# Patient Record
Sex: Female | Born: 1960 | Race: White | Hispanic: No | Marital: Married | Smoking: Former smoker
Health system: Southern US, Community
[De-identification: ages and names within clinical notes are randomized; demographics above are authoritative.]

## PROBLEM LIST (undated history)

## (undated) DIAGNOSIS — M549 Dorsalgia, unspecified: Secondary | ICD-10-CM

## (undated) DIAGNOSIS — J45909 Unspecified asthma, uncomplicated: Secondary | ICD-10-CM

## (undated) DIAGNOSIS — J449 Chronic obstructive pulmonary disease, unspecified: Secondary | ICD-10-CM

## (undated) DIAGNOSIS — I6529 Occlusion and stenosis of unspecified carotid artery: Secondary | ICD-10-CM

## (undated) DIAGNOSIS — E039 Hypothyroidism, unspecified: Secondary | ICD-10-CM

## (undated) DIAGNOSIS — I10 Essential (primary) hypertension: Secondary | ICD-10-CM

## (undated) DIAGNOSIS — E785 Hyperlipidemia, unspecified: Secondary | ICD-10-CM

## (undated) DIAGNOSIS — I639 Cerebral infarction, unspecified: Secondary | ICD-10-CM

## (undated) HISTORY — PX: CYST EXCISION: SHX5701

## (undated) HISTORY — PX: CARPAL TUNNEL RELEASE: SHX101

## (undated) HISTORY — PX: CARDIAC CATHETERIZATION: SHX172

## (undated) HISTORY — DX: Occlusion and stenosis of unspecified carotid artery: I65.29

## (undated) HISTORY — PX: CAROTID ENDARTERECTOMY: SUR193

---

## 2009-11-19 ENCOUNTER — Ambulatory Visit (HOSPITAL_COMMUNITY): Admission: RE | Admit: 2009-11-19 | Discharge: 2009-11-19 | Payer: Self-pay | Admitting: Orthopedic Surgery

## 2011-03-17 LAB — COMPREHENSIVE METABOLIC PANEL
Albumin: 3.9 g/dL (ref 3.5–5.2)
Alkaline Phosphatase: 76 U/L (ref 39–117)
BUN: 7 mg/dL (ref 6–23)
CO2: 24 mEq/L (ref 19–32)
Chloride: 103 mEq/L (ref 96–112)
Creatinine, Ser: 0.76 mg/dL (ref 0.4–1.2)
GFR calc non Af Amer: >60 mL/min (ref >60)
Glucose, Bld: 88 mg/dL (ref 70–99)
Total Bilirubin: 0.9 mg/dL (ref 0.3–1.2)

## 2011-03-17 LAB — URINALYSIS, ROUTINE W REFLEX MICROSCOPIC
Bilirubin Urine: NEGATIVE
Hgb urine dipstick: NEGATIVE
Ketones, ur: NEGATIVE mg/dL
Nitrite: NEGATIVE
Urobilinogen, UA: 0.2 mg/dL (ref 0.0–1.0)

## 2011-03-17 LAB — URINE CULTURE

## 2011-03-17 LAB — DIFFERENTIAL
Basophils Absolute: 0.1 10*3/uL (ref 0.0–0.1)
Basophils Relative: 1 % (ref 0–1)
Eosinophils Absolute: 0.2 10*3/uL (ref 0.0–0.7)
Neutro Abs: 5.4 10*3/uL (ref 1.7–7.7)
Neutrophils Relative %: 67 % (ref 43–77)

## 2011-03-17 LAB — URINE MICROSCOPIC-ADD ON

## 2011-03-17 LAB — PROTIME-INR: INR: 0.94 (ref 0.00–1.49)

## 2011-03-17 LAB — CBC
HCT: 42.4 % (ref 36.0–46.0)
Hemoglobin: 14.4 g/dL (ref 12.0–15.0)
RBC: 4.61 MIL/uL (ref 3.87–5.11)
RDW: 15.6 % — ABNORMAL HIGH (ref 11.5–15.5)

## 2011-03-17 LAB — APTT: aPTT: 36 seconds (ref 24–37)

## 2016-02-03 DIAGNOSIS — J441 Chronic obstructive pulmonary disease with (acute) exacerbation: Secondary | ICD-10-CM | POA: Insufficient documentation

## 2016-02-03 DIAGNOSIS — E890 Postprocedural hypothyroidism: Secondary | ICD-10-CM | POA: Insufficient documentation

## 2016-02-03 DIAGNOSIS — E039 Hypothyroidism, unspecified: Secondary | ICD-10-CM | POA: Diagnosis present

## 2016-02-03 HISTORY — DX: Postprocedural hypothyroidism: E89.0

## 2016-02-03 HISTORY — DX: Chronic obstructive pulmonary disease with (acute) exacerbation: J44.1

## 2016-02-03 HISTORY — DX: Hypothyroidism, unspecified: E03.9

## 2016-07-09 DIAGNOSIS — G4730 Sleep apnea, unspecified: Secondary | ICD-10-CM

## 2016-07-09 DIAGNOSIS — M1990 Unspecified osteoarthritis, unspecified site: Secondary | ICD-10-CM | POA: Insufficient documentation

## 2016-07-09 DIAGNOSIS — R251 Tremor, unspecified: Secondary | ICD-10-CM | POA: Insufficient documentation

## 2016-07-09 DIAGNOSIS — R7309 Other abnormal glucose: Secondary | ICD-10-CM | POA: Insufficient documentation

## 2016-07-09 DIAGNOSIS — Z720 Tobacco use: Secondary | ICD-10-CM | POA: Diagnosis present

## 2016-07-09 DIAGNOSIS — E7849 Other hyperlipidemia: Secondary | ICD-10-CM | POA: Diagnosis present

## 2016-07-09 DIAGNOSIS — G4700 Insomnia, unspecified: Secondary | ICD-10-CM

## 2016-07-09 HISTORY — DX: Tremor, unspecified: R25.1

## 2016-07-09 HISTORY — DX: Unspecified osteoarthritis, unspecified site: M19.90

## 2016-07-09 HISTORY — DX: Other abnormal glucose: R73.09

## 2016-07-09 HISTORY — DX: Insomnia, unspecified: G47.00

## 2016-07-09 HISTORY — DX: Tobacco use: Z72.0

## 2016-07-09 HISTORY — DX: Other hyperlipidemia: E78.49

## 2016-07-09 HISTORY — DX: Sleep apnea, unspecified: G47.30

## 2016-10-12 DIAGNOSIS — F419 Anxiety disorder, unspecified: Secondary | ICD-10-CM

## 2016-10-12 HISTORY — DX: Anxiety disorder, unspecified: F41.9

## 2016-10-23 DIAGNOSIS — I10 Essential (primary) hypertension: Secondary | ICD-10-CM

## 2016-10-23 HISTORY — DX: Essential (primary) hypertension: I10

## 2016-10-29 DIAGNOSIS — B370 Candidal stomatitis: Secondary | ICD-10-CM | POA: Insufficient documentation

## 2016-10-29 DIAGNOSIS — J029 Acute pharyngitis, unspecified: Secondary | ICD-10-CM | POA: Insufficient documentation

## 2016-10-29 HISTORY — DX: Acute pharyngitis, unspecified: J02.9

## 2016-10-29 HISTORY — DX: Candidal stomatitis: B37.0

## 2018-01-19 DIAGNOSIS — R002 Palpitations: Secondary | ICD-10-CM

## 2018-12-14 HISTORY — PX: BREAST BIOPSY: SHX20

## 2018-12-15 ENCOUNTER — Other Ambulatory Visit: Payer: Self-pay | Admitting: Gerontology

## 2018-12-15 DIAGNOSIS — N6310 Unspecified lump in the right breast, unspecified quadrant: Secondary | ICD-10-CM

## 2018-12-15 DIAGNOSIS — N6489 Other specified disorders of breast: Secondary | ICD-10-CM

## 2018-12-15 DIAGNOSIS — N6320 Unspecified lump in the left breast, unspecified quadrant: Secondary | ICD-10-CM

## 2018-12-28 ENCOUNTER — Ambulatory Visit
Admission: RE | Admit: 2018-12-28 | Discharge: 2018-12-28 | Disposition: A | Discharge status: Home / Self Care | Payer: Medicare Other | Source: Ambulatory Visit | Attending: Gerontology | Admitting: Gerontology

## 2018-12-28 ENCOUNTER — Other Ambulatory Visit: Payer: Self-pay | Admitting: Gerontology

## 2018-12-28 DIAGNOSIS — N6320 Unspecified lump in the left breast, unspecified quadrant: Secondary | ICD-10-CM

## 2018-12-28 DIAGNOSIS — N6489 Other specified disorders of breast: Secondary | ICD-10-CM

## 2018-12-28 DIAGNOSIS — C50412 Malignant neoplasm of upper-outer quadrant of left female breast: Secondary | ICD-10-CM | POA: Insufficient documentation

## 2018-12-28 DIAGNOSIS — N6310 Unspecified lump in the right breast, unspecified quadrant: Secondary | ICD-10-CM

## 2018-12-28 DIAGNOSIS — C50919 Malignant neoplasm of unspecified site of unspecified female breast: Secondary | ICD-10-CM

## 2018-12-28 HISTORY — DX: Malignant neoplasm of upper-outer quadrant of left female breast: C50.412

## 2018-12-28 HISTORY — DX: Malignant neoplasm of unspecified site of unspecified female breast: C50.919

## 2018-12-29 DIAGNOSIS — F17209 Nicotine dependence, unspecified, with unspecified nicotine-induced disorders: Secondary | ICD-10-CM

## 2018-12-29 DIAGNOSIS — J449 Chronic obstructive pulmonary disease, unspecified: Secondary | ICD-10-CM

## 2018-12-29 DIAGNOSIS — I6389 Other cerebral infarction: Secondary | ICD-10-CM

## 2018-12-29 DIAGNOSIS — I10 Essential (primary) hypertension: Secondary | ICD-10-CM

## 2018-12-29 DIAGNOSIS — E039 Hypothyroidism, unspecified: Secondary | ICD-10-CM

## 2018-12-29 DIAGNOSIS — E785 Hyperlipidemia, unspecified: Secondary | ICD-10-CM

## 2018-12-29 DIAGNOSIS — I639 Cerebral infarction, unspecified: Secondary | ICD-10-CM

## 2018-12-29 DIAGNOSIS — F329 Major depressive disorder, single episode, unspecified: Secondary | ICD-10-CM

## 2018-12-30 ENCOUNTER — Inpatient Hospital Stay (HOSPITAL_COMMUNITY)
Admission: AD | Admit: 2018-12-30 | Discharge: 2019-01-05 | DRG: 038 | Disposition: A | Discharge status: Home / Self Care | Payer: Medicare Other | Source: Other Acute Inpatient Hospital | Attending: Internal Medicine | Admitting: Internal Medicine

## 2018-12-30 ENCOUNTER — Encounter (HOSPITAL_COMMUNITY): Payer: Self-pay | Admitting: General Practice

## 2018-12-30 ENCOUNTER — Other Ambulatory Visit: Payer: Self-pay

## 2018-12-30 DIAGNOSIS — R7303 Prediabetes: Secondary | ICD-10-CM | POA: Diagnosis present

## 2018-12-30 DIAGNOSIS — G8191 Hemiplegia, unspecified affecting right dominant side: Secondary | ICD-10-CM | POA: Diagnosis present

## 2018-12-30 DIAGNOSIS — F17210 Nicotine dependence, cigarettes, uncomplicated: Secondary | ICD-10-CM | POA: Diagnosis present

## 2018-12-30 DIAGNOSIS — E890 Postprocedural hypothyroidism: Secondary | ICD-10-CM | POA: Diagnosis present

## 2018-12-30 DIAGNOSIS — Z823 Family history of stroke: Secondary | ICD-10-CM

## 2018-12-30 DIAGNOSIS — D649 Anemia, unspecified: Secondary | ICD-10-CM | POA: Diagnosis present

## 2018-12-30 DIAGNOSIS — I25119 Atherosclerotic heart disease of native coronary artery with unspecified angina pectoris: Secondary | ICD-10-CM | POA: Diagnosis not present

## 2018-12-30 DIAGNOSIS — R4701 Aphasia: Secondary | ICD-10-CM | POA: Diagnosis present

## 2018-12-30 DIAGNOSIS — R001 Bradycardia, unspecified: Secondary | ICD-10-CM | POA: Diagnosis present

## 2018-12-30 DIAGNOSIS — I639 Cerebral infarction, unspecified: Secondary | ICD-10-CM | POA: Diagnosis present

## 2018-12-30 DIAGNOSIS — I252 Old myocardial infarction: Secondary | ICD-10-CM

## 2018-12-30 DIAGNOSIS — E039 Hypothyroidism, unspecified: Secondary | ICD-10-CM | POA: Diagnosis not present

## 2018-12-30 DIAGNOSIS — Z9114 Patient's other noncompliance with medication regimen: Secondary | ICD-10-CM | POA: Diagnosis not present

## 2018-12-30 DIAGNOSIS — I2510 Atherosclerotic heart disease of native coronary artery without angina pectoris: Secondary | ICD-10-CM | POA: Diagnosis present

## 2018-12-30 DIAGNOSIS — I63132 Cerebral infarction due to embolism of left carotid artery: Secondary | ICD-10-CM | POA: Diagnosis present

## 2018-12-30 DIAGNOSIS — Z6841 Body Mass Index (BMI) 40.0 and over, adult: Secondary | ICD-10-CM

## 2018-12-30 DIAGNOSIS — Z79899 Other long term (current) drug therapy: Secondary | ICD-10-CM

## 2018-12-30 DIAGNOSIS — E871 Hypo-osmolality and hyponatremia: Secondary | ICD-10-CM

## 2018-12-30 DIAGNOSIS — F329 Major depressive disorder, single episode, unspecified: Secondary | ICD-10-CM | POA: Diagnosis present

## 2018-12-30 DIAGNOSIS — F419 Anxiety disorder, unspecified: Secondary | ICD-10-CM | POA: Diagnosis present

## 2018-12-30 DIAGNOSIS — I6522 Occlusion and stenosis of left carotid artery: Secondary | ICD-10-CM | POA: Diagnosis not present

## 2018-12-30 DIAGNOSIS — R471 Dysarthria and anarthria: Secondary | ICD-10-CM | POA: Diagnosis present

## 2018-12-30 DIAGNOSIS — N179 Acute kidney failure, unspecified: Secondary | ICD-10-CM

## 2018-12-30 DIAGNOSIS — I6523 Occlusion and stenosis of bilateral carotid arteries: Secondary | ICD-10-CM | POA: Diagnosis not present

## 2018-12-30 DIAGNOSIS — E7439 Other disorders of intestinal carbohydrate absorption: Secondary | ICD-10-CM | POA: Diagnosis present

## 2018-12-30 DIAGNOSIS — R29705 NIHSS score 5: Secondary | ICD-10-CM | POA: Diagnosis present

## 2018-12-30 DIAGNOSIS — I739 Peripheral vascular disease, unspecified: Secondary | ICD-10-CM | POA: Diagnosis present

## 2018-12-30 DIAGNOSIS — I10 Essential (primary) hypertension: Secondary | ICD-10-CM | POA: Diagnosis present

## 2018-12-30 DIAGNOSIS — Z8673 Personal history of transient ischemic attack (TIA), and cerebral infarction without residual deficits: Secondary | ICD-10-CM

## 2018-12-30 DIAGNOSIS — Z01818 Encounter for other preprocedural examination: Secondary | ICD-10-CM

## 2018-12-30 DIAGNOSIS — Z720 Tobacco use: Secondary | ICD-10-CM | POA: Diagnosis present

## 2018-12-30 DIAGNOSIS — I6389 Other cerebral infarction: Secondary | ICD-10-CM | POA: Diagnosis not present

## 2018-12-30 DIAGNOSIS — K219 Gastro-esophageal reflux disease without esophagitis: Secondary | ICD-10-CM | POA: Diagnosis present

## 2018-12-30 DIAGNOSIS — Z833 Family history of diabetes mellitus: Secondary | ICD-10-CM

## 2018-12-30 DIAGNOSIS — J449 Chronic obstructive pulmonary disease, unspecified: Secondary | ICD-10-CM | POA: Diagnosis present

## 2018-12-30 DIAGNOSIS — Z79890 Hormone replacement therapy (postmenopausal): Secondary | ICD-10-CM

## 2018-12-30 DIAGNOSIS — F17209 Nicotine dependence, unspecified, with unspecified nicotine-induced disorders: Secondary | ICD-10-CM | POA: Diagnosis not present

## 2018-12-30 DIAGNOSIS — C50919 Malignant neoplasm of unspecified site of unspecified female breast: Secondary | ICD-10-CM | POA: Diagnosis present

## 2018-12-30 DIAGNOSIS — E7849 Other hyperlipidemia: Secondary | ICD-10-CM | POA: Diagnosis present

## 2018-12-30 DIAGNOSIS — I63232 Cerebral infarction due to unspecified occlusion or stenosis of left carotid arteries: Secondary | ICD-10-CM | POA: Diagnosis not present

## 2018-12-30 HISTORY — DX: Hyperlipidemia, unspecified: E78.5

## 2018-12-30 HISTORY — DX: Chronic obstructive pulmonary disease, unspecified: J44.9

## 2018-12-30 HISTORY — DX: Essential (primary) hypertension: I10

## 2018-12-30 HISTORY — DX: Acute kidney failure, unspecified: N17.9

## 2018-12-30 HISTORY — DX: Cerebral infarction, unspecified: I63.9

## 2018-12-30 HISTORY — DX: Hypothyroidism, unspecified: E03.9

## 2018-12-30 HISTORY — DX: Unspecified asthma, uncomplicated: J45.909

## 2018-12-30 HISTORY — DX: Hypo-osmolality and hyponatremia: E87.1

## 2018-12-30 HISTORY — DX: Dorsalgia, unspecified: M54.9

## 2018-12-30 LAB — CBC WITH DIFFERENTIAL/PLATELET
Abs Immature Granulocytes: 0.09 10*3/uL — ABNORMAL HIGH (ref 0.00–0.07)
Basophils Absolute: 0.1 10*3/uL (ref 0.0–0.1)
Basophils Relative: 1 %
Eosinophils Absolute: 0.1 10*3/uL (ref 0.0–0.5)
Eosinophils Relative: 1 %
HCT: 41 % (ref 36.0–46.0)
Hemoglobin: 13.2 g/dL (ref 12.0–15.0)
Immature Granulocytes: 1 %
Lymphocytes Relative: 19 %
Lymphs Abs: 2 10*3/uL (ref 0.7–4.0)
MCH: 29.7 pg (ref 26.0–34.0)
MCHC: 32.2 g/dL (ref 30.0–36.0)
MCV: 92.3 fL (ref 80.0–100.0)
Monocytes Absolute: 0.6 10*3/uL (ref 0.1–1.0)
Monocytes Relative: 5 %
Neutro Abs: 7.5 10*3/uL (ref 1.7–7.7)
Neutrophils Relative %: 73 %
Platelets: 249 10*3/uL (ref 150–400)
RBC: 4.44 MIL/uL (ref 3.87–5.11)
RDW: 14.6 % (ref 11.5–15.5)
WBC: 10.2 10*3/uL (ref 4.0–10.5)
nRBC: 0 % (ref 0.0–0.2)

## 2018-12-30 LAB — COMPREHENSIVE METABOLIC PANEL
ALT: 21 U/L (ref 0–44)
AST: 24 U/L (ref 15–41)
Albumin: 3.4 g/dL — ABNORMAL LOW (ref 3.5–5.0)
Alkaline Phosphatase: 54 U/L (ref 38–126)
Anion gap: 10 (ref 5–15)
BUN: 19 mg/dL (ref 6–20)
CO2: 24 mmol/L (ref 22–32)
Calcium: 8.7 mg/dL — ABNORMAL LOW (ref 8.9–10.3)
Chloride: 98 mmol/L (ref 98–111)
Creatinine, Ser: 1.08 mg/dL — ABNORMAL HIGH (ref 0.44–1.00)
GFR calc Af Amer: >60 mL/min (ref >60)
GFR calc non Af Amer: 57 mL/min — ABNORMAL LOW (ref >60)
Glucose, Bld: 99 mg/dL (ref 70–99)
Potassium: 4.3 mmol/L (ref 3.5–5.1)
Sodium: 132 mmol/L — ABNORMAL LOW (ref 135–145)
Total Bilirubin: 0.6 mg/dL (ref 0.3–1.2)
Total Protein: 7.1 g/dL (ref 6.5–8.1)

## 2018-12-30 LAB — PROTIME-INR
INR: 1.02
Prothrombin Time: 13.3 seconds (ref 11.4–15.2)

## 2018-12-30 LAB — GLUCOSE, CAPILLARY: Glucose-Capillary: 107 mg/dL — ABNORMAL HIGH (ref 70–99)

## 2018-12-30 LAB — APTT: aPTT: 32 seconds (ref 24–36)

## 2018-12-30 MED ORDER — ACETAMINOPHEN 325 MG PO TABS: 650.0000 mg | ORAL_TABLET | ORAL | Status: DC | PRN

## 2018-12-30 MED ORDER — PANTOPRAZOLE SODIUM 40 MG PO TBEC
40.0000 mg | DELAYED_RELEASE_TABLET | Freq: Every day | ORAL | Status: DC
  Administered 2018-12-30 – 2019-01-03 (×5): 40 mg via ORAL
  Filled 2018-12-30 (×5): qty 1

## 2018-12-30 MED ORDER — STROKE: EARLY STAGES OF RECOVERY BOOK
Freq: Once | Status: AC
  Administered 2018-12-31: 08:00:00
  Filled 2018-12-30: qty 1

## 2018-12-30 MED ORDER — SENNOSIDES-DOCUSATE SODIUM 8.6-50 MG PO TABS
1.0000 | ORAL_TABLET | Freq: Every evening | ORAL | Status: DC | PRN
  Administered 2019-01-01 – 2019-01-05 (×2): 1 via ORAL
  Filled 2018-12-30 (×2): qty 1

## 2018-12-30 MED ORDER — ASPIRIN EC 325 MG PO TBEC
325.0000 mg | DELAYED_RELEASE_TABLET | Freq: Every day | ORAL | Status: DC
  Administered 2018-12-30 – 2019-01-03 (×5): 325 mg via ORAL
  Filled 2018-12-30 (×5): qty 1

## 2018-12-30 MED ORDER — SODIUM CHLORIDE 0.9 % IV SOLN
INTRAVENOUS | Status: DC
  Administered 2018-12-31 – 2019-01-03 (×5): via INTRAVENOUS

## 2018-12-30 MED ORDER — FAMOTIDINE 20 MG PO TABS
40.0000 mg | ORAL_TABLET | Freq: Every day | ORAL | Status: DC
  Administered 2018-12-30 – 2019-01-04 (×6): 40 mg via ORAL
  Filled 2018-12-30 (×6): qty 2

## 2018-12-30 MED ORDER — OXYCODONE-ACETAMINOPHEN 7.5-325 MG PO TABS
1.0000 | ORAL_TABLET | Freq: Four times a day (QID) | ORAL | Status: DC | PRN
  Administered 2019-01-04 – 2019-01-05 (×3): 1 via ORAL
  Filled 2018-12-30 (×3): qty 1

## 2018-12-30 MED ORDER — ATORVASTATIN CALCIUM 80 MG PO TABS
80.0000 mg | ORAL_TABLET | Freq: Every day | ORAL | Status: DC
  Administered 2018-12-30 – 2019-01-05 (×6): 80 mg via ORAL
  Filled 2018-12-30 (×6): qty 1

## 2018-12-30 MED ORDER — SODIUM CHLORIDE 0.9 % IV SOLN
INTRAVENOUS | Status: AC
  Administered 2018-12-30: 21:00:00 via INTRAVENOUS

## 2018-12-30 MED ORDER — ACETAMINOPHEN 650 MG RE SUPP: 650.0000 mg | RECTAL | Status: DC | PRN

## 2018-12-30 MED ORDER — CITALOPRAM HYDROBROMIDE 20 MG PO TABS
20.0000 mg | ORAL_TABLET | Freq: Every day | ORAL | Status: DC
  Administered 2018-12-31 – 2019-01-05 (×5): 20 mg via ORAL
  Filled 2018-12-30 (×2): qty 2
  Filled 2018-12-30: qty 1
  Filled 2018-12-30 (×2): qty 2

## 2018-12-30 MED ORDER — ENOXAPARIN SODIUM 40 MG/0.4ML ~~LOC~~ SOLN
40.0000 mg | SUBCUTANEOUS | Status: DC
  Administered 2018-12-30 – 2019-01-02 (×4): 40 mg via SUBCUTANEOUS
  Filled 2018-12-30 (×4): qty 0.4

## 2018-12-30 MED ORDER — CELECOXIB 200 MG PO CAPS
200.0000 mg | ORAL_CAPSULE | Freq: Two times a day (BID) | ORAL | Status: DC
  Administered 2018-12-30 – 2019-01-03 (×8): 200 mg via ORAL
  Filled 2018-12-30 (×8): qty 1

## 2018-12-30 MED ORDER — MONTELUKAST SODIUM 10 MG PO TABS
10.0000 mg | ORAL_TABLET | Freq: Every day | ORAL | Status: DC
  Administered 2018-12-30 – 2019-01-05 (×6): 10 mg via ORAL
  Filled 2018-12-30 (×6): qty 1

## 2018-12-30 MED ORDER — LEVOTHYROXINE SODIUM 25 MCG PO TABS
25.0000 ug | ORAL_TABLET | Freq: Every day | ORAL | Status: DC
  Administered 2018-12-30 – 2019-01-05 (×7): 25 ug via ORAL
  Filled 2018-12-30 (×7): qty 1

## 2018-12-30 MED ORDER — ACETAMINOPHEN 160 MG/5ML PO SOLN: 650.0000 mg | ORAL | Status: DC | PRN

## 2018-12-30 NOTE — Progress Notes (Signed)
Patient arrived via care link. In bed with side rails up, alarm on, and family in the room. Oriented her to the room and unit.

## 2018-12-30 NOTE — Consult Note (Addendum)
NEURO HOSPITALIST  CONSULT   Requesting Physician: Dr. Steffanie Dunn    Chief Complaint: Stroke  History obtained from:  Family   HPI:                                                                                                                                         Madison Medina is an 58 y.o. female  With PMH HTN, MI, HLD, COPD, asthma, hyperthyroidism (had radiation now hypo) presented to [facility] ED for right arm numbness and weakness. Patient transferred to [facility] for stroke on MRI.  Per family, the patient had a breast biopsy Wednesday and has not been acting right since then. The patient presented to [place] ED for right arm numbness and weakness that started 12/28/2018 about 7:30 or 8 am. Around 9:30 am on 12/29/2018 she developed slurred speech which resolved, but numbness and weakness persisted. Denies CP, SOB, vision changes. Endorses smoking.  Patient is on no anticoagulation and family states she was not taking ASA prior to hospitalization.   Hospital course (from OSH chart):  CTH: no hemorrhage; mild chronic cerebral white matter changes Chest x-ray: unremarkeable MRI: Patchy acute infarcts left cerebral hemisphere with involvement of superficial and deep watershed zones. New abnormal appearance of the left ICA. 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 Right proximal ICA severe 90% stenosis with mixed plaque. Anterior left vertebral artery origin moderate 50% stenosis.  Patent diminutive right vertebral artery.  Diminutive left ICA downstream to bifurcation, stenosis likely due to chronic slow flow. Patent anterior and posterior intracranial circulation.  No large vessel occlusion, aneurysm or significant stenosis is identified.   Date last known well: 12/28/2018 Time last known well:0800 tPA Given: No: outside of window  Modified Rankin: Rankin Score=0 NIHSS:5    Past  Medical History:  Diagnosis Date  . Asthma   . Back pain   . COPD (chronic obstructive pulmonary disease) ([place])   . Hyperlipidemia   . Hypertension   . Hypothyroidism   . Stroke [facility])     Past Surgical History:  Procedure Laterality Date  . BREAST BIOPSY  12/2018   at breast center in [place]   . CARPAL TUNNEL RELEASE    . CYST EXCISION      History reviewed. No pertinent family history.       Social History:  reports that she has been smoking cigarettes. She has a 80.00 pack-year smoking history. She has never used smokeless tobacco. She reports previous alcohol use. She reports that she does not use drugs.  Allergies: Not on File  Medications:  Scheduled: .  stroke: mapping our early stages of recovery book   Does not apply Once  . atorvastatin  80 mg Oral Daily  . celecoxib  200 mg Oral BID  . citalopram  20 mg Oral Daily  . enoxaparin (LOVENOX) injection  40 mg Subcutaneous Q24H  . famotidine  40 mg Oral QHS  . [START ON 12/31/2018] levothyroxine  25 mcg Oral Q0600  . montelukast  10 mg Oral Daily  . pantoprazole  40 mg Oral Daily   Continuous: . sodium chloride     NTI:[id number] **OR** acetaminophen (TYLENOL) oral liquid 160 mg/5 mL **OR** acetaminophen, oxyCODONE-acetaminophen, senna-docusate   ROS:                                                                                                                                        unobtainable from patient due to mental status   General Examination:                                                                                                      Blood pressure (!) 119/56, pulse (!) 55, temperature 97.6 F (36.4 C), temperature source Oral, resp. rate 17, SpO2 95 %.  HEENT-  Normocephalic, no lesions, without obvious abnormality.  Normal external eye and  conjunctiva. Cardiovascular- S1-S2 audible, pulses palpable throughout  Lungs-no rhonchi or wheezing noted, no excessive working breathing.  Saturations within normal limits on RA Abdomen- All 4 quadrants palpated and non tender Extremities- Warm, dry and intact Musculoskeletal-no joint tenderness, deformity or swelling Skin-warm and dry, intact. Bruise on right arm  Neurological Examination Mental Status: Alert, patient answers anything asked with "yes" or "no".   Able to follow some simple commands. Cranial Nerves: II: Blinks to threat bilaterally. PERRL III,IV, VI: Ptosis not present, EOMI  V,VII: Smile symmetric, facial light touch sensation normal bilaterally VIII: hearing intact to voice XI: Head midline XII: midline tongue extension Motor: Right : Upper extremity   4/5  Left:     Upper extremity   5/5  Lower extremity   5/5   Lower extremity   5/5 Tone and bulk:normal tone throughout; no atrophy noted Sensory:light touch intact throughout, bilaterally Deep Tendon Reflexes: 2+ and symmetric biceps and patellae Plantars: Right: downgoing   Left: downgoing Cerebellar: Unable to follow directions to do FNF or HTS Gait: Deferred   Lab Results: Basic Metabolic Panel: No results for input(s): NA, K, CL, CO2, GLUCOSE, BUN, CREATININE, CALCIUM, MG, PHOS in the  last 168 hours.  CBC: No results for input(s): WBC, NEUTROABS, HGB, HCT, MCV, PLT in the last 168 hours.  Lipid Panel: No results for input(s): CHOL, TRIG, HDL, CHOLHDL, VLDL, LDLCALC in the last 168 hours.  CBG: Recent Labs  Lab 12/30/18 1155  GLUCAP 107*    Imaging: No results found.     Laurey Morale, MSN, NP-C [facility] [phone] 12/30/2018, 3:25 PM    Assessment: 58 y.o. female with PMHx HTN, MI, HLD, COPD, asthma and hyperthyroidism (had radiation now hypo) who presented to [facility] ED for right arm numbness and weakness.  1. CTH: negative for hemorrhage.  2. CTA head and neck: no  LVO. Multifocal stenoses noted 3. MRI: Acute infarcts left cerebral hemisphere, with involvement of superficial and deep watershed zones.  4. TPA not given d/t patient presenting outside of TPA window. Not an endovascular candidate due to no LVO.  5. Stroke Risk Factors - hyperlipidemia, hypertension and smoking    Recommendations: -- BP goal : Permissive HTN upto 220/110 mmHg x 24 hours then decrease by 15% per day to SBP goal of 120-140 -- MRI Brain was completed at OSH -- CTA head and neck was completed at OSH -- TTE -- ASA 325 mg po qd, first dose now -- High intensity statin -- HgbA1c, fasting lipid panel -- PT consult, OT consult, Speech consult -- Telemetry monitoring -- Frequent neuro checks -- Stroke swallow screen  -- Smoking cessation  -- Please page stroke NP  Or  PA  Or MD from 8am -4 pm  as this patient from this time will be  followed by the stroke.   You can look them up on [email or web address]  Password [email or web address]  I have seen and examined the patient. I have formulated the assessment and plan. Electronically signed: Dr. Kerney Elbe

## 2018-12-30 NOTE — H&P (Signed)
History and Physical    Madison Medina SHF:[id number] DOB: 1961/03/14 DOA: 12/30/2018  PCP: Welford Roche, NP Consultants:  Neurology Patient coming from: home- lives with husband  Chief Complaint: Stroke  HPI: Madison Medina is a 58 y.o. female with medical history significant for HTN, obesity, HLD, hypothyroidism, COPD and 87 pack-year smoking hx (current smoker) who was transferred to [facility] today from [place] for acute CVA. The morning of Jan 15 she had a breast biopsy at [facility] and around that time (~0800) she had the acute onset of RUE tingling and numbness and shortly thereafter developed RLE weakness. The next morning around 0900 family noticed that pt was mumbling and had garbled speech. Patient herself was unaware she was speaking differently than usual and she was able to understand what people were saying to her. However later on in the day she became confused and was aware of it and felt she needed to go to the ED.   ED Course:  At [facility], she was afebrile, HR 68, RR 18, 97% on RA. She was hypertensive at 189/88. Dysarthria and confusion had resolved but R arm weakness remained. Pt was able to answer "yes" or "no" to questions but otherwise unable to speak. Labs were remarkabel for hyponatremia at 132, slight increase in baseline creatinine at 1.1 (baseline appears to be ~0.7 - 0.8). Labs o/w were unremarkable. Head CT was negative but brain MRI revealed patchy acute infarcts in the L cerebral hemisphere with involvement of the superficial and deep watershed zones, new abnormal appearance of the L ICA. CTA head/neck showed near occlusion of L proximal ICA with severe thread-like stenosis with mixed plaque. R prox ICA had severe 90% stenosis with mixed plaque. tPA was not given due to pt being out of the window. She was given ASA 325 mg and teleneurology consult was obtained. Decision was made to transfer [facility] to [facility].  Review of Systems: As per HPI; otherwise review of systems reviewed and  negative.   Ambulatory Status:  Ambulates without assistance  Past Medical History:  Diagnosis Date  . Asthma   . Back pain   . COPD (chronic obstructive pulmonary disease) ([place])   . Hyperlipidemia   . Hypertension   . Hypothyroidism   . Stroke [facility])     Past Surgical History:  Procedure Laterality Date  . BREAST BIOPSY  12/2018   at breast center in [place]  . CARPAL TUNNEL RELEASE    . CYST EXCISION      Social History   Socioeconomic History  . Marital status: Married    Spouse name: Not on file  . Number of children: Not on file  . Years of education: Not on file  . Highest education level: Not on file  Occupational History  . Not on file  Social Needs  . Financial resource strain: Not on file  . Food insecurity:    Worry: Not on file    Inability: Not on file  . Transportation needs:    Medical: Not on file    Non-medical: Not on file  Tobacco Use  . Smoking status: Current Every Day Smoker    Packs/day: 2.00    Years: 40.00    Pack years: 80.00    Types: Cigarettes  . Smokeless tobacco: Never Used  Substance and Sexual Activity  . Alcohol use: Not Currently    Frequency: Never  . Drug use: Never  . Sexual activity: Not on file  Lifestyle  . Physical activity:  Days per week: Not on file    Minutes per session: Not on file  . Stress: Not on file  Relationships  . Social connections:    Talks on phone: Not on file    Gets together: Not on file    Attends religious service: Not on file    Active member of club or organization: Not on file    Attends meetings of clubs or organizations: Not on file    Relationship status: Not on file  . Intimate partner violence:    Fear of current or ex partner: Not on file    Emotionally abused: Not on file    Physically abused: Not on file    Forced sexual activity: Not on file  Other Topics Concern  . Not on file  Social History Narrative  . Not on file    Not on File  Family History    Problem Relation Age of Onset  . High blood pressure Other   . Diabetes Other   . Cancer Other   . Stroke Other   . Heart disease Other     Prior to Admission medications   Medication Sig Start Date End Date Taking? Authorizing Provider  levothyroxine (SYNTHROID, LEVOTHROID) 200 MCG tablet Take 200 mcg by mouth daily before breakfast. (in addition to the 42mcg tablet)   Yes [provider]  amLODipine (NORVASC) 5 MG tablet Take 5 mg by mouth daily.  09/30/17   [provider]  atorvastatin (LIPITOR) 80 MG tablet Take 80 mg by mouth daily at 6 PM.  05/20/17   [provider]  celecoxib (CELEBREX) 200 MG capsule Take 200 mg by mouth 2 (two) times daily.  01/27/17   [provider]  citalopram (CELEXA) 20 MG tablet Take 20 mg by mouth daily. 12/12/18   [provider]  famotidine (PEPCID) 40 MG tablet Take 40 mg by mouth at bedtime. 12/12/18   [provider]  hydrochlorothiazide (HYDRODIURIL) 25 MG tablet Take 25 mg by mouth daily.  08/31/17   [provider]  levothyroxine (SYNTHROID, LEVOTHROID) 25 MCG tablet Take 25 mcg by mouth daily before breakfast. (in addition to the 277mcg tablet) 12/19/18   [provider]  lisinopril (PRINIVIL,ZESTRIL) 20 MG tablet Take 20 mg by mouth 2 (two) times daily.     [provider]  montelukast (SINGULAIR) 10 MG tablet Take 10 mg by mouth at bedtime.  02/05/17   [provider]  omeprazole (PRILOSEC) 40 MG capsule Take 40 mg by mouth daily. 12/12/18   [provider]  oxyCODONE-acetaminophen (PERCOCET) 7.5-325 MG tablet Take 1 tablet by mouth 3 (three) times daily.  07/09/16   [provider]    Physical Exam: Vitals:   12/30/18 1157 12/30/18 1643 12/30/18 2030 12/30/18 2048  BP: (!) 119/56 (!) 120/56  130/63  Pulse: (!) 55 61 (!) 58 (!) 59  Resp: 17  18 20   Temp: 97.6 F (36.4 C) 97.8 F (36.6 C)  98.1 F (36.7 C)  TempSrc: Oral Oral  Oral   SpO2: 95% 94% 95% 98%     . General: Obese female, appears older than stated age, appears mildly anxious . Eyes:  PERRL, EOMI, normal lids, iris . ENT:  grossly normal hearing, lips & tongue, mmm . Neck:  supple, no lymphadenopathy . Cardiovascular:  nL S1, S2, normal rate, reg rhythm, no murmur. Marland Kitchen Respiratory:   CTA bilaterally (although diminished throughout) with no wheezes/rales/rhonchi.  Normal respiratory effort. . Abdomen:  soft, NT, ND, NABS . Back:   grossly normal alignment . Skin:  no rash or lesions seen on limited exam . Musculoskeletal:  grossly normal tone BUE/BLE, good ROM, no bony abnormality or obvious joint deformity . Lower extremities:  Trace BLE edema.  Limited foot exam with no ulcerations.  2+ distal pulses. Marland Kitchen Psychiatric:  grossly normal mood and affect, speech fluent and appropriate, AOx3 . Neurologic:  Answers all questions with "no" including "Is your name Alyx Mcguirk?" She is alert. She is able to follow most commands. Smile mildly asymmetric. Strength testing WNL except for 4/5 in RUE and grip. CN2-12 otherwise grossly intact. DTRs 2+ symmetric. Plantar reflexes downgoing bilaterally. Unable to test FNF/HTS due to unable to follow directions. Gait not assessed.    Radiological Exams on Admission: See HPI   EKG: N/A   Labs on Admission: I have personally reviewed the available labs and imaging studies at the time of the admission.  Pertinent labs:  Na 132 K 4.4 Cl 96 CO2 28 BUN 12 Creat 1.1 Gluc 101  WBC 9.5 Hgb 14.4 Plt 287 INR 1.0 PTT 29.6  TnI <0.01 Pro-BNP 63 (nL)  U/A unremarkable   Assessment/Plan Principal Problem:   CVA (cerebral vascular accident) ([place]) Active Problems:   HTN (hypertension)   Anxiety   Hypothyroid   Familial combined hyperlipidemia   Tobacco use   COPD (chronic obstructive pulmonary disease) ([facility])   AKI (acute kidney injury) ([place])   Hyponatremia   Acute ischemic CVA: RFs - HTN, HLD, active  smoker -admit to telemetry -appreciate neurology assistance -MRI, head/neck CTA done as well as carotid dopplers at OSH -TTE done at [facility], results have not been reported -ASA 325 mg daily -Cont lipitor 80 mg daily -HbA1C, lipid panel ordered -permissive HTN -PT/OT/ST -frequent neuro checks -stroke swallow screen -smoking cessation counseling  AKI: likely prerenal. Creatinine is 1.1, most recent baseline ~0.75 -normal saline at 125 cc/h -BMP in a.m. -avoid nephrotoxins -dose all meds for GFR  Hyponatremia: likely hypovolemic -IVF as above -monitor Na  HTN -permissive HTN, hold home BP meds for now  HLD -cont atorvastatin 80 mg  Hypothyroidism -cont home levothyroxine  Anxety -cont home citalopram  COPD -cont singulair, nebs prn    DVT prophylaxis: lovenox Code Status:  Full - confirmed with patient/family Family Communication: several family members in room including husband, children  Disposition Plan: TBD once clinically improved Consults called: neuro  Admission status: Admit - It is my clinical opinion that admission to INPATIENT is reasonable and necessary because of the expectation that this patient will require hospital care that crosses at least 2 midnights to treat this condition based on the medical complexity of the problems presented.  Given the aforementioned information, the predictability of an adverse outcome is felt to be significant.     Janora Norlander MD [facility]  If note is complete, please contact covering daytime or nighttime physician. [email or web address] Password [email or web address]  12/30/2018, 10:02 PM

## 2018-12-31 ENCOUNTER — Inpatient Hospital Stay (HOSPITAL_COMMUNITY): Payer: Medicare Other

## 2018-12-31 ENCOUNTER — Other Ambulatory Visit (HOSPITAL_COMMUNITY): Payer: Medicare Other

## 2018-12-31 DIAGNOSIS — I6523 Occlusion and stenosis of bilateral carotid arteries: Secondary | ICD-10-CM

## 2018-12-31 DIAGNOSIS — E7849 Other hyperlipidemia: Secondary | ICD-10-CM

## 2018-12-31 LAB — LIPID PANEL
Cholesterol: 245 mg/dL — ABNORMAL HIGH (ref 0–200)
HDL: 39 mg/dL — ABNORMAL LOW (ref >40)
LDL Cholesterol: 183 mg/dL — ABNORMAL HIGH (ref 0–99)
Total CHOL/HDL Ratio: 6.3 RATIO
Triglycerides: 117 mg/dL (ref <150)
VLDL: 23 mg/dL (ref 0–40)

## 2018-12-31 LAB — BASIC METABOLIC PANEL
Anion gap: 11 (ref 5–15)
BUN: 21 mg/dL — ABNORMAL HIGH (ref 6–20)
CO2: 24 mmol/L (ref 22–32)
Calcium: 8.6 mg/dL — ABNORMAL LOW (ref 8.9–10.3)
Chloride: 98 mmol/L (ref 98–111)
Creatinine, Ser: 0.99 mg/dL (ref 0.44–1.00)
GFR calc Af Amer: >60 mL/min (ref >60)
GFR calc non Af Amer: >60 mL/min (ref >60)
Glucose, Bld: 90 mg/dL (ref 70–99)
Potassium: 4.1 mmol/L (ref 3.5–5.1)
Sodium: 133 mmol/L — ABNORMAL LOW (ref 135–145)

## 2018-12-31 LAB — CBC
HCT: 39.5 % (ref 36.0–46.0)
Hemoglobin: 13.2 g/dL (ref 12.0–15.0)
MCH: 31.2 pg (ref 26.0–34.0)
MCHC: 33.4 g/dL (ref 30.0–36.0)
MCV: 93.4 fL (ref 80.0–100.0)
Platelets: 286 10*3/uL (ref 150–400)
RBC: 4.23 MIL/uL (ref 3.87–5.11)
RDW: 14.6 % (ref 11.5–15.5)
WBC: 10.9 10*3/uL — ABNORMAL HIGH (ref 4.0–10.5)
nRBC: 0 % (ref 0.0–0.2)

## 2018-12-31 LAB — HIV ANTIBODY (ROUTINE TESTING W REFLEX): HIV Screen 4th Generation wRfx: NONREACTIVE

## 2018-12-31 LAB — HEMOGLOBIN A1C
Hgb A1c MFr Bld: 5.7 % — ABNORMAL HIGH (ref 4.8–5.6)
Mean Plasma Glucose: 116.89 mg/dL

## 2018-12-31 MED ORDER — IOPAMIDOL (ISOVUE-370) INJECTION 76%
INTRAVENOUS | Status: AC
  Administered 2018-12-31: 75 mL
  Filled 2018-12-31: qty 100

## 2018-12-31 NOTE — Progress Notes (Deleted)
PT Cancellation Note  Patient Details Name: Madison Medina MRN: [id number] DOB: 02-06-1961   Cancelled Treatment:    Reason Eval/Treat Not Completed: Patient at procedure or test/unavailable. Pt off the floor for MRI. PT will continue to follow acutely as available.    [place] 12/31/2018, 11:57 AM

## 2018-12-31 NOTE — Progress Notes (Signed)
Rehab Admissions Coordinator Note:  Per OT and SLP recommendation, this patient was screened by Jhonnie Garner for appropriateness for an Inpatient Acute Rehab Consult.  At this time, we are recommending an Inpatient Rehab consult. AC will contact MD regarding request for consult.   Jhonnie Garner 12/31/2018, 2:26 PM  I can be reached at [phone].

## 2018-12-31 NOTE — Evaluation (Signed)
Physical Therapy Evaluation Patient Details Name: Madison Medina MRN: [id number] DOB: 1961-11-11 Today's Date: 12/31/2018   History of Present Illness  Madison Medina is an 58 y.o. female  With PMH HTN, MI, HLD, COPD, asthma, hyperthyroidism (had radiation now hypo) presented to [facility] ED for right arm numbness and weakness. 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 New abnormal appearance of the left ICA. Breast biopsy 12/28/2018 with family reporting positive for cancer.    Clinical Impression  Pt presented supine in bed with HOB elevated, awake and willing to participate in therapy session. Prior to admission, pt was independent with all functional mobility and ADLs. Pt lives with her spouse in a single level home with a level entry. She has family support 24/7 if needed. Pt currently at min guard level overall for transfers and short distance ambulation. Pt would benefit from further intensive therapy services at CIR to maximize her functional mobility prior to returning home with family support.     Follow Up Recommendations CIR    Equipment Recommendations  None recommended by PT    Recommendations for Other Services       Precautions / Restrictions Precautions Precautions: Fall Precaution Comments: place gait belt above breasts (Bil biopsies 12/28/2018 at under side and lower part of breasts) Restrictions Weight Bearing Restrictions: No      Mobility  Bed Mobility Overal bed mobility: Needs Assistance Bed Mobility: Supine to Sit;Sit to Supine     Supine to sit: Supervision;HOB elevated Sit to supine: Supervision   General bed mobility comments: supervision for safety  Transfers Overall transfer level: Needs assistance Equipment used: None Transfers: Sit to/from Stand Sit to Stand: Min guard         General transfer comment: min guard for safety  Ambulation/Gait Ambulation/Gait assistance: Min  guard Gait Distance (Feet): 75 Feet Assistive device: None Gait Pattern/deviations: Step-through pattern;Decreased step length - right;Decreased step length - left;Decreased stride length;Drifts right/left Gait velocity: decreased   General Gait Details: pt with mild instability but no overt LOB or need for physical assistance; pt bumping into objects occasionally on L side  Stairs            Wheelchair Mobility    Modified Rankin (Stroke Patients Only) Modified Rankin (Stroke Patients Only) Pre-Morbid Rankin Score: Slight disability Modified Rankin: Moderate disability     Balance Overall balance assessment: Needs assistance Sitting-balance support: No upper extremity supported;Feet supported Sitting balance-Leahy Scale: Good     Standing balance support: No upper extremity supported Standing balance-Leahy Scale: Fair                               Pertinent Vitals/Pain Pain Assessment: No/denies pain Faces Pain Scale: No hurt    Home Living Family/patient expects to be discharged to:: Private residence Living Arrangements: Spouse/significant other Available Help at Discharge: Family;Available 24 hours/day Type of Home: House Home Access: Level entry     Home Layout: One level Home Equipment: None      Prior Function Level of Independence: Independent               Hand Dominance   Dominant Hand: Right    Extremity/Trunk Assessment   Upper Extremity Assessment Upper Extremity Assessment: Defer to OT evaluation    Lower Extremity Assessment Lower Extremity Assessment: Generalized weakness       Communication   Communication: Expressive difficulties  Cognition Arousal/Alertness: Awake/alert Behavior During Therapy: WFL for tasks assessed/performed Overall Cognitive Status: Impaired/Different from baseline Area of Impairment: Memory;Following commands;Safety/judgement;Problem solving                     Memory:  Decreased short-term memory;Decreased recall of precautions Following Commands: Follows one step commands consistently Safety/Judgement: Decreased awareness of safety;Decreased awareness of deficits   Problem Solving: Difficulty sequencing;Requires verbal cues        General Comments      Exercises     Assessment/Plan    PT Assessment Patient needs continued PT services  PT Problem List Decreased strength;Decreased activity tolerance;Decreased balance;Decreased mobility;Decreased coordination;Decreased cognition;Decreased knowledge of use of DME;Decreased safety awareness;Decreased knowledge of precautions       PT Treatment Interventions DME instruction;Gait training;Stair training;Therapeutic exercise;Functional mobility training;Therapeutic activities;Balance training;Neuromuscular re-education;Cognitive remediation;Patient/family education    PT Goals (Current goals can be found in the Care Plan section)  Acute Rehab PT Goals Patient Stated Goal: to get better PT Goal Formulation: With patient/family Time For Goal Achievement: 01/14/19 Potential to Achieve Goals: Good    Frequency Min 4X/week   Barriers to discharge        Co-evaluation               AM-PAC PT "6 Clicks" Mobility  Outcome Measure Help needed turning from your back to your side while in a flat bed without using bedrails?: None Help needed moving from lying on your back to sitting on the side of a flat bed without using bedrails?: None Help needed moving to and from a bed to a chair (including a wheelchair)?: A Little Help needed standing up from a chair using your arms (e.g., wheelchair or bedside chair)?: A Little Help needed to walk in hospital room?: A Little Help needed climbing 3-5 steps with a railing? : A Lot 6 Click Score: 19    End of Session Equipment Utilized During Treatment: Gait belt(positioned high on chest to avoid breast area) Activity Tolerance: Patient tolerated treatment  well Patient left: in bed;with call bell/phone within reach;with bed alarm set;with family/visitor present Nurse Communication: Mobility status PT Visit Diagnosis: Other abnormalities of gait and mobility (R26.89);Other symptoms and signs involving the nervous system (R29.898)    Time: 1250-1308 PT Time Calculation (min) (ACUTE ONLY): 18 min   Charges:   PT Evaluation $PT Eval Moderate Complexity: 1 Mod          Sherie Don, PT, DPT  Acute Rehabilitation Services Pager [phone] Office [place] 12/31/2018, 3:12 PM

## 2018-12-31 NOTE — Evaluation (Addendum)
Occupational Therapy Evaluation Patient Details Name: Madison Medina MRN: [id number] DOB: [birth year] Today's Date: 12/31/2018    History of Present Illness Madison Medina is an 58 y.o. female  With PMH HTN, MI, HLD, COPD, asthma, hyperthyroidism (had radiation now hypo) presented to [facility] ED for right arm numbness and weakness. 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 New abnormal appearance of the left ICA. Breast biopsy 12/28/2018 with family reporting positive for cancer.   Clinical Impression   This 58 yo female admitted with above presents to acute OT with decreased movement of RUE/RLE, dizziness with visual tracking, decreased communication (expressive difficulties), decreased safety awareness, decreased attention to right UE all affecting her PLOF of being independent with basic ADLs. She will benefit from acute OT with follow up OT on CIR to work back towards PLOF and return home with husband and other family.  137/69 (83)  HR 62  (pt c/o dizziness when asked while seated on toilet) 141/63 (84)  HR 63 (pt c/o dizziness when asked while ambulating back to bed from bathroom)     Follow Up Recommendations  CIR;Supervision/Assistance - 24 hour    Equipment Recommendations  Other (comment)(TBD next venue)       Precautions / Restrictions Precautions Precautions: Fall Precaution Comments: place gait belt above breasts (Bil biopsies 12/28/2018 at under side and lower part of breasts) Restrictions Weight Bearing Restrictions: No      Mobility Bed Mobility Overal bed mobility: Needs Assistance Bed Mobility: Supine to Sit;Sit to Supine     Supine to sit: Supervision;HOB elevated Sit to supine: Supervision      Transfers Overall transfer level: Needs assistance Equipment used: 1 person hand held assist Transfers: Sit to/from Stand Sit to Stand: Min assist              Balance Overall balance  assessment: Needs assistance Sitting-balance support: No upper extremity supported;Feet supported Sitting balance-Leahy Scale: Fair     Standing balance support: Single extremity supported Standing balance-Leahy Scale: Poor Standing balance comment: She felt the need to "furntiture walk" to and from bathroom                           ADL either performed or assessed with clinical judgement   ADL Overall ADL's : Needs assistance/impaired Eating/Feeding: Moderate assistance;Sitting Eating/Feeding Details (indicate cue type and reason): with use of dominant RUE Grooming: Maximal assistance;Sitting Grooming Details (indicate cue type and reason): with use of dominant RUE Upper Body Bathing: Moderate assistance;Sitting   Lower Body Bathing: Moderate assistance Lower Body Bathing Details (indicate cue type and reason): min  A sit<>stand Upper Body Dressing : Maximal assistance;Sitting       Toilet Transfer: Maximal assistance Toilet Transfer Details (indicate cue type and reason): min A sit<>stand Toileting- Clothing Manipulation and Hygiene: Moderate assistance Toileting - Clothing Manipulation Details (indicate cue type and reason): min A sit<>stand             Vision Baseline Vision/History: No visual deficits Patient Visual Report: No change from baseline Vision Assessment?: Yes Eye Alignment: Within Functional Limits Ocular Range of Motion: Within Functional Limits Alignment/Gaze Preference: Within Defined Limits Tracking/Visual Pursuits: Able to track stimulus in all quads without difficulty Visual Fields: No apparent deficits Additional Comments: Pt did report feeling dizzy with tracking            Pertinent Vitals/Pain Pain Assessment: No/denies pain  Hand Dominance Right   Extremity/Trunk Assessment Upper Extremity Assessment Upper Extremity Assessment: RUE deficits/detail RUE Deficits / Details: Has movement in all joints but cannot  coordinate it and with decreased strength. Can with increased effort get hand 1/3 way up to chin while seated EOB, can hold cup in hand but not maintain hold consistently, can squeeze my hand RUE Sensation: decreased light touch(pt consistently reports arm feels numb when asked) RUE Coordination: decreased fine motor;decreased gross motor   Lower Extremity Assessment Lower Extremity Assessment: Defer to PT evaluation       Communication Communication Communication: No difficulties   Cognition Arousal/Alertness: Awake/alert Behavior During Therapy: Flat affect(occassional laughing when she couldn't get words out) Overall Cognitive Status: Impaired/Different from baseline Area of Impairment: Safety/judgement;Problem solving                         Safety/Judgement: Decreased awareness of safety;Decreased awareness of deficits   Problem Solving: Requires verbal cues;Requires tactile cues;Difficulty sequencing General Comments: 75% correct with yes/no's with home environment (with family correcting when she was wrong). We spoke about trying to use RUE more with A prn and pt verbally agreed, but the automatically tries to use her LUE when objects are presented (depsite that pt is right handed)              Home Living Family/patient expects to be discharged to:: Private residence Living Arrangements: Spouse/significant other Available Help at Discharge: Family;Available 24 hours/day Type of Home: House       Home Layout: One level     Bathroom Shower/Tub: Tub/shower unit;Curtain   Bathroom Toilet: Handicapped height     Home Equipment: None          Prior Functioning/Environment Level of Independence: Independent        Comments: does not work, drives very little        OT Problem List: Decreased strength;Decreased range of motion;Impaired balance (sitting and/or standing);Decreased safety awareness;Impaired sensation;Impaired UE functional use;Decreased  cognition;Decreased coordination;Obesity;Decreased knowledge of use of DME or AE;Impaired tone      OT Treatment/Interventions: Self-care/ADL training;Balance training;DME and/or AE instruction;Patient/family education;Therapeutic exercise    OT Goals(Current goals can be found in the care plan section) Acute Rehab OT Goals OT Goal Formulation: With patient/family Time For Goal Achievement: 01/14/19 Potential to Achieve Goals: Good  OT Frequency: Min 3X/week              AM-PAC OT "6 Clicks" Daily Activity     Outcome Measure Help from another person eating meals?: A Little Help from another person taking care of personal grooming?: A Lot Help from another person toileting, which includes using toliet, bedpan, or urinal?: A Lot Help from another person bathing (including washing, rinsing, drying)?: A Lot Help from another person to put on and taking off regular upper body clothing?: A Lot Help from another person to put on and taking off regular lower body clothing?: A Lot 6 Click Score: 13   End of Session Equipment Utilized During Treatment: Gait belt Nurse Communication: (RN to change out IV bag, BP good despite pt reports dizziness at times)  Activity Tolerance: Patient tolerated treatment well Patient left: in bed;with call bell/phone within reach;with bed alarm set;with family/visitor present  OT Visit Diagnosis: Unsteadiness on feet (R26.81);Other abnormalities of gait and mobility (R26.89);Muscle weakness (generalized) (M62.81);Other symptoms and signs involving cognitive function;Cognitive communication deficit (R41.841);Dizziness and giddiness (R42) Symptoms and signs involving cognitive functions: Cerebral infarction  Time: 8638-1771 OT Time Calculation (min): 46 min Charges:  OT General Charges $OT Visit: 1 Visit OT Evaluation $OT Eval Moderate Complexity: 1 Mod OT Treatments $Self Care/Home Management : 23-37 mins  [address]en Circle, OTR/L Acute  [employer] Pager [phone] Office [phone]     Almon Register 12/31/2018, 11:14 AM

## 2018-12-31 NOTE — Consult Note (Signed)
[facility] [place]  Patient name: Madison Medina MRN: [id number] DOB: Oct 13, 1961 Sex: female   REQUESTING PROVIDER:    Hospital service   REASON FOR CONSULT:    Symptomatic carotid stenosis  HISTORY OF PRESENT ILLNESS:   Madison Medina is a 58 y.o. female, who presented to [place] and hypertensive with dysarthria and right arm weakness.  She was found to have a left MCA acute infarct and high grade bilateral carotid stenosis on MRI and CTA.  A carotid duplex was concerning for left ICA occlusion.  SHe was given 325 ASA and transferred to [facility].  She did not receive TPA.  Her slurred speech resolved but right sided weakness persisted  The patient is medically managed for hypertension.  SHe has CAD and a history of MI.  SHe is a long term smoker and has COPD.  She is now taking a statin.  PAST MEDICAL HISTORY    Past Medical History:  Diagnosis Date  . Asthma   . Back pain   . COPD (chronic obstructive pulmonary disease) ([place])   . Hyperlipidemia   . Hypertension   . Hypothyroidism   . Stroke [facility])      FAMILY HISTORY   Family History  Problem Relation Age of Onset  . High blood pressure Other   . Diabetes Other   . Cancer Other   . Stroke Other   . Heart disease Other     SOCIAL HISTORY:   Social History   Socioeconomic History  . Marital status: Married    Spouse name: Not on file  . Number of children: Not on file  . Years of education: Not on file  . Highest education level: Not on file  Occupational History  . Not on file  Social Needs  . Financial resource strain: Not on file  . Food insecurity:    Worry: Not on file    Inability: Not on file  . Transportation needs:    Medical: Not on file    Non-medical: Not on file  Tobacco Use  . Smoking status: Current Every Day Smoker    Packs/day: 2.00    Years: 40.00    Pack years: 80.00    Types: Cigarettes  . Smokeless tobacco:  Never Used  Substance and Sexual Activity  . Alcohol use: Not Currently    Frequency: Never  . Drug use: Never  . Sexual activity: Not on file  Lifestyle  . Physical activity:    Days per week: Not on file    Minutes per session: Not on file  . Stress: Not on file  Relationships  . Social connections:    Talks on phone: Not on file    Gets together: Not on file    Attends religious service: Not on file    Active member of club or organization: Not on file    Attends meetings of clubs or organizations: Not on file    Relationship status: Not on file  . Intimate partner violence:    Fear of current or ex partner: Not on file    Emotionally abused: Not on file    Physically abused: Not on file    Forced sexual activity: Not on file  Other Topics Concern  . Not on file  Social History Narrative  . Not on file    ALLERGIES:    Not on File  CURRENT MEDICATIONS:    Current Facility-Administered Medications  Medication Dose  Route Frequency Provider Last Rate Last Dose  . 0.9 %  sodium chloride infusion   Intravenous Continuous Janora Norlander, MD      . 0.9 %  sodium chloride infusion   Intravenous Continuous Kerney Elbe, MD 75 mL/hr at 12/30/18 2058    . acetaminophen (TYLENOL) tablet 650 mg  650 mg Oral Q4H PRN Janora Norlander, MD       Or  . acetaminophen (TYLENOL) solution 650 mg  650 mg Per Tube Q4H PRN Janora Norlander, MD       Or  . acetaminophen (TYLENOL) suppository 650 mg  650 mg Rectal Q4H PRN Janora Norlander, MD      . aspirin EC tablet 325 mg  325 mg Oral Daily Kerney Elbe, MD   325 mg at 12/31/18 0804  . atorvastatin (LIPITOR) tablet 80 mg  80 mg Oral Daily Janora Norlander, MD   80 mg at 12/31/18 0805  . celecoxib (CELEBREX) capsule 200 mg  200 mg Oral BID Janora Norlander, MD   200 mg at 12/31/18 4818  . citalopram (CELEXA) tablet 20 mg  20 mg Oral Daily Janora Norlander, MD   20 mg at 12/31/18 5631  . enoxaparin (LOVENOX) injection 40 mg  40 mg  Subcutaneous Q24H Janora Norlander, MD   40 mg at 12/30/18 1910  . famotidine (PEPCID) tablet 40 mg  40 mg Oral QHS Janora Norlander, MD   40 mg at 12/30/18 2148  . levothyroxine (SYNTHROID, LEVOTHROID) tablet 25 mcg  25 mcg Oral Q0600 Janora Norlander, MD   25 mcg at 12/31/18 4970  . montelukast (SINGULAIR) tablet 10 mg  10 mg Oral Daily Janora Norlander, MD   10 mg at 12/31/18 0805  . oxyCODONE-acetaminophen (PERCOCET) 7.5-325 MG per tablet 1 tablet  1 tablet Oral Q6H PRN Janora Norlander, MD      . pantoprazole (PROTONIX) EC tablet 40 mg  40 mg Oral Daily Janora Norlander, MD   40 mg at 12/31/18 0804  . senna-docusate (Senokot-S) tablet 1 tablet  1 tablet Oral QHS PRN Janora Norlander, MD        REVIEW OF SYSTEMS:   [X]  denotes positive finding, [ ]  denotes negative finding Cardiac  Comments:  Chest pain or chest pressure:    Shortness of breath upon exertion:    Short of breath when lying flat:    Irregular heart rhythm:        Vascular    Pain in calf, thigh, or hip brought on by ambulation:    Pain in feet at night that wakes you up from your sleep:     Blood clot in your veins:    Leg swelling:         Pulmonary    Oxygen at home:    Productive cough:     Wheezing:         Neurologic    Sudden weakness in arms or legs:  x   Sudden numbness in arms or legs:     Sudden onset of difficulty speaking or slurred speech: x   Temporary loss of vision in one eye:     Problems with dizziness:         Gastrointestinal    Blood in stool:      Vomited blood:         Genitourinary    Burning when urinating:     Blood in urine:  Psychiatric    Major depression:         Hematologic    Bleeding problems:    Problems with blood clotting too easily:        Skin    Rashes or ulcers:        Constitutional    Fever or chills:     PHYSICAL EXAM:   Vitals:   12/30/18 2323 12/31/18 0419 12/31/18 0730 12/31/18 1130  BP: 126/78 123/61 138/60 (!) 143/70  Pulse: (!) 59  60 (!) 59 (!) 59  Resp: 20 18 18 18   Temp: 98 F (36.7 C) 98 F (36.7 C) 97.8 F (36.6 C) 97.8 F (36.6 C)  TempSrc: Oral Oral Oral Oral  SpO2: 100% 98% 99% 96%  Weight:    121.7 kg  Height:    5\' 4"  (1.626 m)    GENERAL: The patient is a well-nourished female, in no acute distress. The vital signs are documented above. CARDIAC: There is a regular rate and rhythm.  PULMONARY: Nonlabored respirations ABDOMEN: Soft and non-tender with normal pitched bowel sounds.  MUSCULOSKELETAL: There are no major deformities or cyanosis. NEUROLOGIC: Right arm weakness SKIN: There are no ulcers or rashes noted. PSYCHIATRIC: The patient has a normal affect.  STUDIES:   I have reviewed her imaging studies as follows:  CTA Head and Neck: 1. The Left ICA is patent despite tandem RADIOGRAPHIC STRING SIGN stenoses and near occlusion at its origin. Superimposed moderate Left ICA siphon stenosis due to bulky calcified plaque. No Left MCA branch occlusion identified. 2. Severely age advanced atherosclerosis in the head and neck. Other hemodynamically significant stenoses: - Right ICA origin severe (70%). - dominant Left Vertebral Artery origin (moderate) and V4 segment (moderate to severe). Note that this is the Basilar Artery supply. - Left ACA origin (moderate to severe). - non dominant Right Vertebral Artery origin (severe, near occlusion). 3. Expected CT appearance of multifocal left hemisphere infarcts. No hemorrhage or mass effect.  MRI Brain:  Patchy acute infarcts throughout the left cerebral hemisphere   ASSESSMENT and PLAN   Symptomatic left carotid stenosis: The patient CT scan reveals that her left carotid artery remains patent but highly stenotic.  This is the most likely reason for her stroke.  I have recommended proceeding with left carotid endarterectomy for stroke prevention.  Her family was at bedside today.  I discussed the risks and benefits of surgery including the risk of  stroke, nerve injury, and cardiopulmonary complications.  All of her questions were answered and she wishes to proceed.  I am planning on taking her to the operating room on Wednesday.  I would like for her to have a cardiology evaluation prior to her surgery.   Annamarie Major, MD [facility] [phone] Pager [phone]

## 2018-12-31 NOTE — Progress Notes (Signed)
PROGRESS NOTE  Madison Medina:[id number] DOB: [birth year] DOA: 12/30/2018 PCP: Welford Roche, NP  HPI/Recap of past 24 hours: Madison Medina is a 58 y.o. female with medical history significant for HTN, obesity, HLD, hypothyroidism, COPD and 39 pack-year smoking hx (current smoker) who was transferred to [facility] today from [place] for acute CVA. The morning of Jan 15 she had a breast biopsy at [facility] and around that time (~0800) she had the acute onset of RUE tingling and numbness and shortly thereafter developed RLE weakness. The next morning around 0900 family noticed that pt was mumbling and had garbled speech. Patient herself was unaware she was speaking differently than usual and she was able to understand what people were saying to her. However later on in the day she became confused and was aware of it and felt she needed to go to the ED.  At [facility], she was afebrile, HR 68, RR 18, 97% on RA. She was hypertensive at 189/88. Dysarthria and confusion had resolved but R arm weakness remained. Pt was able to answer "yes" or "no" to questions but otherwise unable to speak. Labs were remarkabel for hyponatremia at 132, slight increase in baseline creatinine at 1.1 (baseline appears to be ~0.7 - 0.8). Labs o/w were unremarkable. Head CT was negative but brain MRI revealed patchy acute infarcts in the L cerebral hemisphere with involvement of the superficial and deep watershed zones, new abnormal appearance of the L ICA. CTA head/neck showed near occlusion of L proximal ICA with severe thread-like stenosis with mixed plaque. R prox ICA had severe 90% stenosis with mixed plaque. tPA was not given due to pt being out of the window. She was given ASA 325 mg and teleneurology consult was obtained. Decision was made to transfer pt to [facility].  12/31/18: Seen and examined with her husband and other family members at her bedside.  She appears to have both expressive and receptive aphasia.  Right upper extremity weakness  persists with 3 out of 5 strength on exam.  PT OT assessed and recommended CIR.  CIR consulted for inpatient rehab admission eval.   Assessment/Plan: Principal Problem:   CVA (cerebral vascular accident) ([place]) Active Problems:   HTN (hypertension)   Anxiety   Hypothyroid   Familial combined hyperlipidemia   Tobacco use   COPD (chronic obstructive pulmonary disease) ([place])   AKI (acute kidney injury) ([place])   Hyponatremia  Acute ischemic left cerebral CVA MRI done at [facility] revealed: Patchy acute infarcts left cerebral hemisphere with involvement of superficial and deep watershed zones. New abnormal appearance of the left ICA. 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 Right proximal ICA severe 90% stenosis with mixed plaque. Anterior left vertebral artery origin moderate 50% stenosis. Patent diminutive right vertebral artery.  Diminutive left ICA downstream to bifurcation, stenosis likely due to chronic slow flow. Patent anterior and posterior intracranial circulation.  No large vessel occlusion, aneurysm or significant stenosis is identified. Neurology following Vascular surgery has been consulted; recommended repeating CTA neck  CTA head and neck with and without contrast pending PT/OT/speech have assessed and recommended CIR CIR has been consulted Continue physical therapy Fall precautions On full dose aspirin and high-dose Lipitor, continue LDL 183 with a goal LDL of less than 70 A1c 5.7 with goal A1c less than 7.0 Continue to monitor vital signs on telemetry  Severe bilateral carotid artery stenosis Management as stated above Vascular surgery has been consulted, Dr  Brabham  Uncontrolled hyperlipidemia LDL 183 with a goal LDL of less than 70 On Lipitor 80 mg daily  Current tobacco user Tobacco cessation counseling at bedside  Morbid obesity BMI 46 Repeat will decide patient Might require short-term  rehab prior to discharge home  Chronic anxiety/depression Continue Celexa  Hypothyroidism Continue levothyroxine  Hypertension Hold off antihypertensive medication for permissive hypertension  GERD Continue omeprazole  DVT prophylaxis: sq lovenox daily Code Status:  Full  Family Communication: several family members in room including husband, children  Disposition Plan: Possibly dc to CIR if insurance approves or qualifies Consults called: neurology     Objective: Vitals:   12/30/18 2323 12/31/18 0419 12/31/18 0730 12/31/18 1130  BP: 126/78 123/61 138/60 (!) 143/70  Pulse: (!) 59 60 (!) 59 (!) 59  Resp: 20 18 18 18   Temp: 98 F (36.7 C) 98 F (36.7 C) 97.8 F (36.6 C) 97.8 F (36.6 C)  TempSrc: Oral Oral Oral Oral  SpO2: 100% 98% 99% 96%  Weight:    121.7 kg  Height:    5\' 4"  (1.626 m)    Intake/Output Summary (Last 24 hours) at 12/31/2018 1603 Last data filed at 12/31/2018 1300 Gross per 24 hour  Intake 800 ml  Output 600 ml  Net 200 ml   Filed Weights   12/31/18 1130  Weight: 121.7 kg    Exam:  . General: 57 y.o. year-old female well developed well nourished in no acute distress.  Alert and aphasic receptive and expressive. . Cardiovascular: Regular rate and rhythm with no rubs or gallops.  No thyromegaly or JVD noted.   Marland Kitchen Respiratory: Clear to auscultation with no wheezes or rales. Good inspiratory effort. . Abdomen: Soft nontender nondistended with normal bowel sounds x4 quadrants. . Musculoskeletal: No lower extremity edema. 2/4 pulses in all 4 extremities. RUE 3/5 strength. . Skin: No ulcerative lesions noted or rashes, . Psychiatry: Mood is appropriate for condition and setting   Data Reviewed: CBC: Recent Labs  Lab 12/30/18 1539 12/31/18 0529  WBC 10.2 10.9*  NEUTROABS 7.5  --   HGB 13.2 13.2  HCT 41.0 39.5  MCV 92.3 93.4  PLT 249 124   Basic Metabolic Panel: Recent Labs  Lab 12/30/18 1539 12/31/18 0529  NA 132* 133*  K 4.3 4.1    CL 98 98  CO2 24 24  GLUCOSE 99 90  BUN 19 21*  CREATININE 1.08* 0.99  CALCIUM 8.7* 8.6*   GFR: Estimated Creatinine Clearance: 79.7 mL/min (by C-G formula based on SCr of 0.99 mg/dL). Liver Function Tests: Recent Labs  Lab 12/30/18 1539  AST 24  ALT 21  ALKPHOS 54  BILITOT 0.6  PROT 7.1  ALBUMIN 3.4*   No results for input(s): LIPASE, AMYLASE in the last 168 hours. No results for input(s): AMMONIA in the last 168 hours. Coagulation Profile: Recent Labs  Lab 12/30/18 1539  INR 1.02   Cardiac Enzymes: No results for input(s): CKTOTAL, CKMB, CKMBINDEX, TROPONINI in the last 168 hours. BNP (last 3 results) No results for input(s): PROBNP in the last 8760 hours. HbA1C: Recent Labs    12/31/18 0529  HGBA1C 5.7*   CBG: Recent Labs  Lab 12/30/18 1155  GLUCAP 107*   Lipid Profile: Recent Labs    12/31/18 0529  CHOL 245*  HDL 39*  LDLCALC 183*  TRIG 117  CHOLHDL 6.3   Thyroid Function Tests: No results for input(s): TSH, T4TOTAL, FREET4, T3FREE, THYROIDAB in the last 72 hours. Anemia Panel: No  results for input(s): VITAMINB12, FOLATE, FERRITIN, TIBC, IRON, RETICCTPCT in the last 72 hours. Urine analysis:    Component Value Date/Time   COLORURINE YELLOW 11/14/2009 [place] 11/14/2009 0955   LABSPEC 1.006 11/14/2009 0955   PHURINE 6.0 11/14/2009 [place] 11/14/2009 0955   HGBUR NEGATIVE 11/14/2009 0955   BILIRUBINUR NEGATIVE 11/14/2009 0955   KETONESUR NEGATIVE 11/14/2009 0955   PROTEINUR NEGATIVE 11/14/2009 0955   UROBILINOGEN 0.2 11/14/2009 0955   NITRITE NEGATIVE 11/14/2009 0955   LEUKOCYTESUR SMALL (A) 11/14/2009 0955   Sepsis Labs: @LABRCNTIP (procalcitonin:4,lacticidven:4)  )No results found for this or any previous visit (from the past 240 hour(s)).    Studies: Ct Angio Head W Or Wo Contrast  Result Date: 12/31/2018 CLINICAL DATA:  58 year old female with scattered acute infarcts in the left hemisphere on  brain MRI 2 days ago and evidence of occlusion or poor flow in the left ICA siphon on that exam. Left ICA occlusion suspected on Doppler ultrasound that day. EXAM: CT ANGIOGRAPHY HEAD AND NECK TECHNIQUE: Multidetector CT imaging of the head and neck was performed using the standard protocol during bolus administration of intravenous contrast. Multiplanar CT image reconstructions and MIPs were obtained to evaluate the vascular anatomy. Carotid stenosis measurements (when applicable) are obtained utilizing NASCET criteria, using the distal internal carotid diameter as the denominator. CONTRAST:  59mL ISOVUE-370 IOPAMIDOL (ISOVUE-370) INJECTION 76% COMPARISON:  Doppler ultrasound 05-Jan-2019. Brain MRI 2019/01/05. head CT 2019-01-05. FINDINGS: CT HEAD Brain: No midline shift, mass effect, or evidence of intracranial mass lesion. No acute intracranial hemorrhage identified. Scattered cortical and white matter hypodensity in the left hemisphere which is more pronounced from the January 05, 2019 CT and corresponds to areas of restricted diffusion by MRI. Stable gray-white matter differentiation elsewhere. Calvarium and skull base: No acute osseous abnormality identified. Paranasal sinuses: Visualized paranasal sinuses and mastoids are stable and well pneumatized. Orbits: Negative orbits and scalp soft tissues. CTA NECK Skeleton: Absent dentition. No acute osseous abnormality identified. Upper chest: Large body habitus. Negative lung apices and visible superior mediastinum. Other neck: Partially retropharyngeal course of the left carotid, normal variant. Otherwise negative; no neck mass or lymphadenopathy. Aortic arch: 4 vessel arch configuration, the left vertebral artery arises directly from the arch. Soft and calcified arch and proximal great vessel atherosclerosis. Right carotid system: No brachiocephalic artery stenosis despite plaque. Soft plaque in the proximal right CCA without stenosis. At the right carotid bifurcation  there is complex soft and calcified plaque resulting in a short segment high-grade stenosis on series 9, image 151 and series 12, image 134. Numerically this is estimated at 70%. Plaque continues to the distal right ICA bulb. The vessel remains patent with no additional stenosis below the skull base. Left carotid system: Soft and calcified plaque at the left CCA origin resulting in less than 50% stenosis with respect to the distal vessel. Widespread low-density plaque in the medial left CCA in the neck. Diffuse vessel irregularity to the bifurcation. At the left ICA origin and bulb there are tandem radiographic string sign stenoses (series 12, images 170 and 172) and near occlusion of the vessel. However, it does remain patent with preserved enhancement to the skull base. There is calcified plaque below the skull base which is not hemodynamically significant. Vertebral arteries: No proximal right subclavian artery origin despite atherosclerosis. Soft and calcified plaque at the left vertebral artery origin results in high-grade stenosis, near occlusion. The right vertebral is non dominant and diminutive but remains patent to  the skull base. The left vertebral arises directly from the arch with soft and calcified plaque at its origin resulting in moderate stenosis. The left vertebral is dominant and has a late entry into the cervical transverse foramen. It is patent to the skull base without additional stenosis. CTA HEAD Posterior circulation: Diminutive right vertebral artery functionally terminates in PICA. The left vertebral supplies the basilar. There is left V4 calcified plaque resulting in moderate to severea stenosis (series 9, images 202 and 205. The basilar is patent but irregular with faint calcified plaque. SCA and left PCA origins are patent and within normal limits. Fetal type right PCA. Left posterior communicating artery diminutive or absent. Left PCA branches are mildly irregular. Right PCA branches  are mildly to moderately irregular. Anterior circulation: The left ICA siphon is patent but with perhaps mildly asymmetric decreased enhancement compared to the right. Left siphon calcified plaque is moderate. And there is moderate stenosis in the cavernous segment. Normal left posterior communicating and ophthalmic artery origins. The supraclinoid left ICA and left ICA terminus are diminutive but patent. Contralateral right ICA siphon is patent with moderate plaque but only mild stenosis. Patent right ICA terminus. Normal MCA origins. Normal right ACA origin. The left A1 appears non dominant but there is moderate to severe stenosis at its origin. Anterior communicating artery is present. Bilateral ACA branches are patent with mild irregularity. The left A2 is dominant. Left MCA M1 segment and bifurcation are patent with mild irregularity. No left MCA branch occlusion is identified. Right MCA M1 segment and bifurcation are patent. No right MCA branch occlusion identified. Venous sinuses: Patent on the delayed images. Anatomic variants: Dominant left vertebral artery which arises directly from the arch and supplies the basilar. Fetal type right PCA origin. Delayed phase: No abnormal enhancement identified. Review of the MIP images confirms the above findings IMPRESSION: 1. The Left ICA is patent despite tandem RADIOGRAPHIC STRING SIGN stenoses and near occlusion at its origin. Superimposed moderate Left ICA siphon stenosis due to bulky calcified plaque. No Left MCA branch occlusion identified. 2. Severely age advanced atherosclerosis in the head and neck. Other hemodynamically significant stenoses: - Right ICA origin severe (70%). - dominant Left Vertebral Artery origin (moderate) and V4 segment (moderate to severe). Note that this is the Basilar Artery supply. - Left ACA origin (moderate to severe). - non dominant Right Vertebral Artery origin (severe, near occlusion). 3. Expected CT appearance of multifocal left  hemisphere infarcts. No hemorrhage or mass effect. Electronically Signed   By: Genevie Ann M.D.   On: 12/31/2018 15:05   Ct Angio Neck W Or Wo Contrast  Result Date: 12/31/2018 CLINICAL DATA:  58 year old female with scattered acute infarcts in the left hemisphere on brain MRI 2 days ago and evidence of occlusion or poor flow in the left ICA siphon on that exam. Left ICA occlusion suspected on Doppler ultrasound that day. EXAM: CT ANGIOGRAPHY HEAD AND NECK TECHNIQUE: Multidetector CT imaging of the head and neck was performed using the standard protocol during bolus administration of intravenous contrast. Multiplanar CT image reconstructions and MIPs were obtained to evaluate the vascular anatomy. Carotid stenosis measurements (when applicable) are obtained utilizing NASCET criteria, using the distal internal carotid diameter as the denominator. CONTRAST:  16mL ISOVUE-370 IOPAMIDOL (ISOVUE-370) INJECTION 76% COMPARISON:  Doppler ultrasound 12/29/2018. Brain MRI 12/29/2018. head CT 12/29/2018. FINDINGS: CT HEAD Brain: No midline shift, mass effect, or evidence of intracranial mass lesion. No acute intracranial hemorrhage identified. Scattered cortical and white matter  hypodensity in the left hemisphere which is more pronounced from the 2019/01/21 CT and corresponds to areas of restricted diffusion by MRI. Stable gray-white matter differentiation elsewhere. Calvarium and skull base: No acute osseous abnormality identified. Paranasal sinuses: Visualized paranasal sinuses and mastoids are stable and well pneumatized. Orbits: Negative orbits and scalp soft tissues. CTA NECK Skeleton: Absent dentition. No acute osseous abnormality identified. Upper chest: Large body habitus. Negative lung apices and visible superior mediastinum. Other neck: Partially retropharyngeal course of the left carotid, normal variant. Otherwise negative; no neck mass or lymphadenopathy. Aortic arch: 4 vessel arch configuration, the left vertebral  artery arises directly from the arch. Soft and calcified arch and proximal great vessel atherosclerosis. Right carotid system: No brachiocephalic artery stenosis despite plaque. Soft plaque in the proximal right CCA without stenosis. At the right carotid bifurcation there is complex soft and calcified plaque resulting in a short segment high-grade stenosis on series 9, image 151 and series 12, image 134. Numerically this is estimated at 70%. Plaque continues to the distal right ICA bulb. The vessel remains patent with no additional stenosis below the skull base. Left carotid system: Soft and calcified plaque at the left CCA origin resulting in less than 50% stenosis with respect to the distal vessel. Widespread low-density plaque in the medial left CCA in the neck. Diffuse vessel irregularity to the bifurcation. At the left ICA origin and bulb there are tandem radiographic string sign stenoses (series 12, images 170 and 172) and near occlusion of the vessel. However, it does remain patent with preserved enhancement to the skull base. There is calcified plaque below the skull base which is not hemodynamically significant. Vertebral arteries: No proximal right subclavian artery origin despite atherosclerosis. Soft and calcified plaque at the left vertebral artery origin results in high-grade stenosis, near occlusion. The right vertebral is non dominant and diminutive but remains patent to the skull base. The left vertebral arises directly from the arch with soft and calcified plaque at its origin resulting in moderate stenosis. The left vertebral is dominant and has a late entry into the cervical transverse foramen. It is patent to the skull base without additional stenosis. CTA HEAD Posterior circulation: Diminutive right vertebral artery functionally terminates in PICA. The left vertebral supplies the basilar. There is left V4 calcified plaque resulting in moderate to severea stenosis (series 9, images 202 and 205.  The basilar is patent but irregular with faint calcified plaque. SCA and left PCA origins are patent and within normal limits. Fetal type right PCA. Left posterior communicating artery diminutive or absent. Left PCA branches are mildly irregular. Right PCA branches are mildly to moderately irregular. Anterior circulation: The left ICA siphon is patent but with perhaps mildly asymmetric decreased enhancement compared to the right. Left siphon calcified plaque is moderate. And there is moderate stenosis in the cavernous segment. Normal left posterior communicating and ophthalmic artery origins. The supraclinoid left ICA and left ICA terminus are diminutive but patent. Contralateral right ICA siphon is patent with moderate plaque but only mild stenosis. Patent right ICA terminus. Normal MCA origins. Normal right ACA origin. The left A1 appears non dominant but there is moderate to severe stenosis at its origin. Anterior communicating artery is present. Bilateral ACA branches are patent with mild irregularity. The left A2 is dominant. Left MCA M1 segment and bifurcation are patent with mild irregularity. No left MCA branch occlusion is identified. Right MCA M1 segment and bifurcation are patent. No right MCA branch occlusion identified. Venous sinuses:  Patent on the delayed images. Anatomic variants: Dominant left vertebral artery which arises directly from the arch and supplies the basilar. Fetal type right PCA origin. Delayed phase: No abnormal enhancement identified. Review of the MIP images confirms the above findings IMPRESSION: 1. The Left ICA is patent despite tandem RADIOGRAPHIC STRING SIGN stenoses and near occlusion at its origin. Superimposed moderate Left ICA siphon stenosis due to bulky calcified plaque. No Left MCA branch occlusion identified. 2. Severely age advanced atherosclerosis in the head and neck. Other hemodynamically significant stenoses: - Right ICA origin severe (70%). - dominant Left Vertebral  Artery origin (moderate) and V4 segment (moderate to severe). Note that this is the Basilar Artery supply. - Left ACA origin (moderate to severe). - non dominant Right Vertebral Artery origin (severe, near occlusion). 3. Expected CT appearance of multifocal left hemisphere infarcts. No hemorrhage or mass effect. Electronically Signed   By: Genevie Ann M.D.   On: 12/31/2018 15:05    Scheduled Meds: . aspirin EC  325 mg Oral Daily  . atorvastatin  80 mg Oral Daily  . celecoxib  200 mg Oral BID  . citalopram  20 mg Oral Daily  . enoxaparin (LOVENOX) injection  40 mg Subcutaneous Q24H  . famotidine  40 mg Oral QHS  . levothyroxine  25 mcg Oral Q0600  . montelukast  10 mg Oral Daily  . pantoprazole  40 mg Oral Daily    Continuous Infusions: . sodium chloride    . sodium chloride 75 mL/hr at 12/30/18 2058     LOS: 1 day     Kayleen Memos, MD [facility] Pager [phone]  If 7PM-7AM, please contact night-coverage [email or web address] Password [facility] 12/31/2018, 4:03 PM

## 2018-12-31 NOTE — Progress Notes (Addendum)
STROKE TEAM PROGRESS NOTE   HISTORY OF PRESENT ILLNESS (per record) Madison Medina is an 58 y.o. female  With PMH HTN, MI, HLD, COPD, asthma, hyperthyroidism (had radiation now hypo) presented to [facility] ED for right arm numbness and weakness. Patient transferred to [facility] for stroke on MRI.  Per family, the patient had a breast biopsy Wednesday and has not been acting right since then. The patient presented to [facility] ED for right arm numbness and weakness that started 12/28/2018 about 7:30 or 8 am. Around 9:30 am on 12/29/2018 she developed slurred speech which resolved, but numbness and weakness persisted. Denies CP, SOB, vision changes. Endorses smoking.  Patient is on no anticoagulation and family states she was not taking ASA prior to hospitalization.   Hospital course (from OSH chart):  CTH: no hemorrhage; mild chronic cerebral white matter changes Chest x-ray: unremarkeable MRI: Patchy acute infarcts left cerebral hemisphere with involvement of superficial and deep watershed zones. New abnormal appearance of the left ICA. 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 Right proximal ICA severe 90% stenosis with mixed plaque. Anterior left vertebral artery origin moderate 50% stenosis.  Patent diminutive right vertebral artery.  Diminutive left ICA downstream to bifurcation, stenosis likely due to chronic slow flow. Patent anterior and posterior intracranial circulation.  No large vessel occlusion, aneurysm or significant stenosis is identified.   Date last known well: 12/28/2018 Time last known well:0800 tPA Given: No: outside of window  Modified Rankin: Rankin Score=0 NIHSS:5  SUBJECTIVE (INTERVAL HISTORY) Her family is at the bedside.  Her husband reports that she does not take her medication compliantly and she has an 80-pack-year history of smoking.  Patient was here for small vessel ischemic stroke several months ago and  there was concern for cerebritis.  MRI of the brain showed left embolic and watershed infarcts.  Imaging was completed at [facility] and to be seen in PACS.  CTA of the head and neck showed multiple areas of atherosclerosis most significant in the left carotid artery bulb and proximal ICA with threadlike appearance.  The right carotid artery bulb and proximal ICA 90%.  Vascular has been consulted for intervention.    OBJECTIVE Vitals:   12/30/18 2248 12/30/18 2323 12/31/18 0419 12/31/18 0730  BP: 136/70 126/78 123/61 138/60  Pulse: 68 (!) 59 60 (!) 59  Resp:  20 18 18   Temp: (!) 97.5 F (36.4 C) 98 F (36.7 C) 98 F (36.7 C) 97.8 F (36.6 C)  TempSrc: Axillary Oral Oral Oral  SpO2: 93% 100% 98% 99%    CBC:  Recent Labs  Lab 12/30/18 1539 12/31/18 0529  WBC 10.2 10.9*  NEUTROABS 7.5  --   HGB 13.2 13.2  HCT 41.0 39.5  MCV 92.3 93.4  PLT 249 932    Basic Metabolic Panel:  Recent Labs  Lab 12/30/18 1539 12/31/18 0529  NA 132* 133*  K 4.3 4.1  CL 98 98  CO2 24 24  GLUCOSE 99 90  BUN 19 21*  CREATININE 1.08* 0.99  CALCIUM 8.7* 8.6*    Lipid Panel:     Component Value Date/Time   CHOL 245 (H) 12/31/2018 0529   TRIG 117 12/31/2018 0529   HDL 39 (L) 12/31/2018 0529   CHOLHDL 6.3 12/31/2018 0529   VLDL 23 12/31/2018 0529   LDLCALC 183 (H) 12/31/2018 0529   HgbA1c:  Lab Results  Component Value Date   HGBA1C 5.7 (H) 12/31/2018   Urine  Drug Screen: No results found for: LABOPIA, COCAINSCRNUR, LABBENZ, AMPHETMU, THCU, LABBARB  Alcohol Level No results found for: Delta  Transthoracic Echocardiogram  00/00/00 Completed at [facility] course (from OSH chart):  CTH: no hemorrhage; mild chronic cerebral white matter changes Chest x-ray: unremarkeable MRI: Patchy acute infarcts left cerebral hemisphere with involvement of superficial and deep watershed zones. New abnormal appearance of the left ICA. CTA head and neck: Left proximal ICA  near-occlusion with severe thread-like stenosis with mixed plaque. Right proximal ICA severe 90% stenosis with mixed plaque. Anterior left vertebral artery origin moderate 50% stenosis.  Patent diminutive right vertebral artery.  Diminutive left ICA downstream to bifurcation, stenosis likely due to chronic slow flow. Patent anterior and posterior intracranial circulation.  No large vessel occlusion, aneurysm or significant stenosis is identified.   PHYSICAL EXAM Blood pressure 138/60, pulse (!) 59, temperature 97.8 F (36.6 C), temperature source Oral, resp. rate 18, SpO2 99 %.   Exam: NAD Speech: Patient can nod yes and no, for example when I asked her if Trump was president she nodded yes, but when I asked her who the president was she said I do not know.  She only says I do not know, no and yes.  But nods yes and no to questions, does appear to have more expressive aphasia than receptive aphasia.  Can follow some simple commands. Cognition:    The patient is oriented to person, place, and time;     recent and remote memory intact;     language fluent;    Cranial Nerves:    The pupils are equal, round, and reactive to light.Trigeminal sensation is intact and the muscles of mastication are normal.  Blinks to threat bilaterally.  Extraocular muscles intact.  The face is symmetric. The palate elevates in the midline. Hearing to voice intact. Voice is normal. Shoulder shrug is normal. The tongue has normal motion without fasciculations.   Coordination:  No dysmetria  Motor Observation:    No asymmetry, no atrophy, and no involuntary movements noted. Tone:    Normal muscle tone.     Strength: Patient appears to have right-sided weakness very difficult exam due to aphasia.  She appears intact in the left.      Sensation: Withdraws to pain in all extremities  Reflexes:  are symmetrical     HOME MEDICATIONS:  Medications Prior to Admission  Medication Sig Dispense Refill  .  levothyroxine (SYNTHROID, LEVOTHROID) 200 MCG tablet Take 200 mcg by mouth daily before breakfast. (in addition to the 2mcg tablet)    . amLODipine (NORVASC) 5 MG tablet Take 5 mg by mouth daily.     Marland Kitchen atorvastatin (LIPITOR) 80 MG tablet Take 80 mg by mouth daily at 6 PM.     . celecoxib (CELEBREX) 200 MG capsule Take 200 mg by mouth 2 (two) times daily.     . citalopram (CELEXA) 20 MG tablet Take 20 mg by mouth daily.    . famotidine (PEPCID) 40 MG tablet Take 40 mg by mouth at bedtime.    . hydrochlorothiazide (HYDRODIURIL) 25 MG tablet Take 25 mg by mouth daily.     Marland Kitchen levothyroxine (SYNTHROID, LEVOTHROID) 25 MCG tablet Take 25 mcg by mouth daily before breakfast. (in addition to the 285mcg tablet)    . lisinopril (PRINIVIL,ZESTRIL) 20 MG tablet Take 20 mg by mouth 2 (two) times daily.     . montelukast (SINGULAIR) 10 MG tablet Take 10 mg by mouth at  bedtime.     Marland Kitchen omeprazole (PRILOSEC) 40 MG capsule Take 40 mg by mouth daily.    Marland Kitchen oxyCODONE-acetaminophen (PERCOCET) 7.5-325 MG tablet Take 1 tablet by mouth 3 (three) times daily.         HOSPITAL MEDICATIONS:  .  stroke: mapping our early stages of recovery book   Does not apply Once  . aspirin EC  325 mg Oral Daily  . atorvastatin  80 mg Oral Daily  . celecoxib  200 mg Oral BID  . citalopram  20 mg Oral Daily  . enoxaparin (LOVENOX) injection  40 mg Subcutaneous Q24H  . famotidine  40 mg Oral QHS  . levothyroxine  25 mcg Oral Q0600  . montelukast  10 mg Oral Daily  . pantoprazole  40 mg Oral Daily    ALLERGIES Not on File  PAST MEDICAL HISTORY Past Medical History:  Diagnosis Date  . Asthma   . Back pain   . COPD (chronic obstructive pulmonary disease) ([place])   . Hyperlipidemia   . Hypertension   . Hypothyroidism   . Stroke [facility])     SURGICAL HISTORY Past Surgical History:  Procedure Laterality Date  . BREAST BIOPSY  12/2018   at breast center in [place]  . CARPAL TUNNEL RELEASE    . CYST EXCISION       FAMILY HISTORY Family History  Problem Relation Age of Onset  . High blood pressure Other   . Diabetes Other   . Cancer Other   . Stroke Other   . Heart disease Other     SOCIAL HISTORY  reports that she has been smoking cigarettes. She has a 80.00 pack-year smoking history. She has never used smokeless tobacco. She reports previous alcohol use. She reports that she does not use drugs.  ASSESSMENT/PLAN Ms. JULIZA MACHNIK is a 58 y.o. female with history of medication noncompliance, hypertension, MI, hyperlipidemia, COPD, asthma, thyroid disease, current smoker 80 pack year history.Presented with right arm weakness and numbness. She was not taking her medications at home and not taking ASA.  She did not receive IV t-PA due to out of the window.  Stroke: Embolic and watershed strokes left hemisphere due to severe carotid artery stenosis at the carotid bulb and proximal ICA.  Resultant: Aphasia and right hemiparesis.  CT head: No hemorrhage, mild chronic cerebral white matter changes.  MRI head -patchy acute infarcts left cerebral hemisphere with involvement of the deep watershed zones.  CTA H&N -left proximal ICA near occlusion with severe threadlike stenosis and mixed plaque.  Right proximal ICA severe 90% stenosis with mixed plaque.  Other areas of stenoses (see full report above)  2D Echo - pending  LDL - 183  HgbA1c - 5.7  UDS - not ordered  No prior to admission, now on ASA 325  Patient and her family counseled to help patient be compliant with her antithrombotic medications  Ongoing aggressive stroke risk factor management  Therapy recommendations:  pending  Disposition:  Pending  Hypertension  Stable .  Permissive hypertension (OK if < 220/120) but gradually normalize in 5-7 days .  Long-term BP goal normotensive  Hyperlipidemia  Lipid lowering medication PTA:  Start Lipitor 80mg   LDL 183, goal < 70  Current lipid lowering medication:on  Lipitor  Continue statin at discharge  Diabetes  HgbA1c 5.7, goal < 7.0  Controlled  Other Stroke Risk Factors  Advanced age  Cigarette smoker - advised family and patiet that patient needs to stop smoking  ETOH use, advised patient and family to help patient drink no more than 1 drink(s) a day  Obesity,  recommend weight loss, diet and exercise as appropriate   Medication non-compliance  Coronary artery disease  FHx stroke, diabetes, HTN and heart disease  Other Active Problems  Hypothyroidism  COPD  Recently diagnosed breast cancer  Plan  - Needs vascular surgery evaluation for bilateral severe Carotid atherosclerosis.   Stroke team will sign off but please re-consult if needed Hospital day # 1  Personally  participated in, made any corrections needed, and agree with history, physical, neuro exam,assessment and plan as stated above.     Sarina Ill, MD [facility]    Personally examined patient and images, and have participated in and made any corrections needed to history, physical, neuro exam,assessment and plan as stated above.  I have personally obtained the history, evaluated lab date, reviewed imaging studies and agree with radiology interpretations.    Sarina Ill, MD Stroke Neurology   To contact Stroke Continuity provider, please refer to [email or web address] After hours, contact General Neurology

## 2018-12-31 NOTE — Evaluation (Signed)
Speech Language Pathology Evaluation Patient Details Name: Madison Medina MRN: [id number] DOB: 04-Jun-1961 Today's Date: 12/31/2018 Time: 7846-9629 SLP Time Calculation (min) (ACUTE ONLY): 27 min  Problem List:  Patient Active Problem List   Diagnosis Date Noted  . CVA (cerebral vascular accident) ([place]) 12/30/2018  . HTN (hypertension) 12/30/2018  . COPD (chronic obstructive pulmonary disease) ([place]) 12/30/2018  . AKI (acute kidney injury) ([place]) 12/30/2018  . Hyponatremia 12/30/2018  . Anxiety 10/12/2016  . 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 Tobacco use 07/09/2016  . Hypothyroid 02/03/2016   Past Medical History:  Past Medical History:  Diagnosis Date  . Asthma   . Back pain   . COPD (chronic obstructive pulmonary disease) ([place])   . Hyperlipidemia   . Hypertension   . Hypothyroidism   . Stroke [facility])    Past Surgical History:  Past Surgical History:  Procedure Laterality Date  . BREAST BIOPSY  12/2018   at breast center in [place]  . CARPAL TUNNEL RELEASE    . CYST EXCISION     HPI:  58 y.o. female with medical history significant for HTN, obesity, HLD, hypothyroidism, COPD and 80 pack-year smoking hx (current smoker) who was transferred to [facility] from [place] for acute CVA.  Pt passed [employer] screen; family reports difficulty with communication; CT head pending.  Assessment / Plan / Recommendation Clinical Impression   Pt presents with expressive/receptive aphasia characterized by perseveration/decreased awareness of errors throughout conversation with frequent "yea" or "I don't know" responses to questions; Y/N questions 80% accurate with basic environmental questions; 2-step directives inconsistent (25% accurate); pt able to repeat single words with 75% accuracy, but phrase completion only yielded 25% accuracy with max multi-modal cues provided; pt counted along with SLP and stated days of week with max verbal cueing provided; pt able to state  "he's my husband" when questioned, but automatic responses only noted without min-max cues provided during word production during SLE; pt named 1/3 animals with partial administration of MOCA (Montreal cognitive assessment) and 10% accuracy gained with convergent/divergent tasks.  Pt with decreased awareness of speech errors and functional deficits (Right arm hemiparesis); cognition impaired, but difficult to fully assess d/t extent of expressive/receptive aphasia.  Recommend CIR for improving speech/language/cognitive skills; ST will f/u for these deficits during acute stay; thank you for this consult.    SLP Assessment  SLP Recommendation/Assessment: Patient needs continued Speech Language Pathology Services SLP Visit Diagnosis: Aphasia (R47.01);Cognitive communication deficit (R41.841)    Follow Up Recommendations  Inpatient Rehab    Frequency and Duration min 2x/week  1 week      SLP Evaluation Cognition  Overall Cognitive Status: Impaired/Different from baseline Arousal/Alertness: Awake/alert Orientation Level: Oriented to person;Disoriented to place;Disoriented to time;Disoriented to situation(difficult to fully assess d/t expressive aphasia) Awareness: Impaired Awareness Impairment: Emergent impairment Behaviors: Perseveration Safety/Judgment: Impaired(d/t neglect)       Comprehension  Auditory Comprehension Overall Auditory Comprehension: Impaired Yes/No Questions: Impaired Basic Immediate Environment Questions: 50-74% accurate Commands: Impaired Two Step Basic Commands: 25-49% accurate Conversation: Simple Interfering Components: Motor planning;Attention EffectiveTechniques: Repetition;Visual/Gestural cues;Pausing Visual Recognition/Discrimination Discrimination: Exceptions to [facility] Reading Comprehension Reading Status: [facility] for simple familiar words )    Expression Expression Primary Mode of Expression: Verbal Verbal Expression Overall Verbal Expression:  Impaired Initiation: Impaired Automatic Speech: Counting;Day of week Level of Generative/Spontaneous Verbalization: Phrase Repetition: Impaired Level of Impairment: Phrase level Naming: Impairment Responsive: 26-50% accurate Confrontation: Impaired Convergent: 0-24% accurate Divergent: 0-24% accurate Verbal Errors: Perseveration;Not aware of errors  Pragmatics: Unable to assess Effective Techniques: Written cues;Articulatory cues;Melodic intonation Non-Verbal Means of Communication: Gestures Written Expression Dominant Hand: Right Written Expression: Unable to assess (comment)(dominant hand affected)   Oral / Motor  Oral Motor/Sensory Function Overall Oral Motor/Sensory Function: Mild impairment Facial ROM: Reduced right Facial Symmetry: Abnormal symmetry right Facial Sensation: Reduced right Lingual Symmetry: Within Functional Limits Lingual Strength: Within Functional Limits Lingual Sensation: Within Functional Limits Motor Speech Overall Motor Speech: Other (comment)(DTA d/t expressive aphasia ) Respiration: Within functional limits Phonation: Normal Resonance: Within functional limits Articulation: Within functional limitis Intelligibility: Intelligible Motor Planning: Not tested Motor Speech Errors: Not applicable                       Elvina Sidle, M.S., CCC-SLP 12/31/2018, 1:14 PM

## 2019-01-01 DIAGNOSIS — I63232 Cerebral infarction due to unspecified occlusion or stenosis of left carotid arteries: Secondary | ICD-10-CM

## 2019-01-01 DIAGNOSIS — I639 Cerebral infarction, unspecified: Secondary | ICD-10-CM

## 2019-01-01 DIAGNOSIS — Z01818 Encounter for other preprocedural examination: Secondary | ICD-10-CM

## 2019-01-01 DIAGNOSIS — I6523 Occlusion and stenosis of bilateral carotid arteries: Secondary | ICD-10-CM

## 2019-01-01 LAB — BASIC METABOLIC PANEL
Anion gap: 11 (ref 5–15)
BUN: 14 mg/dL (ref 6–20)
CO2: 22 mmol/L (ref 22–32)
Calcium: 7.9 mg/dL — ABNORMAL LOW (ref 8.9–10.3)
Chloride: 101 mmol/L (ref 98–111)
Creatinine, Ser: 0.78 mg/dL (ref 0.44–1.00)
GFR calc Af Amer: >60 mL/min (ref >60)
GFR calc non Af Amer: >60 mL/min (ref >60)
GLUCOSE: 91 mg/dL (ref 70–99)
Potassium: 3.8 mmol/L (ref 3.5–5.1)
Sodium: 134 mmol/L — ABNORMAL LOW (ref 135–145)

## 2019-01-01 LAB — CBC
HCT: 37.2 % (ref 36.0–46.0)
Hemoglobin: 12 g/dL (ref 12.0–15.0)
MCH: 29.9 pg (ref 26.0–34.0)
MCHC: 32.3 g/dL (ref 30.0–36.0)
MCV: 92.8 fL (ref 80.0–100.0)
NRBC: 0 % (ref 0.0–0.2)
Platelets: 234 10*3/uL (ref 150–400)
RBC: 4.01 MIL/uL (ref 3.87–5.11)
RDW: 14.4 % (ref 11.5–15.5)
WBC: 8.8 10*3/uL (ref 4.0–10.5)

## 2019-01-01 MED ORDER — METOPROLOL TARTRATE 25 MG PO TABS
25.0000 mg | ORAL_TABLET | Freq: Two times a day (BID) | ORAL | Status: DC
  Administered 2019-01-01 – 2019-01-03 (×3): 25 mg via ORAL
  Filled 2019-01-01 (×3): qty 1

## 2019-01-01 NOTE — Consult Note (Signed)
Physical Medicine and Rehabilitation Consult Reason for Consult:right sided weakness and language deficits Referring Physician: [place] Crane   HPI: Madison Medina is a 58 y.o. female who on December 28, 2018 after a breast biopsy developed right-sided weakness and numbness as well as difficulties with word finding which developed on January 16..  Patient presented to [facility] on December 29, 2018.  Head CT was negative but MRI revealed patchy acute infarcts in the left cerebral hemisphere along watershed zones.  CTA of the head and neck showed near occlusion of the left proximal ICA with severe threadlike stenosis and mixed plaque.  TPA was not given as patient was outside of the therapeutic window.  Patient was ultimately transferred to [facility] for further management.  Patient continues to struggle with right-sided weakness as well as cognitive and language deficits.  Physical medicine rehab was asked to assess the patient for ongoing rehab needs.   Review of Systems  Constitutional: Negative for fever.  HENT: Negative for hearing loss.   Eyes: Negative for double vision.  Cardiovascular: Negative for chest pain.  Gastrointestinal: Negative for nausea.  Genitourinary: Negative for dysuria.  Musculoskeletal: Negative for myalgias.  Skin: Negative for rash.  Neurological: Positive for speech change and focal weakness. Negative for headaches.  Psychiatric/Behavioral: Negative for depression.   Past Medical History:  Diagnosis Date  . Asthma   . Back pain   . COPD (chronic obstructive pulmonary disease) ([place])   . Hyperlipidemia   . Hypertension   . Hypothyroidism   . Stroke [facility])    Past Surgical History:  Procedure Laterality Date  . BREAST BIOPSY  12/2018   at breast center in [place]  . CARPAL TUNNEL RELEASE    . CYST EXCISION     Family History  Problem Relation Age of Onset  . High blood pressure Other   . Diabetes Other   . Cancer Other   .  Stroke Other   . Heart disease Other    Social History:  reports that she has been smoking cigarettes. She has a 80.00 pack-year smoking history. She has never used smokeless tobacco. She reports previous alcohol use. She reports that she does not use drugs. Allergies: Not on File Medications Prior to Admission  Medication Sig Dispense Refill  . acetaminophen (TYLENOL) 325 MG tablet Take 650 mg by mouth every 6 (six) hours as needed for mild pain, fever or headache.    Marland Kitchen amLODipine (NORVASC) 5 MG tablet Take 5 mg by mouth daily.     Marland Kitchen atorvastatin (LIPITOR) 80 MG tablet Take 80 mg by mouth daily at 6 PM.     . celecoxib (CELEBREX) 200 MG capsule Take 200 mg by mouth 2 (two) times daily.     . citalopram (CELEXA) 20 MG tablet Take 20 mg by mouth daily.    . famotidine (PEPCID) 40 MG tablet Take 40 mg by mouth at bedtime.    . hydrochlorothiazide (HYDRODIURIL) 25 MG tablet Take 25 mg by mouth daily.     Marland Kitchen levothyroxine (SYNTHROID, LEVOTHROID) 200 MCG tablet Take 200 mcg by mouth daily before breakfast. (in addition to the 75mcg tablet)    . levothyroxine (SYNTHROID, LEVOTHROID) 25 MCG tablet Take 25 mcg by mouth daily before breakfast. (in addition to the 21mcg tablet)    . lisinopril (PRINIVIL,ZESTRIL) 20 MG tablet Take 20 mg by mouth 2 (two) times daily.     . montelukast (SINGULAIR) 10 MG tablet Take 10  mg by mouth at bedtime.     Marland Kitchen omeprazole (PRILOSEC) 40 MG capsule Take 40 mg by mouth daily.    Marland Kitchen oxyCODONE-acetaminophen (PERCOCET) 7.5-325 MG tablet Take 1 tablet by mouth 3 (three) times daily.       Home: Home Living Family/patient expects to be discharged to:: Private residence Living Arrangements: Spouse/significant other Available Help at Discharge: Family, Available 24 hours/day Type of Home: House Home Access: Level entry [place]: One level Bathroom Shower/Tub: [place]rop[place]: Handicapped height [place]: None  Lives With: Spouse  Functional  History: Prior Function Level of Independence: Independent Comments: does not work, drives very little Functional Status:  Mobility: Bed Mobility Overal bed mobility: Needs Assistance Bed Mobility: Supine to Sit, Sit to Supine Supine to sit: Supervision, HOB elevated Sit to supine: Supervision General bed mobility comments: supervision for safety Transfers Overall transfer level: Needs assistance Equipment used: None Transfers: Sit to/from Stand Sit to Stand: Min guard General transfer comment: min guard for safety Ambulation/Gait Ambulation/Gait assistance: Min guard Gait Distance (Feet): 75 Feet Assistive device: None Gait Pattern/deviations: Step-through pattern, Decreased step length - right, Decreased step length - left, Decreased stride length, Drifts right/left General Gait Details: pt with mild instability but no overt LOB or need for physical assistance; pt bumping into objects occasionally on L side Gait velocity: decreased    ADL: ADL Overall ADL's : Needs assistance/impaired Eating/Feeding: Moderate assistance, Sitting Eating/Feeding Details (indicate cue type and reason): with use of dominant RUE Grooming: Maximal assistance, Sitting Grooming Details (indicate cue type and reason): with use of dominant RUE Upper Body Bathing: Moderate assistance, Sitting Lower Body Bathing: Moderate assistance Lower Body Bathing Details (indicate cue type and reason): min  A sit<>stand Upper Body Dressing : Maximal assistance, Sitting Toilet Transfer: Maximal assistance Toilet Transfer Details (indicate cue type and reason): min A sit<>stand Toileting- Clothing Manipulation and Hygiene: Moderate assistance Toileting - Clothing Manipulation Details (indicate cue type and reason): min A sit<>stand  Cognition: Cognition Overall Cognitive Status: Impaired/Different from baseline Arousal/Alertness: Awake/alert Orientation Level: Oriented to person, Oriented to place, Disoriented  to time, Disoriented to situation(aphasic) Awareness: Impaired Awareness Impairment: Emergent impairment Behaviors: Perseveration Safety/Judgment: Impaired(d/t neglect) Cognition Arousal/Alertness: Awake/alert Behavior During Therapy: WFL for tasks assessed/performed Overall Cognitive Status: Impaired/Different from baseline Area of Impairment: Memory, Following commands, Safety/judgement, Problem solving Memory: Decreased short-term memory, Decreased recall of precautions Following Commands: Follows one step commands consistently Safety/Judgement: Decreased awareness of safety, Decreased awareness of deficits Problem Solving: Difficulty sequencing, Requires verbal cues General Comments: 75% correct with yes/no's with home environment (with family correcting when she was wrong). We spoke about trying to use RUE more with A prn and pt verbally agreed, but the automatically tries to use her LUE when objects are presented (depsite that pt is right handed)  Blood pressure (!) 160/76, pulse 61, temperature 97.7 F (36.5 C), temperature source Oral, resp. rate 20, height 5\' 4"  (1.626 m), weight 121.7 kg, SpO2 97 %. Physical Exam  Constitutional: No distress.  obese  HENT:  Head: Normocephalic.  Mouth/Throat: Oropharynx is clear and moist.  Eyes: Pupils are equal, round, and reactive to light.  Neck: Normal range of motion.  Cardiovascular: Normal rate.  Respiratory: Effort normal.  GI: Soft.  Musculoskeletal:        General: No edema.  Neurological:  Pt oriented to place, why with cues. Expressive language deficits. Receptive language fairly intact. Mild right central 7. RUE 2/5 prox to distal. RLE 4+/5. No focal  sensory findings. DTRS 2+ right, toes up.   Skin: Skin is warm.  Psychiatric: She has a normal mood and affect. Her behavior is normal.    Results for orders placed or performed during the hospital encounter of 12/30/18 (from the past 24 hour(s))  CBC     Status: None    Collection Time: 01/01/19  7:08 AM  Result Value Ref Range   WBC 8.8 4.0 - 10.5 K/uL   RBC 4.01 3.87 - 5.11 MIL/uL   Hemoglobin 12.0 12.0 - 15.0 g/dL   HCT 37.2 36.0 - 46.0 %   MCV 92.8 80.0 - 100.0 fL   MCH 29.9 26.0 - 34.0 pg   MCHC 32.3 30.0 - 36.0 g/dL   RDW 14.4 11.5 - 15.5 %   Platelets 234 150 - 400 K/uL   nRBC 0.0 0.0 - 0.2 %  Basic metabolic panel     Status: Abnormal   Collection Time: 01/01/19  7:08 AM  Result Value Ref Range   Sodium 134 (L) 135 - 145 mmol/L   Potassium 3.8 3.5 - 5.1 mmol/L   Chloride 101 98 - 111 mmol/L   CO2 22 22 - 32 mmol/L   Glucose, Bld 91 70 - 99 mg/dL   BUN 14 6 - 20 mg/dL   Creatinine, Ser 0.78 0.44 - 1.00 mg/dL   Calcium 7.9 (L) 8.9 - 10.3 mg/dL   GFR calc non Af Amer >60 >60 mL/min   GFR calc Af Amer >60 >60 mL/min   Anion gap 11 5 - 15   Ct Angio Head W Or Wo Contrast  Result Date: 12/31/2018 CLINICAL DATA:  58 year old female with scattered acute infarcts in the left hemisphere on brain MRI 2 days ago and evidence of occlusion or poor flow in the left ICA siphon on that exam. Left ICA occlusion suspected on Doppler ultrasound that day. EXAM: CT ANGIOGRAPHY HEAD AND NECK TECHNIQUE: Multidetector CT imaging of the head and neck was performed using the standard protocol during bolus administration of intravenous contrast. Multiplanar CT image reconstructions and MIPs were obtained to evaluate the vascular anatomy. Carotid stenosis measurements (when applicable) are obtained utilizing NASCET criteria, using the distal internal carotid diameter as the denominator. CONTRAST:  27mL ISOVUE-370 IOPAMIDOL (ISOVUE-370) INJECTION 76% COMPARISON:  Doppler ultrasound January 01, 2019. Brain MRI 01-Jan-2019. head CT 01-01-2019. FINDINGS: CT HEAD Brain: No midline shift, mass effect, or evidence of intracranial mass lesion. No acute intracranial hemorrhage identified. Scattered cortical and white matter hypodensity in the left hemisphere which is more pronounced from  the 01-01-2019 CT and corresponds to areas of restricted diffusion by MRI. Stable gray-white matter differentiation elsewhere. Calvarium and skull base: No acute osseous abnormality identified. Paranasal sinuses: Visualized paranasal sinuses and mastoids are stable and well pneumatized. Orbits: Negative orbits and scalp soft tissues. CTA NECK Skeleton: Absent dentition. No acute osseous abnormality identified. Upper chest: Large body habitus. Negative lung apices and visible superior mediastinum. Other neck: Partially retropharyngeal course of the left carotid, normal variant. Otherwise negative; no neck mass or lymphadenopathy. Aortic arch: 4 vessel arch configuration, the left vertebral artery arises directly from the arch. Soft and calcified arch and proximal great vessel atherosclerosis. Right carotid system: No brachiocephalic artery stenosis despite plaque. Soft plaque in the proximal right CCA without stenosis. At the right carotid bifurcation there is complex soft and calcified plaque resulting in a short segment high-grade stenosis on series 9, image 151 and series 12, image 134. Numerically this is estimated at 70%. Plaque continues  to the distal right ICA bulb. The vessel remains patent with no additional stenosis below the skull base. Left carotid system: Soft and calcified plaque at the left CCA origin resulting in less than 50% stenosis with respect to the distal vessel. Widespread low-density plaque in the medial left CCA in the neck. Diffuse vessel irregularity to the bifurcation. At the left ICA origin and bulb there are tandem radiographic string sign stenoses (series 12, images 170 and 172) and near occlusion of the vessel. However, it does remain patent with preserved enhancement to the skull base. There is calcified plaque below the skull base which is not hemodynamically significant. Vertebral arteries: No proximal right subclavian artery origin despite atherosclerosis. Soft and calcified  plaque at the left vertebral artery origin results in high-grade stenosis, near occlusion. The right vertebral is non dominant and diminutive but remains patent to the skull base. The left vertebral arises directly from the arch with soft and calcified plaque at its origin resulting in moderate stenosis. The left vertebral is dominant and has a late entry into the cervical transverse foramen. It is patent to the skull base without additional stenosis. CTA HEAD Posterior circulation: Diminutive right vertebral artery functionally terminates in PICA. The left vertebral supplies the basilar. There is left V4 calcified plaque resulting in moderate to severea stenosis (series 9, images 202 and 205. The basilar is patent but irregular with faint calcified plaque. SCA and left PCA origins are patent and within normal limits. Fetal type right PCA. Left posterior communicating artery diminutive or absent. Left PCA branches are mildly irregular. Right PCA branches are mildly to moderately irregular. Anterior circulation: The left ICA siphon is patent but with perhaps mildly asymmetric decreased enhancement compared to the right. Left siphon calcified plaque is moderate. And there is moderate stenosis in the cavernous segment. Normal left posterior communicating and ophthalmic artery origins. The supraclinoid left ICA and left ICA terminus are diminutive but patent. Contralateral right ICA siphon is patent with moderate plaque but only mild stenosis. Patent right ICA terminus. Normal MCA origins. Normal right ACA origin. The left A1 appears non dominant but there is moderate to severe stenosis at its origin. Anterior communicating artery is present. Bilateral ACA branches are patent with mild irregularity. The left A2 is dominant. Left MCA M1 segment and bifurcation are patent with mild irregularity. No left MCA branch occlusion is identified. Right MCA M1 segment and bifurcation are patent. No right MCA branch occlusion  identified. Venous sinuses: Patent on the delayed images. Anatomic variants: Dominant left vertebral artery which arises directly from the arch and supplies the basilar. Fetal type right PCA origin. Delayed phase: No abnormal enhancement identified. Review of the MIP images confirms the above findings IMPRESSION: 1. The Left ICA is patent despite tandem RADIOGRAPHIC STRING SIGN stenoses and near occlusion at its origin. Superimposed moderate Left ICA siphon stenosis due to bulky calcified plaque. No Left MCA branch occlusion identified. 2. Severely age advanced atherosclerosis in the head and neck. Other hemodynamically significant stenoses: - Right ICA origin severe (70%). - dominant Left Vertebral Artery origin (moderate) and V4 segment (moderate to severe). Note that this is the Basilar Artery supply. - Left ACA origin (moderate to severe). - non dominant Right Vertebral Artery origin (severe, near occlusion). 3. Expected CT appearance of multifocal left hemisphere infarcts. No hemorrhage or mass effect. Electronically Signed   By: Genevie Ann M.D.   On: 12/31/2018 15:05   Ct Angio Neck W Or Wo Contrast  Result  Date: 12/31/2018 CLINICAL DATA:  58 year old female with scattered acute infarcts in the left hemisphere on brain MRI 2 days ago and evidence of occlusion or poor flow in the left ICA siphon on that exam. Left ICA occlusion suspected on Doppler ultrasound that day. EXAM: CT ANGIOGRAPHY HEAD AND NECK TECHNIQUE: Multidetector CT imaging of the head and neck was performed using the standard protocol during bolus administration of intravenous contrast. Multiplanar CT image reconstructions and MIPs were obtained to evaluate the vascular anatomy. Carotid stenosis measurements (when applicable) are obtained utilizing NASCET criteria, using the distal internal carotid diameter as the denominator. CONTRAST:  70mL ISOVUE-370 IOPAMIDOL (ISOVUE-370) INJECTION 76% COMPARISON:  Doppler ultrasound 01/08/2019. Brain MRI  01/08/19. head CT 01-08-2019. FINDINGS: CT HEAD Brain: No midline shift, mass effect, or evidence of intracranial mass lesion. No acute intracranial hemorrhage identified. Scattered cortical and white matter hypodensity in the left hemisphere which is more pronounced from the January 08, 2019 CT and corresponds to areas of restricted diffusion by MRI. Stable gray-white matter differentiation elsewhere. Calvarium and skull base: No acute osseous abnormality identified. Paranasal sinuses: Visualized paranasal sinuses and mastoids are stable and well pneumatized. Orbits: Negative orbits and scalp soft tissues. CTA NECK Skeleton: Absent dentition. No acute osseous abnormality identified. Upper chest: Large body habitus. Negative lung apices and visible superior mediastinum. Other neck: Partially retropharyngeal course of the left carotid, normal variant. Otherwise negative; no neck mass or lymphadenopathy. Aortic arch: 4 vessel arch configuration, the left vertebral artery arises directly from the arch. Soft and calcified arch and proximal great vessel atherosclerosis. Right carotid system: No brachiocephalic artery stenosis despite plaque. Soft plaque in the proximal right CCA without stenosis. At the right carotid bifurcation there is complex soft and calcified plaque resulting in a short segment high-grade stenosis on series 9, image 151 and series 12, image 134. Numerically this is estimated at 70%. Plaque continues to the distal right ICA bulb. The vessel remains patent with no additional stenosis below the skull base. Left carotid system: Soft and calcified plaque at the left CCA origin resulting in less than 50% stenosis with respect to the distal vessel. Widespread low-density plaque in the medial left CCA in the neck. Diffuse vessel irregularity to the bifurcation. At the left ICA origin and bulb there are tandem radiographic string sign stenoses (series 12, images 170 and 172) and near occlusion of the vessel.  However, it does remain patent with preserved enhancement to the skull base. There is calcified plaque below the skull base which is not hemodynamically significant. Vertebral arteries: No proximal right subclavian artery origin despite atherosclerosis. Soft and calcified plaque at the left vertebral artery origin results in high-grade stenosis, near occlusion. The right vertebral is non dominant and diminutive but remains patent to the skull base. The left vertebral arises directly from the arch with soft and calcified plaque at its origin resulting in moderate stenosis. The left vertebral is dominant and has a late entry into the cervical transverse foramen. It is patent to the skull base without additional stenosis. CTA HEAD Posterior circulation: Diminutive right vertebral artery functionally terminates in PICA. The left vertebral supplies the basilar. There is left V4 calcified plaque resulting in moderate to severea stenosis (series 9, images 202 and 205. The basilar is patent but irregular with faint calcified plaque. SCA and left PCA origins are patent and within normal limits. Fetal type right PCA. Left posterior communicating artery diminutive or absent. Left PCA branches are mildly irregular. Right PCA branches are mildly to  moderately irregular. Anterior circulation: The left ICA siphon is patent but with perhaps mildly asymmetric decreased enhancement compared to the right. Left siphon calcified plaque is moderate. And there is moderate stenosis in the cavernous segment. Normal left posterior communicating and ophthalmic artery origins. The supraclinoid left ICA and left ICA terminus are diminutive but patent. Contralateral right ICA siphon is patent with moderate plaque but only mild stenosis. Patent right ICA terminus. Normal MCA origins. Normal right ACA origin. The left A1 appears non dominant but there is moderate to severe stenosis at its origin. Anterior communicating artery is present. Bilateral  ACA branches are patent with mild irregularity. The left A2 is dominant. Left MCA M1 segment and bifurcation are patent with mild irregularity. No left MCA branch occlusion is identified. Right MCA M1 segment and bifurcation are patent. No right MCA branch occlusion identified. Venous sinuses: Patent on the delayed images. Anatomic variants: Dominant left vertebral artery which arises directly from the arch and supplies the basilar. Fetal type right PCA origin. Delayed phase: No abnormal enhancement identified. Review of the MIP images confirms the above findings IMPRESSION: 1. The Left ICA is patent despite tandem RADIOGRAPHIC STRING SIGN stenoses and near occlusion at its origin. Superimposed moderate Left ICA siphon stenosis due to bulky calcified plaque. No Left MCA branch occlusion identified. 2. Severely age advanced atherosclerosis in the head and neck. Other hemodynamically significant stenoses: - Right ICA origin severe (70%). - dominant Left Vertebral Artery origin (moderate) and V4 segment (moderate to severe). Note that this is the Basilar Artery supply. - Left ACA origin (moderate to severe). - non dominant Right Vertebral Artery origin (severe, near occlusion). 3. Expected CT appearance of multifocal left hemisphere infarcts. No hemorrhage or mass effect. Electronically Signed   By: Genevie Ann M.D.   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 Does the need for close, 24 hr/day medical supervision in concert with the patient's rehab needs make it unreasonable for this patient to be served in a less intensive setting? Potentially 2. Co-Morbidities requiring supervision/potential complications: COPD, morbid obesity, htn 3. Due to bladder management, bowel management, safety, skin/wound care, disease management, medication administration and patient education, does the patient require 24 hr/day rehab nursing? Yes 4. Does the  patient require coordinated care of a physician, rehab nurse, PT (1-2 hrs/day, 5 days/week), OT (1-2 hrs/day, 5 days/week) and SLP (1-2 hrs/day, 5 days/week) to address physical and functional deficits in the context of the above medical diagnosis(es)? Yes and Potentially Addressing deficits in the following areas: balance, endurance, locomotion, strength, transferring, bowel/bladder control, bathing, dressing, feeding, language and psychosocial support 5. Can the patient actively participate in an intensive therapy program of at least 3 hrs of therapy per day at least 5 days per week? Yes and Potentially 6. The potential for patient to make measurable gains while on inpatient rehab is good 7. Anticipated functional outcomes upon discharge from inpatient rehab are modified independent  with PT, modified independent with OT, modified independent, supervision and min assist with SLP. 8. Estimated rehab length of stay to reach the above functional goals is: potentially 7-12 days 9. Anticipated D/C setting: Home 10. Anticipated post D/C treatments: HH therapy and Outpatient therapy 11. Overall Rehab/Functional Prognosis: excellent  RECOMMENDATIONS: This patient's condition is appropriate for continued rehabilitative care in the following setting: likely CIR Patient has agreed to participate in recommended program. Potentially Note that insurance prior authorization may be  required for reimbursement for recommended care.  Comment: Awaiting definite surgical plan. Family mentioned that surgery is tentatively planned for Thursday on left ICA. Will follow along for ongoing neurological progress and med/surg plan.   Meredith Staggers, MD, [place] 01/01/2019    Meredith Staggers, MD 01/01/2019

## 2019-01-01 NOTE — Progress Notes (Signed)
PROGRESS NOTE  Madison Medina DGU:[id number] DOB: [birth year] DOA: 12/30/2018 PCP: Madison Roche, NP  HPI/Recap of past 24 hours: Madison Medina is a 58 y.o. female with medical history significant for HTN, obesity, HLD, hypothyroidism, COPD and 25 pack-year smoking hx (current smoker) who was transferred to [facility] today from [place] for acute CVA. The morning of Jan 15 she had a breast biopsy at [facility] and around that time (~0800) she had the acute onset of RUE tingling and numbness and shortly thereafter developed RLE weakness. The next morning around 0900 family noticed that pt was mumbling and had garbled speech. Patient herself was unaware she was speaking differently than usual and she was able to understand what people were saying to her. However later on in the day she became confused and was aware of it and felt she needed to go to the ED.  At [facility], she was afebrile, HR 68, RR 18, 97% on RA. She was hypertensive at 189/88. Dysarthria and confusion had resolved but R arm weakness remained. Pt was able to answer "yes" or "no" to questions but otherwise unable to speak. Labs were remarkabel for hyponatremia at 132, slight increase in baseline creatinine at 1.1 (baseline appears to be ~0.7 - 0.8). Labs o/w were unremarkable. Head CT was negative but brain MRI revealed patchy acute infarcts in the L cerebral hemisphere with involvement of the superficial and deep watershed zones, new abnormal appearance of the L ICA. CTA head/neck showed near occlusion of L proximal ICA with severe thread-like stenosis with mixed plaque. R prox ICA had severe 90% stenosis with mixed plaque. tPA was not given due to pt being out of the window. She was given ASA 325 mg and teleneurology consult was obtained. Decision was made to transfer pt to [facility].  12/31/18: Seen and examined with her husband and other family members at her bedside.  She appears to have both expressive and receptive aphasia.  Right upper extremity weakness  persists with 3 out of 5 strength on exam.  PT OT assessed and recommended CIR.  CIR consulted for inpatient rehab admission eval.  01/01/19: Seen and examined with her husband at her bedside. A little more talkative today. Per husband her speech is improving.   Assessment/Plan: Principal Problem:   CVA (cerebral vascular accident) ([place]) Active Problems:   HTN (hypertension)   Anxiety   Hypothyroid   Familial combined hyperlipidemia   Tobacco use   COPD (chronic obstructive pulmonary disease) (HCC)   AKI (acute kidney injury) ([place])   Hyponatremia  Acute ischemic left cerebral CVA possibly secondary to severe symptomatic left carotid stenosis MRI done at [facility] revealed: Patchy acute infarcts left cerebral hemisphere with involvement of superficial and deep watershed zones. New abnormal appearance of the left ICA. 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 Right proximal ICA severe 90% stenosis with mixed plaque. Anterior left vertebral artery origin moderate 50% stenosis. Patent diminutive right vertebral artery.  Diminutive left ICA downstream to bifurcation, stenosis likely due to chronic slow flow. Patent anterior and posterior intracranial circulation.  No large vessel occlusion, aneurysm or significant stenosis is identified. Neurology following Vascular surgery has been consulted; recommended repeating CTA neck  CTA head and neck with and without contrast  PT/OT/speech have assessed and recommended CIR CIR has been consulted Continue physical therapy Fall precautions On full dose aspirin and high-dose Lipitor, continue LDL 183 with a goal LDL of less than 70 A1c  5.7 with goal A1c less than 7.0 Continue to monitor vital signs on telemetry Ongoing permissive hypertension  Symptomatic left carotid stenosis Possibly reason for her CVA Vascular surgery planning on left carotid endarterectomy for stroke  prevention on Wednesday, January 04, 2019 Needs cardiac evaluation We will consult cardiology  Expressive aphasia post CVA Speech therapy following Possible transfer to CIR  Severe bilateral carotid artery stenosis Management as stated above Vascular surgery has been consulted, Dr Trula Slade  Uncontrolled hyperlipidemia LDL 183 with a goal LDL of less than 70 On Lipitor 80 mg daily  Current tobacco user Tobacco cessation counseling at bedside  Morbid obesity BMI 27 Recommend weight loss outpatient Might require short-term rehab prior to discharge home  Chronic anxiety/depression Continue Celexa  Hypothyroidism Continue levothyroxine  Hypertension Hold off antihypertensive medication for permissive hypertension  GERD Continue omeprazole  DVT prophylaxis: sq lovenox daily Code Status:  Full  Family Communication:  Husband at bedside.  All questions answered to his satisfaction.    Disposition Plan: Possibly dc to CIR pending insurance authorization.  Consults called: neurology, vascular surgery, cardiology    Objective: Vitals:   12/31/18 2354 01/01/19 0405 01/01/19 0811 01/01/19 1142  BP: (!) 157/70 (!) 156/73 (!) 160/76 (!) 169/76  Pulse: 61 (!) 58 61 61  Resp: 20 20 20 20   Temp: 98 F (36.7 C) (!) 97.5 F (36.4 C) 97.7 F (36.5 C) 97.8 F (36.6 C)  TempSrc: Oral Oral Oral Oral  SpO2: 96% 95% 97% 100%  Weight:      Height:        Intake/Output Summary (Last 24 hours) at 01/01/2019 1446 Last data filed at 01/01/2019 1100 Gross per 24 hour  Intake 3113.33 ml  Output 800 ml  Net 2313.33 ml   Filed Weights   12/31/18 1130  Weight: 121.7 kg    Exam:  . General: 58 y.o. year-old female well-developed well-nourished in no acute distress.  Alert with moderate expressive aphasia.   . Cardiovascular: Regular rate and rhythm with no rubs or gallops.  No JVD or thyromegaly noted. Marland Kitchen Respiratory: Clear to Auscultation with No Wheezes or Rales.  Good  Inspiratory Effort. . Abdomen: Soft nontender nondistended with normal bowel sounds x4 quadrants. . Musculoskeletal: No lower extremity edema. 2/4 pulses in all 4 extremities. RUE 3/5 strength. . Skin: No ulcerative lesions noted or rashes, . Psychiatry: Mood is appropriate for condition and setting   Data Reviewed: CBC: Recent Labs  Lab 12/30/18 1539 12/31/18 0529 01/01/19 0708  WBC 10.2 10.9* 8.8  NEUTROABS 7.5  --   --   HGB 13.2 13.2 12.0  HCT 41.0 39.5 37.2  MCV 92.3 93.4 92.8  PLT 249 286 401   Basic Metabolic Panel: Recent Labs  Lab 12/30/18 1539 12/31/18 0529 01/01/19 0708  NA 132* 133* 134*  K 4.3 4.1 3.8  CL 98 98 101  CO2 24 24 22   GLUCOSE 99 90 91  BUN 19 21* 14  CREATININE 1.08* 0.99 0.78  CALCIUM 8.7* 8.6* 7.9*   GFR: Estimated Creatinine Clearance: 98.6 mL/min (by C-G formula based on SCr of 0.78 mg/dL). Liver Function Tests: Recent Labs  Lab 12/30/18 1539  AST 24  ALT 21  ALKPHOS 54  BILITOT 0.6  PROT 7.1  ALBUMIN 3.4*   No results for input(s): LIPASE, AMYLASE in the last 168 hours. No results for input(s): AMMONIA in the last 168 hours. Coagulation Profile: Recent Labs  Lab 12/30/18 1539  INR 1.02   Cardiac Enzymes:  No results for input(s): CKTOTAL, CKMB, CKMBINDEX, TROPONINI in the last 168 hours. BNP (last 3 results) No results for input(s): PROBNP in the last 8760 hours. HbA1C: Recent Labs    12/31/18 0529  HGBA1C 5.7*   CBG: Recent Labs  Lab 12/30/18 1155  GLUCAP 107*   Lipid Profile: Recent Labs    12/31/18 0529  CHOL 245*  HDL 39*  LDLCALC 183*  TRIG 117  CHOLHDL 6.3   Thyroid Function Tests: No results for input(s): TSH, T4TOTAL, FREET4, T3FREE, THYROIDAB in the last 72 hours. Anemia Panel: No results for input(s): VITAMINB12, FOLATE, FERRITIN, TIBC, IRON, RETICCTPCT in the last 72 hours. Urine analysis:    Component Value Date/Time   COLORURINE YELLOW 11/14/2009 [place] 11/14/2009  0955   LABSPEC 1.006 11/14/2009 0955   PHURINE 6.0 11/14/2009 [place] 11/14/2009 0955   HGBUR NEGATIVE 11/14/2009 0955   BILIRUBINUR NEGATIVE 11/14/2009 0955   KETONESUR NEGATIVE 11/14/2009 0955   PROTEINUR NEGATIVE 11/14/2009 0955   UROBILINOGEN 0.2 11/14/2009 0955   NITRITE NEGATIVE 11/14/2009 0955   LEUKOCYTESUR SMALL (A) 11/14/2009 0955   Sepsis Labs: @LABRCNTIP (procalcitonin:4,lacticidven:4)  )No results found for this or any previous visit (from the past 240 hour(s)).    Studies: No results found.  Scheduled Meds: . aspirin EC  325 mg Oral Daily  . atorvastatin  80 mg Oral Daily  . celecoxib  200 mg Oral BID  . citalopram  20 mg Oral Daily  . enoxaparin (LOVENOX) injection  40 mg Subcutaneous Q24H  . famotidine  40 mg Oral QHS  . levothyroxine  25 mcg Oral Q0600  . montelukast  10 mg Oral Daily  . pantoprazole  40 mg Oral Daily    Continuous Infusions: . sodium chloride 100 mL/hr at 01/01/19 0919     LOS: 2 days     Kayleen Memos, MD [facility] Pager [phone]  If 7PM-7AM, please contact night-coverage [email or web address] Password [facility] 01/01/2019, 2:46 PM

## 2019-01-01 NOTE — Consult Note (Signed)
Cardiology Consultation:   Patient ID: Madison Medina MRN: [id number]; DOB: January 07, 1961  Admit date: 12/30/2018 Date of Consult: 01/01/2019  Primary Care Provider: Welford Roche, NP Primary Cardiologist: new, [place]  Primary Electrophysiologist:  None    Patient Profile:   Madison Medina is a 58 y.o. female with a hx of hypertension, obesity, hyperlipidemia, hypothyroidism, COPD and ongoing cigarette abuse who is being seen today for the evaluation of preoperative evaluation prior to carotid endarterectomy at the request of  Dr. [place] Crane.  History of Present Illness:   Madison Medina is a 58 year old female with a history of hypertension, hyperlipidemia and obesity, and an 80-pack-year smoking history.  She presents with a stroke.  She has been found to have a tight proximal right ICA stenosis of 90%.  She is scheduled for carotid endarterectomy.  We are asked to see her for preoperative evaluation.  She is morbidly obese.  She does not get any exercise.  She does not watch her diet.  She does not limit her salt.  Denies any chest pain or shortness of breath doing her normal activities.  She denies any rash or fever.  She denies any nausea vomiting or diarrhea. She denies any syncope or presyncope  Past Medical History:  Diagnosis Date  . Asthma   . Back pain   . COPD (chronic obstructive pulmonary disease) ([place])   . Hyperlipidemia   . Hypertension   . Hypothyroidism   . Stroke [facility])     Past Surgical History:  Procedure Laterality Date  . BREAST BIOPSY  12/2018   at breast center in [place]  . CARPAL TUNNEL RELEASE    . CYST EXCISION       Home Medications:  Prior to Admission medications   Medication Sig Start Date End Date Taking? Authorizing Provider  acetaminophen (TYLENOL) 325 MG tablet Take 650 mg by mouth every 6 (six) hours as needed for mild pain, fever or headache.   Yes [provider]  amLODipine (NORVASC) 5 MG tablet Take 5 mg by mouth daily.  09/30/17   Yes [provider]  atorvastatin (LIPITOR) 80 MG tablet Take 80 mg by mouth daily at 6 PM.  05/20/17  Yes [provider]  celecoxib (CELEBREX) 200 MG capsule Take 200 mg by mouth 2 (two) times daily.  01/27/17  Yes [provider]  citalopram (CELEXA) 20 MG tablet Take 20 mg by mouth daily. 12/12/18  Yes [provider]  famotidine (PEPCID) 40 MG tablet Take 40 mg by mouth at bedtime. 12/12/18  Yes [provider]  hydrochlorothiazide (HYDRODIURIL) 25 MG tablet Take 25 mg by mouth daily.  08/31/17  Yes [provider]  levothyroxine (SYNTHROID, LEVOTHROID) 200 MCG tablet Take 200 mcg by mouth daily before breakfast. (in addition to the 51mcg tablet)   Yes [provider]  levothyroxine (SYNTHROID, LEVOTHROID) 25 MCG tablet Take 25 mcg by mouth daily before breakfast. (in addition to the 215mcg tablet) 12/19/18  Yes [provider]  lisinopril (PRINIVIL,ZESTRIL) 20 MG tablet Take 20 mg by mouth 2 (two) times daily.    Yes [provider]  montelukast (SINGULAIR) 10 MG tablet Take 10 mg by mouth at bedtime.  02/05/17  Yes [provider]  omeprazole (PRILOSEC) 40 MG capsule Take 40 mg by mouth daily. 12/12/18  Yes [provider]  oxyCODONE-acetaminophen (PERCOCET) 7.5-325 MG tablet Take 1 tablet by mouth 3 (three) times daily.  07/09/16  Yes [provider]  Inpatient Medications: Scheduled Meds: . aspirin EC  325 mg Oral Daily  . atorvastatin  80 mg Oral Daily  . celecoxib  200 mg Oral BID  . citalopram  20 mg Oral Daily  . enoxaparin (LOVENOX) injection  40 mg Subcutaneous Q24H  . famotidine  40 mg Oral QHS  . levothyroxine  25 mcg Oral Q0600  . montelukast  10 mg Oral Daily  . pantoprazole  40 mg Oral Daily   Continuous Infusions: . sodium chloride 100 mL/hr at 01/01/19 0919   PRN Meds: acetaminophen **OR** acetaminophen (TYLENOL) oral liquid 160 mg/5 mL **OR** acetaminophen,  oxyCODONE-acetaminophen, senna-docusate  Allergies:   Not on File  Social History:   Social History   Socioeconomic History  . Marital status: Married    Spouse name: Not on file  . Number of children: Not on file  . Years of education: Not on file  . Highest education level: Not on file  Occupational History  . Not on file  Social Needs  . Financial resource strain: Not on file  . Food insecurity:    Worry: Not on file    Inability: Not on file  . Transportation needs:    Medical: Not on file    Non-medical: Not on file  Tobacco Use  . Smoking status: Current Every Day Smoker    Packs/day: 2.00    Years: 40.00    Pack years: 80.00    Types: Cigarettes  . Smokeless tobacco: Never Used  Substance and Sexual Activity  . Alcohol use: Not Currently    Frequency: Never  . Drug use: Never  . Sexual activity: Not on file  Lifestyle  . Physical activity:    Days per week: Not on file    Minutes per session: Not on file  . Stress: Not on file  Relationships  . Social connections:    Talks on phone: Not on file    Gets together: Not on file    Attends religious service: Not on file    Active member of club or organization: Not on file    Attends meetings of clubs or organizations: Not on file    Relationship status: Not on file  . Intimate partner violence:    Fear of current or ex partner: Not on file    Emotionally abused: Not on file    Physically abused: Not on file    Forced sexual activity: Not on file  Other Topics Concern  . Not on file  Social History Narrative  . Not on file    Family History:    Family History  Problem Relation Age of Onset  . High blood pressure Other   . Diabetes Other   . Cancer Other   . Stroke Other   . Heart disease Other      ROS:  Please see the history of present illness.   All other ROS reviewed and negative.     Physical Exam/Data:   Vitals:   12/31/18 2354 01/01/19 0405 01/01/19 0811 01/01/19 1142  BP: (!)  157/70 (!) 156/73 (!) 160/76 (!) 169/76  Pulse: 61 (!) 58 61 61  Resp: 20 20 20 20   Temp: 98 F (36.7 C) (!) 97.5 F (36.4 C) 97.7 F (36.5 C) 97.8 F (36.6 C)  TempSrc: Oral Oral Oral Oral  SpO2: 96% 95% 97% 100%  Weight:      Height:        Intake/Output Summary (Last 24 hours) at 01/01/2019 1553  Last data filed at 01/01/2019 1100 Gross per 24 hour  Intake 3113.33 ml  Output 800 ml  Net 2313.33 ml   Last 3 Weights 12/31/2018  Weight (lbs) 268 lb 4.8 oz  Weight (kg) 121.7 kg     Body mass index is 46.05 kg/m.  General: Morbidly obese female no acute distress HEENT: normal Lymph: no adenopathy Neck: no JVD Endocrine:  No thryomegaly Vascular: No carotid bruits; FA pulses 2+ bilaterally without bruits  Cardiac: Rate S1-S2. Lungs:  clear to auscultation bilaterally, no wheezing, rhonchi or rales  Abd: Really obese. Ext: no edema Musculoskeletal:  No deformities, BUE and BLE strength normal and equal Skin: warm and dry  Neuro:  CNs 2-12 intact, no focal abnormalities noted Psych:  Normal affect   EKG:  The EKG was personally reviewed and demonstrates:  Pending  Telemetry:  Telemetry was personally reviewed and demonstrates:     Relevant CV Studies:   Laboratory Data:  Chemistry Recent Labs  Lab 12/30/18 1539 12/31/18 0529 01/01/19 0708  NA 132* 133* 134*  K 4.3 4.1 3.8  CL 98 98 101  CO2 24 24 22   GLUCOSE 99 90 91  BUN 19 21* 14  CREATININE 1.08* 0.99 0.78  CALCIUM 8.7* 8.6* 7.9*  GFRNONAA 57* >60 >60  GFRAA >60 >60 >60  ANIONGAP 10 11 11     Recent Labs  Lab 12/30/18 1539  PROT 7.1  ALBUMIN 3.4*  AST 24  ALT 21  ALKPHOS 54  BILITOT 0.6   Hematology Recent Labs  Lab 12/30/18 1539 12/31/18 0529 01/01/19 0708  WBC 10.2 10.9* 8.8  RBC 4.44 4.23 4.01  HGB 13.2 13.2 12.0  HCT 41.0 39.5 37.2  MCV 92.3 93.4 92.8  MCH 29.7 31.2 29.9  MCHC 32.2 33.4 32.3  RDW 14.6 14.6 14.4  PLT 249 286 234   Cardiac EnzymesNo results for input(s):  TROPONINI in the last 168 hours. No results for input(s): TROPIPOC in the last 168 hours.  BNPNo results for input(s): BNP, PROBNP in the last 168 hours.  DDimer No results for input(s): DDIMER in the last 168 hours.  Radiology/Studies:  Ct Angio Head W Or Wo Contrast  Result Date: 12/31/2018 CLINICAL DATA:  58 year old female with scattered acute infarcts in the left hemisphere on brain MRI 2 days ago and evidence of occlusion or poor flow in the left ICA siphon on that exam. Left ICA occlusion suspected on Doppler ultrasound that day. EXAM: CT ANGIOGRAPHY HEAD AND NECK TECHNIQUE: Multidetector CT imaging of the head and neck was performed using the standard protocol during bolus administration of intravenous contrast. Multiplanar CT image reconstructions and MIPs were obtained to evaluate the vascular anatomy. Carotid stenosis measurements (when applicable) are obtained utilizing NASCET criteria, using the distal internal carotid diameter as the denominator. CONTRAST:  63mL ISOVUE-370 IOPAMIDOL (ISOVUE-370) INJECTION 76% COMPARISON:  Doppler ultrasound 2019/01/21. Brain MRI 2019/01/21. head CT 01-21-19. FINDINGS: CT HEAD Brain: No midline shift, mass effect, or evidence of intracranial mass lesion. No acute intracranial hemorrhage identified. Scattered cortical and white matter hypodensity in the left hemisphere which is more pronounced from the 21-Jan-2019 CT and corresponds to areas of restricted diffusion by MRI. Stable gray-white matter differentiation elsewhere. Calvarium and skull base: No acute osseous abnormality identified. Paranasal sinuses: Visualized paranasal sinuses and mastoids are stable and well pneumatized. Orbits: Negative orbits and scalp soft tissues. CTA NECK Skeleton: Absent dentition. No acute osseous abnormality identified. Upper chest: Large body habitus. Negative lung apices and visible superior  mediastinum. Other neck: Partially retropharyngeal course of the left carotid, normal  variant. Otherwise negative; no neck mass or lymphadenopathy. Aortic arch: 4 vessel arch configuration, the left vertebral artery arises directly from the arch. Soft and calcified arch and proximal great vessel atherosclerosis. Right carotid system: No brachiocephalic artery stenosis despite plaque. Soft plaque in the proximal right CCA without stenosis. At the right carotid bifurcation there is complex soft and calcified plaque resulting in a short segment high-grade stenosis on series 9, image 151 and series 12, image 134. Numerically this is estimated at 70%. Plaque continues to the distal right ICA bulb. The vessel remains patent with no additional stenosis below the skull base. Left carotid system: Soft and calcified plaque at the left CCA origin resulting in less than 50% stenosis with respect to the distal vessel. Widespread low-density plaque in the medial left CCA in the neck. Diffuse vessel irregularity to the bifurcation. At the left ICA origin and bulb there are tandem radiographic string sign stenoses (series 12, images 170 and 172) and near occlusion of the vessel. However, it does remain patent with preserved enhancement to the skull base. There is calcified plaque below the skull base which is not hemodynamically significant. Vertebral arteries: No proximal right subclavian artery origin despite atherosclerosis. Soft and calcified plaque at the left vertebral artery origin results in high-grade stenosis, near occlusion. The right vertebral is non dominant and diminutive but remains patent to the skull base. The left vertebral arises directly from the arch with soft and calcified plaque at its origin resulting in moderate stenosis. The left vertebral is dominant and has a late entry into the cervical transverse foramen. It is patent to the skull base without additional stenosis. CTA HEAD Posterior circulation: Diminutive right vertebral artery functionally terminates in PICA. The left vertebral  supplies the basilar. There is left V4 calcified plaque resulting in moderate to severea stenosis (series 9, images 202 and 205. The basilar is patent but irregular with faint calcified plaque. SCA and left PCA origins are patent and within normal limits. Fetal type right PCA. Left posterior communicating artery diminutive or absent. Left PCA branches are mildly irregular. Right PCA branches are mildly to moderately irregular. Anterior circulation: The left ICA siphon is patent but with perhaps mildly asymmetric decreased enhancement compared to the right. Left siphon calcified plaque is moderate. And there is moderate stenosis in the cavernous segment. Normal left posterior communicating and ophthalmic artery origins. The supraclinoid left ICA and left ICA terminus are diminutive but patent. Contralateral right ICA siphon is patent with moderate plaque but only mild stenosis. Patent right ICA terminus. Normal MCA origins. Normal right ACA origin. The left A1 appears non dominant but there is moderate to severe stenosis at its origin. Anterior communicating artery is present. Bilateral ACA branches are patent with mild irregularity. The left A2 is dominant. Left MCA M1 segment and bifurcation are patent with mild irregularity. No left MCA branch occlusion is identified. Right MCA M1 segment and bifurcation are patent. No right MCA branch occlusion identified. Venous sinuses: Patent on the delayed images. Anatomic variants: Dominant left vertebral artery which arises directly from the arch and supplies the basilar. Fetal type right PCA origin. Delayed phase: No abnormal enhancement identified. Review of the MIP images confirms the above findings IMPRESSION: 1. The Left ICA is patent despite tandem RADIOGRAPHIC STRING SIGN stenoses and near occlusion at its origin. Superimposed moderate Left ICA siphon stenosis due to bulky calcified plaque. No Left MCA branch occlusion  identified. 2. Severely age advanced  atherosclerosis in the head and neck. Other hemodynamically significant stenoses: - Right ICA origin severe (70%). - dominant Left Vertebral Artery origin (moderate) and V4 segment (moderate to severe). Note that this is the Basilar Artery supply. - Left ACA origin (moderate to severe). - non dominant Right Vertebral Artery origin (severe, near occlusion). 3. Expected CT appearance of multifocal left hemisphere infarcts. No hemorrhage or mass effect. Electronically Signed   By: Genevie Ann M.D.   On: 12/31/2018 15:05   Ct Angio Neck W Or Wo Contrast  Result Date: 12/31/2018 CLINICAL DATA:  58 year old female with scattered acute infarcts in the left hemisphere on brain MRI 2 days ago and evidence of occlusion or poor flow in the left ICA siphon on that exam. Left ICA occlusion suspected on Doppler ultrasound that day. EXAM: CT ANGIOGRAPHY HEAD AND NECK TECHNIQUE: Multidetector CT imaging of the head and neck was performed using the standard protocol during bolus administration of intravenous contrast. Multiplanar CT image reconstructions and MIPs were obtained to evaluate the vascular anatomy. Carotid stenosis measurements (when applicable) are obtained utilizing NASCET criteria, using the distal internal carotid diameter as the denominator. CONTRAST:  38mL ISOVUE-370 IOPAMIDOL (ISOVUE-370) INJECTION 76% COMPARISON:  Doppler ultrasound 01/10/19. Brain MRI 2019/01/10. head CT January 10, 2019. FINDINGS: CT HEAD Brain: No midline shift, mass effect, or evidence of intracranial mass lesion. No acute intracranial hemorrhage identified. Scattered cortical and white matter hypodensity in the left hemisphere which is more pronounced from the 2019/01/10 CT and corresponds to areas of restricted diffusion by MRI. Stable gray-white matter differentiation elsewhere. Calvarium and skull base: No acute osseous abnormality identified. Paranasal sinuses: Visualized paranasal sinuses and mastoids are stable and well pneumatized.  Orbits: Negative orbits and scalp soft tissues. CTA NECK Skeleton: Absent dentition. No acute osseous abnormality identified. Upper chest: Large body habitus. Negative lung apices and visible superior mediastinum. Other neck: Partially retropharyngeal course of the left carotid, normal variant. Otherwise negative; no neck mass or lymphadenopathy. Aortic arch: 4 vessel arch configuration, the left vertebral artery arises directly from the arch. Soft and calcified arch and proximal great vessel atherosclerosis. Right carotid system: No brachiocephalic artery stenosis despite plaque. Soft plaque in the proximal right CCA without stenosis. At the right carotid bifurcation there is complex soft and calcified plaque resulting in a short segment high-grade stenosis on series 9, image 151 and series 12, image 134. Numerically this is estimated at 70%. Plaque continues to the distal right ICA bulb. The vessel remains patent with no additional stenosis below the skull base. Left carotid system: Soft and calcified plaque at the left CCA origin resulting in less than 50% stenosis with respect to the distal vessel. Widespread low-density plaque in the medial left CCA in the neck. Diffuse vessel irregularity to the bifurcation. At the left ICA origin and bulb there are tandem radiographic string sign stenoses (series 12, images 170 and 172) and near occlusion of the vessel. However, it does remain patent with preserved enhancement to the skull base. There is calcified plaque below the skull base which is not hemodynamically significant. Vertebral arteries: No proximal right subclavian artery origin despite atherosclerosis. Soft and calcified plaque at the left vertebral artery origin results in high-grade stenosis, near occlusion. The right vertebral is non dominant and diminutive but remains patent to the skull base. The left vertebral arises directly from the arch with soft and calcified plaque at its origin resulting in  moderate stenosis. The left vertebral is dominant and  has a late entry into the cervical transverse foramen. It is patent to the skull base without additional stenosis. CTA HEAD Posterior circulation: Diminutive right vertebral artery functionally terminates in PICA. The left vertebral supplies the basilar. There is left V4 calcified plaque resulting in moderate to severea stenosis (series 9, images 202 and 205. The basilar is patent but irregular with faint calcified plaque. SCA and left PCA origins are patent and within normal limits. Fetal type right PCA. Left posterior communicating artery diminutive or absent. Left PCA branches are mildly irregular. Right PCA branches are mildly to moderately irregular. Anterior circulation: The left ICA siphon is patent but with perhaps mildly asymmetric decreased enhancement compared to the right. Left siphon calcified plaque is moderate. And there is moderate stenosis in the cavernous segment. Normal left posterior communicating and ophthalmic artery origins. The supraclinoid left ICA and left ICA terminus are diminutive but patent. Contralateral right ICA siphon is patent with moderate plaque but only mild stenosis. Patent right ICA terminus. Normal MCA origins. Normal right ACA origin. The left A1 appears non dominant but there is moderate to severe stenosis at its origin. Anterior communicating artery is present. Bilateral ACA branches are patent with mild irregularity. The left A2 is dominant. Left MCA M1 segment and bifurcation are patent with mild irregularity. No left MCA branch occlusion is identified. Right MCA M1 segment and bifurcation are patent. No right MCA branch occlusion identified. Venous sinuses: Patent on the delayed images. Anatomic variants: Dominant left vertebral artery which arises directly from the arch and supplies the basilar. Fetal type right PCA origin. Delayed phase: No abnormal enhancement identified. Review of the MIP images confirms the  above findings IMPRESSION: 1. The Left ICA is patent despite tandem RADIOGRAPHIC STRING SIGN stenoses and near occlusion at its origin. Superimposed moderate Left ICA siphon stenosis due to bulky calcified plaque. No Left MCA branch occlusion identified. 2. Severely age advanced atherosclerosis in the head and neck. Other hemodynamically significant stenoses: - Right ICA origin severe (70%). - dominant Left Vertebral Artery origin (moderate) and V4 segment (moderate to severe). Note that this is the Basilar Artery supply. - Left ACA origin (moderate to severe). - non dominant Right Vertebral Artery origin (severe, near occlusion). 3. Expected CT appearance of multifocal left hemisphere infarcts. No hemorrhage or mass effect. Electronically Signed   By: Genevie Ann M.D.   On: 12/31/2018 15:05    Assessment and Plan:   1. 1.  Preoperative evaluation prior to carotid endarterectomy: Patient has risk factors for coronary artery disease.  It is very difficult to assess her functional status because she really does not do anything.  She is morbidly obese.  Because of her obesity and large chest, I do not think that a stress Myoview study would necessarily be beneficial.  She would likely have significant attenuation.  We will schedule her for a coronary CT angiogram tomorrow morning.    I strongly advised her to stop smoking.  I strongly advised her to start an exercise program and to work on a better diet, exercise, weight loss program. We will be able to make further recommendations following the coronary CT angiogram        For questions or updates, please contact [place] Please consult [email or web address] for contact info under     Signed, Mertie Moores, MD  01/01/2019 3:53 PM

## 2019-01-02 ENCOUNTER — Inpatient Hospital Stay (HOSPITAL_COMMUNITY): Payer: Medicare Other

## 2019-01-02 DIAGNOSIS — I6389 Other cerebral infarction: Secondary | ICD-10-CM

## 2019-01-02 DIAGNOSIS — I2510 Atherosclerotic heart disease of native coronary artery without angina pectoris: Secondary | ICD-10-CM

## 2019-01-02 LAB — CBC
HCT: 36.5 % (ref 36.0–46.0)
Hemoglobin: 12 g/dL (ref 12.0–15.0)
MCH: 31.2 pg (ref 26.0–34.0)
MCHC: 32.9 g/dL (ref 30.0–36.0)
MCV: 94.8 fL (ref 80.0–100.0)
Platelets: 240 10*3/uL (ref 150–400)
RBC: 3.85 MIL/uL — ABNORMAL LOW (ref 3.87–5.11)
RDW: 14.6 % (ref 11.5–15.5)
WBC: 8.7 10*3/uL (ref 4.0–10.5)
nRBC: 0 % (ref 0.0–0.2)

## 2019-01-02 LAB — ECHOCARDIOGRAM COMPLETE
Height: 64 in
Weight: 4292.8 oz

## 2019-01-02 LAB — BASIC METABOLIC PANEL
ANION GAP: 9 (ref 5–15)
BUN: 12 mg/dL (ref 6–20)
CO2: 22 mmol/L (ref 22–32)
Calcium: 8 mg/dL — ABNORMAL LOW (ref 8.9–10.3)
Chloride: 104 mmol/L (ref 98–111)
Creatinine, Ser: 0.83 mg/dL (ref 0.44–1.00)
GFR calc Af Amer: >60 mL/min (ref >60)
GFR calc non Af Amer: >60 mL/min (ref >60)
Glucose, Bld: 96 mg/dL (ref 70–99)
Potassium: 4.3 mmol/L (ref 3.5–5.1)
Sodium: 135 mmol/L (ref 135–145)

## 2019-01-02 IMAGING — CT CT HEART MORP W/ CTA COR W/ SCORE W/ CA W/CM &/OR W/O CM
4 of 7 series · 8 of 20 positions shown, 9 images · IV contrast (APPLIED)
Comparison: Chest radiograph 12/29/2018. Complete chest CT of
06/04/2011.

Addendum:
EXAM:
OVER-READ INTERPRETATION  CT CHEST

The following report is an over-read performed by radiologist Dr.
John Ingar Valdersnes [[email or web address]] on 01/02/2019. This over-read
does not include interpretation of cardiac or coronary anatomy or
pathology. The coronary CTA interpretation by the cardiologist is
attached.
CLINICAL DATA: 57-year-old female with h/o carotid disease, morbid
obesity and HATIKE.
Cardiac/Coronary  CT
TECHNIQUE: The patient was scanned on a [employer] Force scanner.

[Series 8: best diast 71 % · axial · 0.44mm/px · z∈[-299,-248]mm · 2 of 378 slices shown, 3 images]
[im 126/378  vessel]
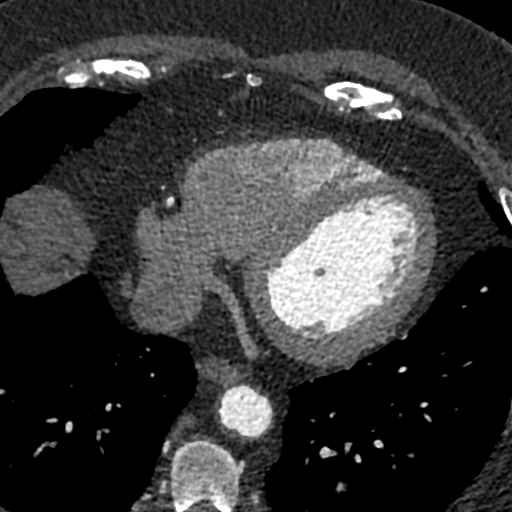
[im 126/378  lung]
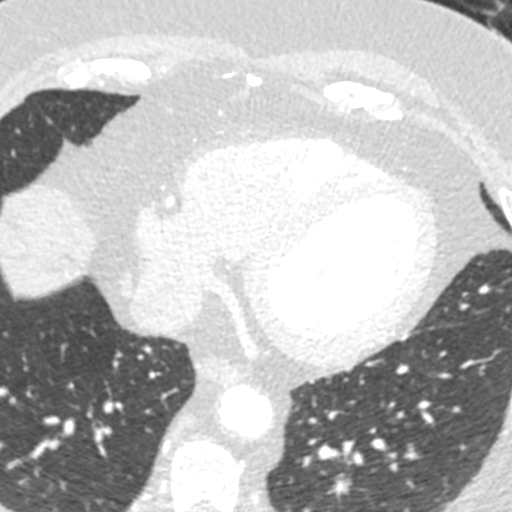
[im 252/378  vessel]
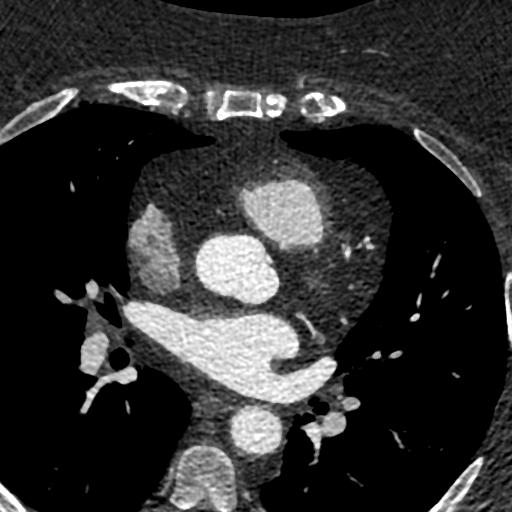

[Series 9: best syst 32 % · axial · 0.44mm/px · z∈[-299,-248]mm · 2 of 378 slices shown]
[im 126/378  vessel]
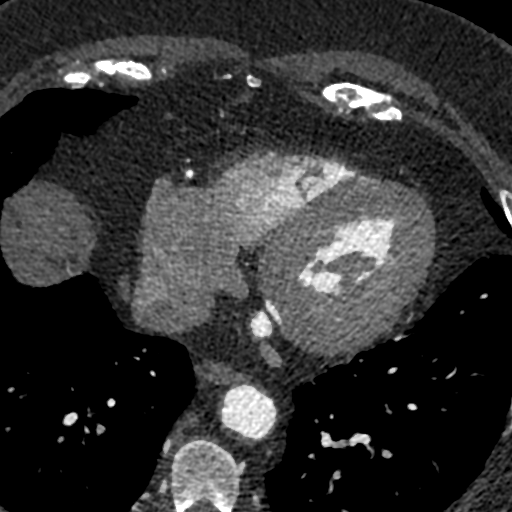
[im 252/378  vessel]
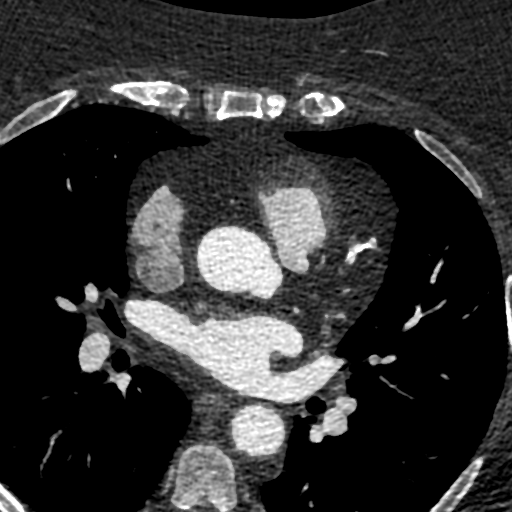

[Series 10: ts diast sharp 71 % · axial · 0.44mm/px · z∈[-299,-248]mm · 2 of 378 slices shown]
[im 126/378  lung]
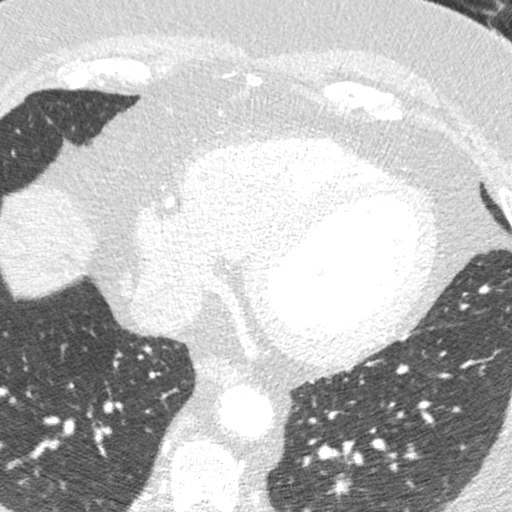
[im 252/378  lung]
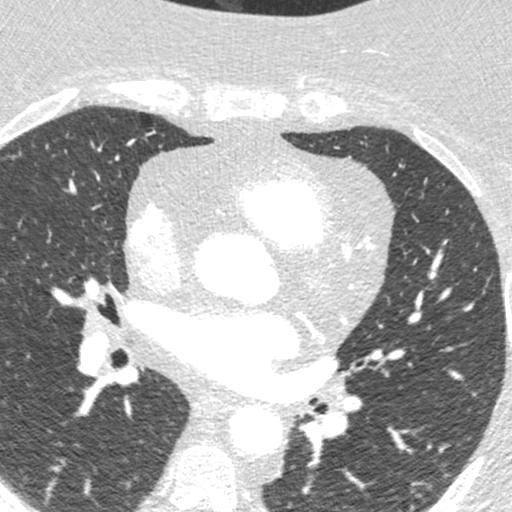

[Series 11: ts syst sharp 32 % · axial · 0.44mm/px · z∈[-299,-248]mm · 2 of 378 slices shown]
[im 126/378  lung]
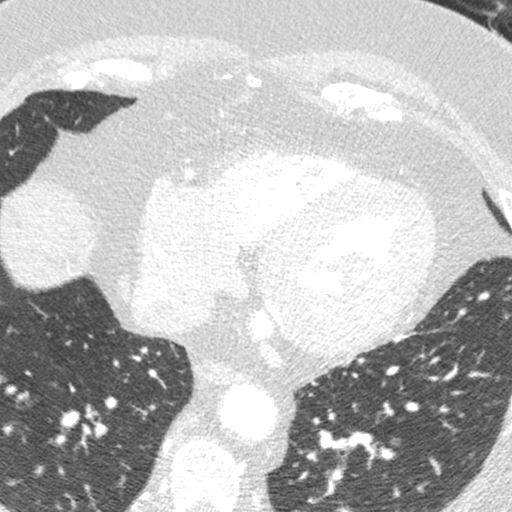
[im 252/378  lung]
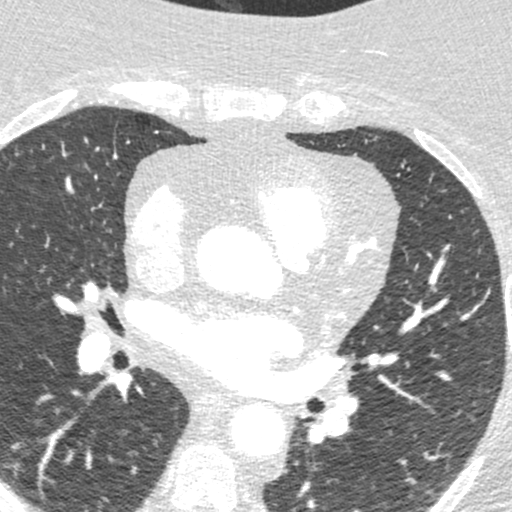

[8 of 20 positions shown; findings below may reference images not displayed]

FINDINGS: Vascular: Aortic atherosclerosis. No central pulmonary embolism, on
this non-dedicated study.

Mediastinum/Nodes: No imaged thoracic adenopathy.

Lungs/Pleura: No imaged pleural fluid. Probable scarring at the left
lung base. An area of nodularity including image 48/14 is similar
(given differences in technique) to the 06/04/2011 exam and can be
presumed benign.

Upper Abdomen: Normal imaged portions of the liver, spleen, stomach.

Musculoskeletal: No acute osseous abnormality.
IMPRESSION: 1.  No acute findings in the imaged extracardiac chest.
2.  Aortic Atherosclerosis (B075A-NST.T).
FINDINGS: A 120 kV prospective scan was triggered in the descending thoracic
aorta at 111 HU's. Axial non-contrast 3 mm slices were carried out
through the heart. The data set was analyzed on a dedicated work
station and scored using the Agatson method. Gantry rotation speed
was 250 msecs and collimation was .6 mm. No beta blockade and 0.8 mg
of sl NTG was given. The 3D data set was reconstructed in 5%
intervals of the 67-82 % of the R-R cycle. Diastolic phases were
analyzed on a dedicated work station using MPR, MIP and VRT modes.
The patient received 80 cc of contrast.

Aorta: Normal size. Mild diffuse atherosclerotic plaque and
calcifications. No dissection.

Aortic Valve:  Trileaflet.  No calcifications.

Coronary Arteries:  Normal coronary origin.  Right dominance.

RCA is a large dominant artery that gives rise to PDA and PLA. There
is moderate diffuse mixed plaque with focal stenoses 50-60% in the
mid and distal RCA, proximal PLA and mid PDA.

Left main is a large artery that gives rise to LAD, a ramus
intermedius and LCX arteries. Left main has no obvious plaque.

LAD is a large vessel that gives rise to two small diagonal
arteries. Proximal LAD has long diffuse moderate calcified plaque
with stenosis 50-69%. This is followed by a non-calcified plaque in
the mid LAD with suspicion for stenosis > 70%. Distal LAD has only
mild plaque.

D1,2 are small with mild plaque.

Ramus intermedius is a medium size artery with moderate calcified
plaque in the proximal portion with stenosis 50-69%.

LCX is a small caliber non-dominant artery that has mild diffuse
plaque.

Other findings:

Normal pulmonary vein drainage into the left atrium.

Normal let atrial appendage without a thrombus.

Normal size of the pulmonary artery.
IMPRESSION: 1. Coronary calcium score of 719. This was 99 percentile for age and
sex matched control.

2. Normal coronary origin with right dominance.

3. Moderate to severe CAD with moderate stenosis in the mid and
distal RCA, proximal PLA, mid PDA, proximal LAD and suspicion for
severe stenosis in the mid LAD. Additional analysis with CT FFR will
be submitted.

*** End of Addendum ***

## 2019-01-02 MED ORDER — NITROGLYCERIN 0.4 MG SL SUBL
SUBLINGUAL_TABLET | SUBLINGUAL | Status: AC
  Filled 2019-01-02: qty 2

## 2019-01-02 MED ORDER — NITROGLYCERIN 0.4 MG SL SUBL
0.8000 mg | SUBLINGUAL_TABLET | Freq: Once | SUBLINGUAL | Status: AC
  Administered 2019-01-02: 0.8 mg via SUBLINGUAL

## 2019-01-02 MED ORDER — PERFLUTREN LIPID MICROSPHERE
1.0000 mL | INTRAVENOUS | Status: AC | PRN
  Administered 2019-01-02: 2 mL via INTRAVENOUS
  Filled 2019-01-02: qty 10

## 2019-01-02 MED ORDER — IOPAMIDOL (ISOVUE-370) INJECTION 76%
80.0000 mL | Freq: Once | INTRAVENOUS | Status: AC | PRN
  Administered 2019-01-02: 80 mL via INTRAVENOUS

## 2019-01-02 NOTE — Progress Notes (Signed)
Physical Therapy Treatment Patient Details Name: Madison Medina MRN: [id number] DOB: [birth year] Today's Date: 01/02/2019    History of Present Illness Madison Medina is an 58 y.o. female  With PMH HTN, MI, HLD, COPD, asthma, hyperthyroidism (had radiation now hypo) presented to [facility] ED for right arm numbness and weakness. 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 New abnormal appearance of the left ICA. Breast biopsy 12/28/2018 with family reporting positive for cancer.    PT Comments    Patient progressing well towards PT goals. Pt with more verbalizations today, requires increased time to answer questions but able to state a few short sentences as well. Continues to demonstrate balance deficits with LOB x2 requiring Mod A to correct. Pt with poor awareness of deficits/safety. Difficulty locating rooms in hallway and navigating signage. Appropriate for CIR. Will follow.    Follow Up Recommendations  CIR     Equipment Recommendations  None recommended by PT    Recommendations for Other Services       Precautions / Restrictions Precautions Precautions: Fall Precaution Comments: place gait belt above breasts (Bil biopsies 12/28/2018 at under side and lower part of breasts) Restrictions Weight Bearing Restrictions: No    Mobility  Bed Mobility Overal bed mobility: Needs Assistance Bed Mobility: Supine to Sit;Sit to Supine     Supine to sit: Supervision;HOB elevated Sit to supine: HOB elevated;Supervision   General bed mobility comments: supervision for safety  Transfers Overall transfer level: Needs assistance Equipment used: None Transfers: Sit to/from Stand Sit to Stand: Min assist         General transfer comment: Stood from EOB with immediate LOB requiring Min A to steady. Stood from [employer], from toilet x1.   Ambulation/Gait Ambulation/Gait assistance: Min assist Gait Distance (Feet): 100  Feet Assistive device: None Gait Pattern/deviations: Step-through pattern;Decreased step length - right;Decreased step length - left;Decreased stride length;Drifts right/left Gait velocity: decreased   General Gait Details: pt with mild instability with 1 instance of LOB with turning requiring Mod A to correct. No bumping into objects noted today. 2/4 DOE. VSS throughout.   Stairs             Wheelchair Mobility    Modified Rankin (Stroke Patients Only) Modified Rankin (Stroke Patients Only) Pre-Morbid Rankin Score: Slight disability Modified Rankin: Moderate disability     Balance Overall balance assessment: Needs assistance Sitting-balance support: No upper extremity supported;Feet supported Sitting balance-Leahy Scale: Good     Standing balance support: During functional activity Standing balance-Leahy Scale: Fair Standing balance comment: CLose min guard due to episodes of imbalance.          Rhomberg - Eyes Opened: 46 Rhomberg - Eyes Closed: 30(increased sway)                Cognition Arousal/Alertness: Awake/alert Behavior During Therapy: WFL for tasks assessed/performed Overall Cognitive Status: Impaired/Different from baseline Area of Impairment: Memory;Following commands;Safety/judgement;Problem solving                     Memory: Decreased short-term memory;Decreased recall of precautions Following Commands: Follows one step commands consistently Safety/Judgement: Decreased awareness of safety;Decreased awareness of deficits   Problem Solving: Difficulty sequencing;Requires verbal cues General Comments: More verbalizations today with increased time to respond to questions. Able to state automatic phrases "i need to go to the bathroom." Stated birthday, name and location. Difficulty finding locations of rooms in hallway despite cues and trying  to read signage.       Exercises      General Comments General comments (skin integrity,  edema, etc.): Sister present during session.      Pertinent Vitals/Pain Pain Assessment: Faces Faces Pain Scale: No hurt    Home Living                      Prior Function            PT Goals (current goals can now be found in the care plan section) Progress towards PT goals: Progressing toward goals    Frequency    Min 4X/week      PT Plan Current plan remains appropriate    Co-evaluation              AM-PAC PT "6 Clicks" Mobility   Outcome Measure  Help needed turning from your back to your side while in a flat bed without using bedrails?: A Little Help needed moving from lying on your back to sitting on the side of a flat bed without using bedrails?: A Little Help needed moving to and from a bed to a chair (including a wheelchair)?: A Little Help needed standing up from a chair using your arms (e.g., wheelchair or bedside chair)?: A Little Help needed to walk in hospital room?: A Little Help needed climbing 3-5 steps with a railing? : A Little 6 Click Score: 18    End of Session Equipment Utilized During Treatment: Gait belt(positioned high above chest to avoid biopsy locations.) Activity Tolerance: Patient tolerated treatment well Patient left: in bed;with call bell/phone within reach;with bed alarm set;with family/visitor present Nurse Communication: Mobility status PT Visit Diagnosis: Other abnormalities of gait and mobility (R26.89);Other symptoms and signs involving the nervous system (Y77.412)     Time: 8786-7672 PT Time Calculation (min) (ACUTE ONLY): 21 min  Charges:  $Neuromuscular Re-education: 8-22 mins                     Wray Kearns, PT, DPT Acute Rehabilitation Services Pager [phone] Office [phone]       [place] 01/02/2019, 10:33 AM

## 2019-01-02 NOTE — Progress Notes (Signed)
Occupational Therapy Treatment Patient Details Name: Madison Medina MRN: [id number] DOB: [birth year] Today's Date: 01/02/2019    History of present illness Madison Medina is an 58 y.o. female  With PMH HTN, MI, HLD, COPD, asthma, hyperthyroidism (had radiation now hypo) presented to [facility] ED for right arm numbness and weakness. 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 New abnormal appearance of the left ICA. Breast biopsy 12/28/2018 with family reporting positive for cancer.   OT comments  Pt recently returning to room from testing and agreeable to OT intervention. Her sister present in the room as well. Pt verbalizing need for toileting. Supine >sit with supervision and min cuing for hand placement. Pt ambulating to bathroom with min hand held assistance and no LOB. Pt performed clothing management and hygiene with min guard for balance. Pt standing at sink with min guard and forced use of R UE to wash face with supervision and min cuing. Pt returning to sit on EOB. Pt able to utilize R UE to brush back hair and bring hand to mouth and practiced bring drink to mouth. OT recommends pt attempts to utilize R UE for self feeding of meal. Caregiver verbalized understanding as well. Pt returning to supine and call bell within reach and bed alarm activated.    Follow Up Recommendations  CIR;Supervision/Assistance - 24 hour    Equipment Recommendations  Other (comment)(defer to next venue of care)       Precautions / Restrictions Precautions Precautions: Fall Restrictions Weight Bearing Restrictions: No       Mobility Bed Mobility Overal bed mobility: Needs Assistance Bed Mobility: Supine to Sit;Sit to Supine     Supine to sit: Supervision;HOB elevated Sit to supine: HOB elevated;Supervision   General bed mobility comments: supervision for safety  Transfers Overall transfer level: Needs assistance Equipment used:  None Transfers: Sit to/from Stand Sit to Stand: Min assist              Balance Overall balance assessment: Needs assistance Sitting-balance support: No upper extremity supported;Feet supported Sitting balance-Leahy Scale: Good     Standing balance support: During functional activity Standing balance-Leahy Scale: Fair Standing balance comment: CLose min guard due to episodes of imbalance.             ADL either performed or assessed with clinical judgement   ADL Overall ADL's : Needs assistance/impaired     Grooming: Minimal assistance;Standing;Wash/dry hands;Wash/dry face Grooming Details (indicate cue type and reason): with use of dominant RUE                 Toilet Transfer: Minimal assistance   Toileting- Clothing Manipulation and Hygiene: Minimal assistance                         Cognition Arousal/Alertness: Awake/alert Behavior During Therapy: WFL for tasks assessed/performed Overall Cognitive Status: Impaired/Different from baseline Area of Impairment: Memory;Following commands;Safety/judgement;Problem solving                     Memory: Decreased short-term memory;Decreased recall of precautions Following Commands: Follows one step commands consistently Safety/Judgement: Decreased awareness of safety;Decreased awareness of deficits   Problem Solving: Difficulty sequencing;Requires verbal cues                     Pertinent Vitals/ Pain       Pain Assessment: No/denies pain Faces Pain Scale: No hurt  Frequency  Min 3X/week        Progress Toward Goals  OT Goals(current goals can now be found in the care plan section)  Progress towards OT goals: Progressing toward goals  Acute Rehab OT Goals Patient Stated Goal: to get better OT Goal Formulation: With patient/family Time For Goal Achievement: 01/16/19 Potential to Achieve Goals: Good  Plan Discharge plan remains appropriate       AM-PAC OT "6  Clicks" Daily Activity     Outcome Measure   Help from another person eating meals?: A Little Help from another person taking care of personal grooming?: A Lot Help from another person toileting, which includes using toliet, bedpan, or urinal?: A Little Help from another person bathing (including washing, rinsing, drying)?: A Lot Help from another person to put on and taking off regular upper body clothing?: A Lot Help from another person to put on and taking off regular lower body clothing?: A Lot 6 Click Score: 14    End of Session    OT Visit Diagnosis: Unsteadiness on feet (R26.81);Other abnormalities of gait and mobility (R26.89);Muscle weakness (generalized) (M62.81);Other symptoms and signs involving cognitive function;Cognitive communication deficit (R41.841);Dizziness and giddiness (R42) Symptoms and signs involving cognitive functions: Cerebral infarction   Activity Tolerance Patient tolerated treatment well   Patient Left in bed;with call bell/phone within reach;with bed alarm set;with family/visitor present   Nurse Communication Mobility status        Time: 6151-8343 OT Time Calculation (min): 18 min  Charges: OT General Charges $OT Visit: 1 Visit OT Treatments $Self Care/Home Management : 8-22 mins   Lesa Vandall P, MS, OTR/L 01/02/2019, 3:30 PM

## 2019-01-02 NOTE — Care Management Important Message (Signed)
Important Message  Patient Details  Name: Madison Medina MRN: [id number] Date of Birth: Feb 19, 1961   Medicare Important Message Given:  Yes    Shanieka Blea [address] 01/02/2019, 3:57 PM

## 2019-01-02 NOTE — Progress Notes (Signed)
  Speech Language Pathology Treatment: Cognitive-Linquistic  Patient Details Name: Madison Medina MRN: [id number] DOB: 1961/07/13 Today's Date: 01/02/2019 Time: 1937-9024 SLP Time Calculation (min) (ACUTE ONLY): 35 min  Assessment / Plan / Recommendation Clinical Impression  Patient seen today for expressive/receptive therapy. She was given confrontational naming task of common objects in the room with 5/10 accuracy with moderate verbal cues (after 10 minute delay patient able to name 8/10 of the same objects with no verbal cues).  Patient was able to perform 100% of 1 and 2 step directions with those objects with no cues. She was able to give the function of 3/10 objects.  She was oriented to self, place, and year but not to month, date or day. Education provided to patient and sister about receptive and expressive aphasia and techniques and compensatory strategies that improve accuracy and reduce frustration. Patient is making progress toward goals. Continue speech therapy to address cogn/language deficites.    HPI HPI: 58 y.o. female with medical history significant for HTN, obesity, HLD, hypothyroidism, COPD and 80 pack-year smoking hx (current smoker) who was transferred to [facility] today from [place] for acute CVA.       SLP Plan  Continue with current plan of care       Recommendations  Diet recommendations: Regular;Thin liquid Liquids provided via: Cup;Straw Medication Administration: Whole meds with liquid                Oral Care Recommendations: Oral care BID Follow up Recommendations: Inpatient Rehab SLP Visit Diagnosis: Aphasia (R47.01);Cognitive communication deficit (O97.353) Plan: Continue with current plan of care       [place], MA, CCC-SLP 01/02/2019 1:37 PM

## 2019-01-02 NOTE — Progress Notes (Signed)
Inpatient Rehabilitation Admissions Coordinator  I met with pt and her sister at bedside. Noted for surgery on Wednesday. Likely will not need an inpt rehab admit postoperatively. I will follow.  Danne Baxter, RN, MSN Rehab Admissions Coordinator [phone] 01/02/2019 12:33 PM

## 2019-01-02 NOTE — Progress Notes (Signed)
PROGRESS NOTE  Madison Medina:[id number] DOB: 10-31-1961 DOA: 12/30/2018 PCP: Welford Roche, NP  HPI/Recap of past 24 hours: Madison Medina is a 58 y.o. female with medical history significant for HTN, obesity, HLD, hypothyroidism, COPD and 59 pack-year smoking hx (current smoker) who was Medina to [facility] today from [facility] for acute CVA. Madison Medina had a breast biopsy at [facility] and around that time (~0800) Medina had Madison acute onset of RUE tingling and numbness and shortly thereafter developed RLE weakness. Madison next morning around 0900 family noticed that Madison Medina was mumbling and had garbled Medina. Madison Medina was unaware Medina was speaking differently than usual and Medina was able to understand what people were saying to Madison Medina. However later on in Madison day Medina became confused and was aware of it and felt Medina needed to go to [place] ED.  At [facility], Medina was afebrile, HR 68, RR 18, 97% on RA. Medina was hypertensive at 189/88. Dysarthria and confusion had resolved but R arm weakness remained. Madison Medina was able to answer "yes" or "no" to questions but otherwise unable to speak. Labs were remarkabel for hyponatremia at 132, slight increase in baseline creatinine at 1.1 (baseline appears to be ~0.7 - 0.8). Labs o/w were unremarkable. Head CT was negative but brain MRI revealed patchy acute infarcts in Madison L cerebral hemisphere with involvement of Madison superficial and deep watershed zones, new abnormal appearance of Madison L ICA. CTA head/neck showed near occlusion of L proximal ICA with severe thread-like stenosis with mixed plaque. R prox ICA had severe 90% stenosis with mixed plaque. tPA was not given due to Madison Medina. Medina was given ASA 325 mg and teleneurology consult was obtained. Decision was made to transfer Madison Medina to [facility].  12/31/18: Right upper extremity weakness persists with 3 out of 5 strength on exam.  Madison Medina OT assessed and recommended CIR.  CIR consulted for inpatient rehab admission  eval. 01/01/19: A little more talkative today. Per husband Madison Medina is improving.  01/02/2019: Madison Medina with Madison Medina sister at bedside.  No acute events overnight.  Plan for left carotid endarterectomy on Wednesday, 01/04/2019.  Ongoing permissive hypertension.   Assessment/Plan: Principal Problem:   CVA (cerebral vascular accident) ([place]) Active Problems:   HTN (hypertension)   Anxiety   Hypothyroid   Familial combined hyperlipidemia   Tobacco use   COPD (chronic obstructive pulmonary disease) (HCC)   AKI (acute kidney injury) ([place])   Hyponatremia  Acute ischemic left cerebral CVA possibly secondary to severe symptomatic left carotid stenosis MRI done at [facility] revealed: Patchy acute infarcts left cerebral hemisphere with involvement of superficial and deep watershed zones. New abnormal appearance of [place] left ICA. 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 Right proximal ICA severe 90% stenosis with mixed plaque. Anterior left vertebral artery origin moderate 50% stenosis. Patent diminutive right vertebral artery.  Diminutive left ICA downstream to bifurcation, stenosis likely due to chronic slow flow. Patent anterior and posterior intracranial circulation.  No large vessel occlusion, aneurysm or significant stenosis is identified. Neurology followed and signed off on 01/01/2019 Vascular surgery following, plan for left carotid artery enterectomy on 01/04/2019 On full dose aspirin and high-dose Lipitor, continue LDL 183 with a goal LDL of less than 70 A1c 5.7 with goal A1c less than 7.0 Continue physical therapy and Medina therapy Recommendations for CIR  Symptomatic left carotid stenosis Possibly reason  for Madison Medina CVA Vascular surgery planning on left carotid endarterectomy for secondary stroke prevention on Wednesday, January 04, 2019  Expressive aphasia post CVA Medina therapy following recommended  CIR  Severe bilateral carotid artery stenosis Management as stated above Vascular surgery following, Dr Trula Slade  Uncontrolled hyperlipidemia LDL 183 with a goal LDL of less than 70 Continue Lipitor 80 mg daily  Current tobacco user Tobacco cessation counseling at bedside  Morbid obesity BMI 38 Recommend weight loss outpatient Might require short-term rehab prior to discharge home  Chronic anxiety/depression Continue Celexa  Hypothyroidism Continue levothyroxine  Hypertension Hold off antihypertensive medication for permissive hypertension  GERD Continue omeprazole  DVT prophylaxis: sq lovenox daily Code Status:  Full  Family Communication:  Sister at bedside.  All questions answered to Madison Medina satisfaction..    Disposition Plan:  Possibly DC to home versus CIR post surgery or when vascular surgery signs off.  Consults called: neurology signed off on 01/01/2019, vascular surgery, cardiology    Objective: Vitals:   01/02/19 0113 01/02/19 0417 01/02/19 0848 01/02/19 1152  BP: 132/60 (!) 152/72 (!) 137/46 (!) 156/62  Pulse: (!) 52 (!) 52 (!) 51 (!) 55  Resp: 18 18 18 18   Temp: (!) 97.5 F (36.4 C) 97.6 F (36.4 C) 97.8 F (36.6 C) 98 F (36.7 C)  TempSrc: Axillary Axillary Oral Oral  SpO2: 95% 100% 96% 98%  Weight:      Height:        Intake/Output Summary (Last 24 hours) at 01/02/2019 1534 Last data filed at 01/02/2019 0113 Gross per 24 hour  Intake 1491.63 ml  Output 250 ml  Net 1241.63 ml   Filed Weights   12/31/18 1130  Weight: 121.7 kg    Exam:  . General: 58 y.o. year-old female obese in no acute distress.  Alert with expressive aphasia.   . Cardiovascular: Regular rate and rhythm with no rubs or gallops.  No JVD or thyromegaly noted. Marland Kitchen Respiratory: Clear to auscultation with no wheezes or rales.  Poor inspiratory effort.\ . Abdomen: Soft nontender nondistended with normal bowel sounds x4 quadrants. . Musculoskeletal: No lower extremity edema. 2/4  pulses in all 4 extremities.  Right upper extremity 3 out of 5 strength. . Psychiatry: Mood is appropriate for condition and setting   Data Reviewed: CBC: Recent Labs  Lab 12/30/18 1539 12/31/18 0529 01/01/19 0708 01/02/19 0535  WBC 10.2 10.9* 8.8 8.7  NEUTROABS 7.5  --   --   --   HGB 13.2 13.2 12.0 12.0  HCT 41.0 39.5 37.2 36.5  MCV 92.3 93.4 92.8 94.8  PLT 249 286 234 916   Basic Metabolic Panel: Recent Labs  Lab 12/30/18 1539 12/31/18 0529 01/01/19 0708 01/02/19 0535  NA 132* 133* 134* 135  K 4.3 4.1 3.8 4.3  CL 98 98 101 104  CO2 24 24 22 22   GLUCOSE 99 90 91 96  BUN 19 21* 14 12  CREATININE 1.08* 0.99 0.78 0.83  CALCIUM 8.7* 8.6* 7.9* 8.0*   GFR: Estimated Creatinine Clearance: 95.1 mL/min (by C-G formula based on SCr of 0.83 mg/dL). Liver Function Tests: Recent Labs  Lab 12/30/18 1539  AST 24  ALT 21  ALKPHOS 54  BILITOT 0.6  PROT 7.1  ALBUMIN 3.4*   No results for input(s): LIPASE, AMYLASE in [place] last 168 hours. No results for input(s): AMMONIA in [place] last 168 hours. Coagulation Profile: Recent Labs  Lab 12/30/18 1539  INR 1.02   Cardiac Enzymes: No results for input(s):  CKTOTAL, CKMB, CKMBINDEX, TROPONINI in [place] last 168 hours. BNP (last 3 results) No results for input(s): PROBNP in [place] last 8760 hours. HbA1C: Recent Labs    12/31/18 0529  HGBA1C 5.7*   CBG: Recent Labs  Lab 12/30/18 1155  GLUCAP 107*   Lipid Profile: Recent Labs    12/31/18 0529  CHOL 245*  HDL 39*  LDLCALC 183*  TRIG 117  CHOLHDL 6.3   Thyroid Function Tests: No results for input(s): TSH, T4TOTAL, FREET4, T3FREE, THYROIDAB in [place] last 72 hours. Anemia Panel: No results for input(s): VITAMINB12, FOLATE, FERRITIN, TIBC, IRON, RETICCTPCT in [place] last 72 hours. Urine analysis:    Component Value Date/Time   COLORURINE YELLOW 11/14/2009 [place] 11/14/2009 0955   LABSPEC 1.006 11/14/2009 0955   PHURINE 6.0 11/14/2009 0955   GLUCOSEU  NEGATIVE 11/14/2009 0955   HGBUR NEGATIVE 11/14/2009 0955   BILIRUBINUR NEGATIVE 11/14/2009 0955   KETONESUR NEGATIVE 11/14/2009 0955   PROTEINUR NEGATIVE 11/14/2009 0955   UROBILINOGEN 0.2 11/14/2009 0955   NITRITE NEGATIVE 11/14/2009 0955   LEUKOCYTESUR SMALL (A) 11/14/2009 0955   Sepsis Labs: @LABRCNTIP (procalcitonin:4,lacticidven:4)  )No results found for this or any previous visit (from [place] past 240 hour(s)).    Studies: Ct Coronary Morph W/cta Cor W/score W/ca W/cm &/or Wo/cm  Result Date: 01/02/2019 EXAM: OVER-READ INTERPRETATION  CT CHEST [place] following report is an over-read performed by radiologist Dr. Abigail Miyamoto of [facility], [place] on 01/02/2019. This over-read does not include interpretation of cardiac or coronary anatomy or pathology. Madison coronary CTA interpretation by [place] cardiologist is attached. COMPARISON:  Chest radiograph 12/29/2018. Complete chest CT of 06/04/2011. FINDINGS: Vascular: Aortic atherosclerosis. No central pulmonary embolism, on this non-dedicated study. Mediastinum/Nodes: No imaged thoracic adenopathy. Lungs/Pleura: No imaged pleural fluid. Probable scarring at Madison left lung base. An area of nodularity including image 48/14 is similar (given differences in technique) to Madison 06/04/2011 exam and can be presumed benign. Upper Abdomen: Normal imaged portions of Madison liver, spleen, stomach. Musculoskeletal: No acute osseous abnormality. IMPRESSION: 1.  No acute findings in [place] imaged extracardiac chest. 2.  Aortic Atherosclerosis (ICD10-I70.0). Electronically Signed   By: Abigail Miyamoto M.D.   On: 01/02/2019 14:50    Scheduled Meds: . aspirin EC  325 mg Oral Daily  . atorvastatin  80 mg Oral Daily  . celecoxib  200 mg Oral BID  . citalopram  20 mg Oral Daily  . enoxaparin (LOVENOX) injection  40 mg Subcutaneous Q24H  . famotidine  40 mg Oral QHS  . levothyroxine  25 mcg Oral Q0600  . metoprolol tartrate  25 mg Oral BID  . montelukast  10 mg Oral Daily    . nitroGLYCERIN      . pantoprazole  40 mg Oral Daily    Continuous Infusions: . sodium chloride 100 mL/hr at 01/02/19 0523     LOS: 3 days     Kayleen Memos, MD [facility] Pager [phone]  If 7PM-7AM, please contact night-coverage [email or web address] Password [facility] 01/02/2019, 3:34 PM

## 2019-01-02 NOTE — Progress Notes (Signed)
  Echocardiogram 2D Echocardiogram has been performed.  Bobbye Charleston 01/02/2019, 11:46 AM

## 2019-01-03 ENCOUNTER — Encounter (HOSPITAL_COMMUNITY)
Admission: AD | Disposition: A | Discharge status: Home / Self Care | Payer: Self-pay | Source: Other Acute Inpatient Hospital | Attending: Internal Medicine

## 2019-01-03 ENCOUNTER — Encounter (HOSPITAL_COMMUNITY): Payer: Self-pay | Admitting: Interventional Cardiology

## 2019-01-03 DIAGNOSIS — Z720 Tobacco use: Secondary | ICD-10-CM

## 2019-01-03 DIAGNOSIS — I2510 Atherosclerotic heart disease of native coronary artery without angina pectoris: Secondary | ICD-10-CM

## 2019-01-03 DIAGNOSIS — E871 Hypo-osmolality and hyponatremia: Secondary | ICD-10-CM

## 2019-01-03 DIAGNOSIS — I10 Essential (primary) hypertension: Secondary | ICD-10-CM

## 2019-01-03 DIAGNOSIS — E039 Hypothyroidism, unspecified: Secondary | ICD-10-CM

## 2019-01-03 DIAGNOSIS — J449 Chronic obstructive pulmonary disease, unspecified: Secondary | ICD-10-CM

## 2019-01-03 DIAGNOSIS — N179 Acute kidney failure, unspecified: Secondary | ICD-10-CM

## 2019-01-03 DIAGNOSIS — I25119 Atherosclerotic heart disease of native coronary artery with unspecified angina pectoris: Secondary | ICD-10-CM

## 2019-01-03 HISTORY — PX: LEFT HEART CATH AND CORONARY ANGIOGRAPHY: CATH118249

## 2019-01-03 HISTORY — DX: Atherosclerotic heart disease of native coronary artery without angina pectoris: I25.10

## 2019-01-03 LAB — CBC
HCT: 35.3 % — ABNORMAL LOW (ref 36.0–46.0)
Hemoglobin: 11 g/dL — ABNORMAL LOW (ref 12.0–15.0)
MCH: 29.9 pg (ref 26.0–34.0)
MCHC: 31.2 g/dL (ref 30.0–36.0)
MCV: 95.9 fL (ref 80.0–100.0)
Platelets: 204 10*3/uL (ref 150–400)
RBC: 3.68 MIL/uL — ABNORMAL LOW (ref 3.87–5.11)
RDW: 14.7 % (ref 11.5–15.5)
WBC: 9 10*3/uL (ref 4.0–10.5)
nRBC: 0 % (ref 0.0–0.2)

## 2019-01-03 LAB — BASIC METABOLIC PANEL
Anion gap: 6 (ref 5–15)
BUN: 13 mg/dL (ref 6–20)
CO2: 24 mmol/L (ref 22–32)
Calcium: 7.8 mg/dL — ABNORMAL LOW (ref 8.9–10.3)
Chloride: 105 mmol/L (ref 98–111)
Creatinine, Ser: 0.82 mg/dL (ref 0.44–1.00)
GFR calc Af Amer: >60 mL/min (ref >60)
GFR calc non Af Amer: >60 mL/min (ref >60)
Glucose, Bld: 92 mg/dL (ref 70–99)
POTASSIUM: 4.2 mmol/L (ref 3.5–5.1)
Sodium: 135 mmol/L (ref 135–145)

## 2019-01-03 SURGERY — LEFT HEART CATH AND CORONARY ANGIOGRAPHY
Anesthesia: LOCAL

## 2019-01-03 MED ORDER — VERAPAMIL HCL 2.5 MG/ML IV SOLN
INTRAVENOUS | Status: DC | PRN
  Administered 2019-01-03: 10 mL via INTRA_ARTERIAL

## 2019-01-03 MED ORDER — SODIUM CHLORIDE 0.9 % WEIGHT BASED INFUSION: 1.0000 mL/kg/h | INTRAVENOUS | Status: DC

## 2019-01-03 MED ORDER — CEFAZOLIN SODIUM-DEXTROSE 1-4 GM/50ML-% IV SOLN: 1.0000 g | INTRAVENOUS | Status: DC

## 2019-01-03 MED ORDER — MIDAZOLAM HCL 2 MG/2ML IJ SOLN
INTRAMUSCULAR | Status: AC
  Filled 2019-01-03: qty 2

## 2019-01-03 MED ORDER — ACETAMINOPHEN 325 MG PO TABS: 650.0000 mg | ORAL_TABLET | ORAL | Status: DC | PRN

## 2019-01-03 MED ORDER — FENTANYL CITRATE (PF) 100 MCG/2ML IJ SOLN
INTRAMUSCULAR | Status: DC | PRN
  Administered 2019-01-03: 25 ug via INTRAVENOUS

## 2019-01-03 MED ORDER — SODIUM CHLORIDE 0.9% FLUSH
3.0000 mL | Freq: Two times a day (BID) | INTRAVENOUS | Status: DC
  Administered 2019-01-03 – 2019-01-05 (×3): 3 mL via INTRAVENOUS

## 2019-01-03 MED ORDER — SODIUM CHLORIDE 0.9 % WEIGHT BASED INFUSION: 3.0000 mL/kg/h | INTRAVENOUS | Status: DC

## 2019-01-03 MED ORDER — SODIUM CHLORIDE 0.9% FLUSH: 3.0000 mL | INTRAVENOUS | Status: DC | PRN

## 2019-01-03 MED ORDER — SODIUM CHLORIDE 0.9 % IV SOLN
INTRAVENOUS | Status: AC
  Administered 2019-01-03: 15:00:00 via INTRAVENOUS

## 2019-01-03 MED ORDER — ONDANSETRON HCL 4 MG/2ML IJ SOLN: 4.0000 mg | Freq: Four times a day (QID) | INTRAMUSCULAR | Status: DC | PRN

## 2019-01-03 MED ORDER — HEPARIN SODIUM (PORCINE) 1000 UNIT/ML IJ SOLN
INTRAMUSCULAR | Status: AC
  Filled 2019-01-03: qty 1

## 2019-01-03 MED ORDER — HEPARIN (PORCINE) IN NACL 1000-0.9 UT/500ML-% IV SOLN
INTRAVENOUS | Status: DC | PRN
  Administered 2019-01-03 (×2): 500 mL

## 2019-01-03 MED ORDER — MUPIROCIN 2 % EX OINT
1.0000 "application " | TOPICAL_OINTMENT | Freq: Two times a day (BID) | CUTANEOUS | Status: DC
  Administered 2019-01-03 – 2019-01-05 (×3): 1 via NASAL
  Filled 2019-01-03 (×3): qty 22

## 2019-01-03 MED ORDER — HEPARIN SODIUM (PORCINE) 1000 UNIT/ML IJ SOLN
INTRAMUSCULAR | Status: DC | PRN
  Administered 2019-01-03: 6000 [IU] via INTRAVENOUS

## 2019-01-03 MED ORDER — HEPARIN (PORCINE) IN NACL 1000-0.9 UT/500ML-% IV SOLN
INTRAVENOUS | Status: AC
  Filled 2019-01-03: qty 1000

## 2019-01-03 MED ORDER — CEFAZOLIN SODIUM-DEXTROSE 2-4 GM/100ML-% IV SOLN
2.0000 g | INTRAVENOUS | Status: AC
  Administered 2019-01-04: 2 g via INTRAVENOUS
  Filled 2019-01-03: qty 100

## 2019-01-03 MED ORDER — MIDAZOLAM HCL 2 MG/2ML IJ SOLN
INTRAMUSCULAR | Status: DC | PRN
  Administered 2019-01-03: 0.5 mg via INTRAVENOUS

## 2019-01-03 MED ORDER — LIDOCAINE HCL (PF) 1 % IJ SOLN
INTRAMUSCULAR | Status: AC
  Filled 2019-01-03: qty 30

## 2019-01-03 MED ORDER — FENTANYL CITRATE (PF) 100 MCG/2ML IJ SOLN
INTRAMUSCULAR | Status: AC
  Filled 2019-01-03: qty 2

## 2019-01-03 MED ORDER — IOHEXOL 350 MG/ML SOLN
INTRAVENOUS | Status: DC | PRN
  Administered 2019-01-03: 60 mL via INTRA_ARTERIAL

## 2019-01-03 MED ORDER — ENOXAPARIN SODIUM 40 MG/0.4ML ~~LOC~~ SOLN
40.0000 mg | SUBCUTANEOUS | Status: DC
  Administered 2019-01-05: 40 mg via SUBCUTANEOUS
  Filled 2019-01-03: qty 0.4

## 2019-01-03 MED ORDER — SODIUM CHLORIDE 0.9 % IV SOLN: 250.0000 mL | INTRAVENOUS | Status: DC | PRN

## 2019-01-03 MED ORDER — VERAPAMIL HCL 2.5 MG/ML IV SOLN
INTRAVENOUS | Status: AC
  Filled 2019-01-03: qty 2

## 2019-01-03 MED ORDER — LIDOCAINE HCL (PF) 1 % IJ SOLN
INTRAMUSCULAR | Status: DC | PRN
  Administered 2019-01-03: 2 mL via INTRADERMAL

## 2019-01-03 SURGICAL SUPPLY — 10 items
CATH 5FR JL3.5 JR4 ANG PIG MP (CATHETERS) ×2 IMPLANT
DEVICE RAD COMP TR BAND LRG (VASCULAR PRODUCTS) ×2 IMPLANT
GLIDESHEATH SLEND A-KIT 6F 22G (SHEATH) ×2 IMPLANT
GUIDEWIRE INQWIRE 1.5J.035X260 (WIRE) ×1 IMPLANT
INQWIRE 1.5J .035X260CM (WIRE) ×2
KIT HEART LEFT (KITS) ×2 IMPLANT
PACK CARDIAC CATHETERIZATION (CUSTOM PROCEDURE TRAY) ×2 IMPLANT
SHEATH PROBE COVER 6X72 (BAG) ×2 IMPLANT
TRANSDUCER W/STOPCOCK (MISCELLANEOUS) ×2 IMPLANT
TUBING CIL FLEX 10 FLL-RA (TUBING) ×2 IMPLANT

## 2019-01-03 NOTE — Progress Notes (Signed)
PROGRESS NOTE    Madison Medina  GNF:[id number] DOB: 10-02-1961 DOA: 12/30/2018 PCP: Welford Roche, NP  Brief Narrative:  Madison Kettlewell Brooksis a 58 y.o.femalewith medical history significant forHTN, obesity, HLD, hypothyroidism, COPD and 80 pack-year smoking hx (current smoker)whowas transferred to [facility] today from [place] for acute CVA. The morning of Jan 15 she had a breast biopsy at [facility] and around that time (~0800) she had the acute onset of RUE tingling and numbness and shortly thereafter developed RLE weakness. The next morning around 0900 family noticed that pt was mumbling and had garbled speech. Patient herself was unaware she was speaking differently than usual and she was able to understand what people were saying to her. However later on in the day she became confused and was aware of it and felt she needed to go to the ED.  At [facility], she was afebrile, HR 68, RR 18, 97% on RA. She was hypertensive at 189/88. Dysarthria and confusion had resolved but R arm weakness remained. Pt was able to answer "yes" or "no" to questions but otherwise unable to speak. Labs were remarkabel for hyponatremia at 132, slight increase in baseline creatinine at 1.1 (baseline appears to be ~0.7 - 0.8). Labs o/w were unremarkable. Head CT was negative but brain MRI revealed patchy acute infarcts in the L cerebral hemisphere with involvement of the superficial and deep watershed zones, new abnormal appearance of the L ICA. CTA head/neck showed near occlusion of L proximal ICA with severe thread-like stenosis with mixed plaque. R prox ICA had severe 90% stenosis with mixed plaque. tPA was not given due to pt being out of the window. She was given ASA 325 mg and teleneurology consult was obtained. Decision was made to transfer pt to [facility].  12/31/18: Right upper extremity weakness persists with 3 out of 5 strength on exam.  PT OT assessed and recommended CIR.  CIR consulted for inpatient rehab admission eval. 01/01/19: A  little more talkative today. Per husband her speech is improving.  01/02/2019: Patient seen and examined with her sister at bedside.  No acute events overnight.  Plan is for left carotid endarterectomy on Wednesday, 01/04/2019.  Ongoing permissive hypertension but Cardiology involved and getting Coronary CTA  01/03/2019: Cornoary CTA reviewed by Cardiology and revealed borderline significant mid LAD Stenosis so she is undergoing Cardiac Catheterization today.  Assessment & Plan:   Principal Problem:   CVA (cerebral vascular accident) ([place]) Active Problems:   HTN (hypertension)   Anxiety   Hypothyroid   Familial combined hyperlipidemia   Tobacco use   COPD (chronic obstructive pulmonary disease) (HCC)   AKI (acute kidney injury) ([place])   Hyponatremia   CAD in native artery  Acute ischemic left cerebral CVA possibly secondary to severe symptomatic left carotid stenosis -MRI done at [facility] revealed: Patchy acute infarcts left cerebral hemisphere with involvement of superficial and deep watershed zones. Newabnormal appearance of the left ICA. -CTA head and neck showed Left proximal ICA near-occlusionwithseverethread-like stenosis with mixed plaque.Right proximal ICA severe 90% stenosis with mixed plaque. Anterior left vertebral artery origin moderate 50% stenosis. Patent diminutive right vertebral artery. Diminutive left ICA downstream to bifurcation,stenosis likely due to chronic slow flow. Patent anterior and posterior intracranial circulation. No large vessel occlusion, aneurysm or significant stenosis is identified. -Neurology followed and signed off on 01/01/2019 -Vascular surgery following, plan for left carotid artery enterectomy on 01/04/2019 but want pre-operative Clearance -On full Dose Aspirin and high-dose Lipitor, continue -LDL 183 with a goal  LDL of less than 70 A1c 5.7 with goal A1c less than 7.0 -Continue physical therapy and speech therapy -Recommendations for  CIR  Symptomatic left carotid stenosis -Possibly reason for her CVA -Vascular surgery planning on left carotid endarterectomy for secondary stroke prevention on Wednesday, January 04, 2019 but want Pre-operative Clearance first   Expressive aphasia post CVA -Speech therapy following recommended CIR  Severe bilateral carotid artery stenosis with Symptomatic Left Carotid Stenosis -Management as stated above -Vascular surgery following, Dr Trula Slade -The patient has symptomatic left carotid stenosis is recommended for the patient to undergo a left carotid endarterectomy for stroke prevention but prior to this she needs cardiology evaluation for preoperative clearance  Uncontrolled Hyperlipidemia -Lipid Panel showed total cholesterol/HDL ratio of 6.3, cholesterol level of 245, HDL 39, LDL 183, triglycerides 117, VLDL 23 -Goal LDL of less than 70 -C/w Atorvastatin 80 mg daily  Tobacco Abuse -Smoking Cessation Counseling given  Anxiety and Depression -Chronic -C/w Citalopram 20 mg po Daily  Hypothyroidism -Continue Levothyroxine 25 mcg po Daily   Hypertension -Hold off antihypertensive medication for permissive hypertension but will continue Metoprolol Tartrate 25 mg po BID -BP this AM was 156/67  Pre-operative Clearance/?CAD -Cardiolology consulted and following -Recommended Coronary CTA which showed Coronary calcium score of 719. This was 102 percentile for age and sex matched control. Normal coronary origin with right dominance. Moderate to severe CAD with moderate stenosis in the mid and distal RCA, proximal PLA, mid PDA, proximal LAD and suspicion for severe stenosis in the mid LAD. Additional analysis with CT FFR will be submitted and was 0.8 -Cardiology recommending Cardiac Catheterization to define coronary anatomy prior to there Carotid Endarterectomy Surgery  GERD -Continue Omeprazole substitution with Pantoprazole 40 mg po Daily and Famotidine 40 mg po qHS  Morbid  Obesity -Estimated body mass index is 46.05 kg/m as calculated from the following:   Height as of this encounter: 5\' 4"  (1.626 m).   Weight as of this encounter: 121.7 kg. -Weight Loss and Dietary Counseling given  DVT prophylaxis: Enoxaparin 40 mg sq q24h Code Status: FULL CODE Family Communication: No Family present at bedside  Disposition Plan: Pending further workup and evaluation and will need Cardiac Clearance prior to Carotid Endartectomy   Consultants:   Neurology  Vascular Surgery  Cardiology   Procedures: ECHOCARDIOGRAM ------------------------------------------------------------------- Study Conclusions  - Left ventricle: The cavity size was normal. Wall thickness was   increased in a pattern of mild LVH. The estimated ejection   fraction was 55%. Although no diagnostic regional wall motion   abnormality was identified, this possibility cannot be completely   excluded on the basis of this study. Features are consistent with   a pseudonormal left ventricular filling pattern, with concomitant   abnormal relaxation and increased filling pressure (grade 2   diastolic dysfunction). - Aortic valve: There was no stenosis. - Mitral valve: There was no significant regurgitation. - Right ventricle: Poorly visualized. The cavity size was normal.   Systolic function was mildly reduced. - Tricuspid valve: Poorly visualized. - Pulmonary arteries: No complete TR doppler jet so unable to   estimate PA systolic pressure. - Systemic veins: IVC measured 2.0 cm with < 50% respirophasic   variation, suggesting RA pressure 8 mmHg.  Impressions:  - Technically difficult study with poor acoustic windows. Normal LV   size with mild LV hypertrophy. EF 55%. Moderate diastolic   dysfunction. Normal RV size with mildly decreased systolic   function. No significant valvular abnormalities.  Antimicrobials:  Anti-infectives (  From admission, onward)   Start     Dose/Rate Route  Frequency Ordered Stop   01/04/19 0600  [MAR Hold]  ceFAZolin (ANCEF) IVPB 2g/100 mL premix     (MAR Hold since Tue 01/03/2019 at 1149. Reason: Transfer to a Procedural area.)  Note to Pharmacy:  Send with pt to OR   2 g 200 mL/hr over 30 Minutes Intravenous To Short Stay 01/03/19 0742 01/05/19 0600     Subjective: Seen and Examined at bedside was sitting in the chair and had no complaints.  Denies any chest pain, nausea or vomiting.  No nausea or vomiting and was ready for cardiac catheterization.  States that this is her first stroke ever.  No other concerns or complaints at this time  Objective: Vitals:   01/02/19 2334 01/03/19 0319 01/03/19 0746 01/03/19 1209  BP: 138/76 137/61 (!) 191/69   Pulse: (!) 59 (!) 57 (!) 49   Resp: 18 19 16    Temp: 98.3 F (36.8 C) 98.1 F (36.7 C) (!) 97.5 F (36.4 C)   TempSrc: Oral Oral Oral   SpO2: 99% 99% 97% 99%  Weight:      Height:        Intake/Output Summary (Last 24 hours) at 01/03/2019 1247 Last data filed at 01/03/2019 0600 Gross per 24 hour  Intake 3344.99 ml  Output -  Net 3344.99 ml   Filed Weights   12/31/18 1130  Weight: 121.7 kg   Examination: Physical Exam:  Constitutional: WN/WD morbidly obese Caucasian female in NAD and appears calm and comfortable Eyes: Lids and conjunctivae normal, sclerae anicteric  ENMT: External Ears, Nose appear normal. Grossly normal hearing. Mucous membranes are moist.   Neck: Appears normal, supple, no cervical masses, normal ROM, no appreciable thyromegaly; no JVD Respiratory: Diminished to auscultation bilaterally, no wheezing, rales, rhonchi or crackles. Normal respiratory effort and patient is not tachypenic. No accessory muscle use.  Cardiovascular: RRR but slightly on the slower side, no murmurs / rubs / gallops. S1 and S2 auscultated.  Abdomen: Soft, non-tender, Distended 2/2 body habitus. No masses palpated. No appreciable hepatosplenomegaly. Bowel sounds positive x4.  GU:  Deferred. Musculoskeletal: No clubbing / cyanosis of digits/nails. No joint deformity upper and lower extremities Skin: No rashes, lesions, ulcers on a limited skin evaluation. No induration; Warm and dry.  Neurologic: CN 2-12 grossly intact with no focal deficits.  Romberg sign and cerebellar reflexes not assessed.  Psychiatric: Normal judgment and insight. Alert and oriented x 3. Normal mood and appropriate affect.   Data Reviewed: I have personally reviewed following labs and imaging studies  CBC: Recent Labs  Lab 12/30/18 1539 12/31/18 0529 01/01/19 0708 01/02/19 0535 01/03/19 0501  WBC 10.2 10.9* 8.8 8.7 9.0  NEUTROABS 7.5  --   --   --   --   HGB 13.2 13.2 12.0 12.0 11.0*  HCT 41.0 39.5 37.2 36.5 35.3*  MCV 92.3 93.4 92.8 94.8 95.9  PLT 249 286 234 240 680   Basic Metabolic Panel: Recent Labs  Lab 12/30/18 1539 12/31/18 0529 01/01/19 0708 01/02/19 0535 01/03/19 0501  NA 132* 133* 134* 135 135  K 4.3 4.1 3.8 4.3 4.2  CL 98 98 101 104 105  CO2 24 24 22 22 24   GLUCOSE 99 90 91 96 92  BUN 19 21* 14 12 13   CREATININE 1.08* 0.99 0.78 0.83 0.82  CALCIUM 8.7* 8.6* 7.9* 8.0* 7.8*   GFR: Estimated Creatinine Clearance: 96.2 mL/min (by C-G formula based on SCr  of 0.82 mg/dL). Liver Function Tests: Recent Labs  Lab 12/30/18 1539  AST 24  ALT 21  ALKPHOS 54  BILITOT 0.6  PROT 7.1  ALBUMIN 3.4*   No results for input(s): LIPASE, AMYLASE in the last 168 hours. No results for input(s): AMMONIA in the last 168 hours. Coagulation Profile: Recent Labs  Lab 12/30/18 1539  INR 1.02   Cardiac Enzymes: No results for input(s): CKTOTAL, CKMB, CKMBINDEX, TROPONINI in the last 168 hours. BNP (last 3 results) No results for input(s): PROBNP in the last 8760 hours. HbA1C: No results for input(s): HGBA1C in the last 72 hours. CBG: Recent Labs  Lab 12/30/18 1155  GLUCAP 107*   Lipid Profile: No results for input(s): CHOL, HDL, LDLCALC, TRIG, CHOLHDL, LDLDIRECT in the  last 72 hours. Thyroid Function Tests: No results for input(s): TSH, T4TOTAL, FREET4, T3FREE, THYROIDAB in the last 72 hours. Anemia Panel: No results for input(s): VITAMINB12, FOLATE, FERRITIN, TIBC, IRON, RETICCTPCT in the last 72 hours. Sepsis Labs: No results for input(s): PROCALCITON, LATICACIDVEN in the last 168 hours.  No results found for this or any previous visit (from the past 240 hour(s)).   Radiology Studies: Ct Coronary Morph W/cta Cor W/score W/ca W/cm &/or Wo/cm  Addendum Date: 01/02/2019   ADDENDUM REPORT: 01/02/2019 17:53 CLINICAL DATA:  58 year old female with h/o carotid disease, morbid obesity and DOE. EXAM: Cardiac/Coronary  CT TECHNIQUE: The patient was scanned on a [employer]. FINDINGS: A 120 kV prospective scan was triggered in the descending thoracic aorta at 111 HU's. Axial non-contrast 3 mm slices were carried out through the heart. The data set was analyzed on a dedicated work station and scored using the [place]. Gantry rotation speed was 250 msecs and collimation was .6 mm. No beta blockade and 0.8 mg of sl NTG was given. The 3D data set was reconstructed in 5% intervals of the 67-82 % of the R-R cycle. Diastolic phases were analyzed on a dedicated work station using MPR, MIP and VRT modes. The patient received 80 cc of contrast. Aorta: Normal size. Mild diffuse atherosclerotic plaque and calcifications. No dissection. Aortic Valve:  Trileaflet.  No calcifications. Coronary Arteries:  Normal coronary origin.  Right dominance. RCA is a large dominant artery that gives rise to PDA and PLA. There is moderate diffuse mixed plaque with focal stenoses 50-60% in the mid and distal RCA, proximal PLA and mid PDA. Left main is a large artery that gives rise to LAD, a ramus intermedius and LCX arteries. Left main has no obvious plaque. LAD is a large vessel that gives rise to two small diagonal arteries. Proximal LAD has long diffuse moderate calcified plaque with  stenosis 50-69%. This is followed by a non-calcified plaque in the mid LAD with suspicion for stenosis > 70%. Distal LAD has only mild plaque. D1,2 are small with mild plaque. Ramus intermedius is a medium size artery with moderate calcified plaque in the proximal portion with stenosis 50-69%. LCX is a small caliber non-dominant artery that has mild diffuse plaque. Other findings: Normal pulmonary vein drainage into the left atrium. Normal let atrial appendage without a thrombus. Normal size of the pulmonary artery. IMPRESSION: 1. Coronary calcium score of 719. This was 23 percentile for age and sex matched control. 2. Normal coronary origin with right dominance. 3. Moderate to severe CAD with moderate stenosis in the mid and distal RCA, proximal PLA, mid PDA, proximal LAD and suspicion for severe stenosis in the mid LAD. Additional analysis with  CT FFR will be submitted. Electronically Signed   By: Ena Dawley   On: 01/02/2019 17:53   Result Date: 01/02/2019 EXAM: OVER-READ INTERPRETATION  CT CHEST The following report is an over-read performed by radiologist Dr. Abigail Miyamoto of [facility], [place] on 01/02/2019. This over-read does not include interpretation of cardiac or coronary anatomy or pathology. The coronary CTA interpretation by the cardiologist is attached. COMPARISON:  Chest radiograph 12/29/2018. Complete chest CT of 06/04/2011. FINDINGS: Vascular: Aortic atherosclerosis. No central pulmonary embolism, on this non-dedicated study. Mediastinum/Nodes: No imaged thoracic adenopathy. Lungs/Pleura: No imaged pleural fluid. Probable scarring at the left lung base. An area of nodularity including image 48/14 is similar (given differences in technique) to the 06/04/2011 exam and can be presumed benign. Upper Abdomen: Normal imaged portions of the liver, spleen, stomach. Musculoskeletal: No acute osseous abnormality. IMPRESSION: 1.  No acute findings in the imaged extracardiac chest. 2.  Aortic  Atherosclerosis (ICD10-I70.0). Electronically Signed: By: Abigail Miyamoto M.D. On: 01/02/2019 14:50   Ct Coronary Fractional Flow Reserve Data Prep  Result Date: 01/03/2019 EXAM: CT FFR ANALYSIS CLINICAL DATA:  58 year old obese patient with carotid disease and DOE. FINDINGS: FFRct analysis was performed on the original cardiac CT angiogram dataset. Diagrammatic representation of the FFRct analysis is provided in a separate PDF document in PACS. This dictation was created using the PDF document and an interactive 3D model of the results. 3D model is not available in the EMR/PACS. Normal FFR range is >0.80. 1. Left Main:  No significant stenosis. 2. LAD: Proximal: 0.91, mid 0.81, distal: 0.8. 3. LCX: No significant stenosis. 4. RCA: No significant stenosis. IMPRESSION: 1. CT FFR showed borderline stenosis in the proximal to mid LAD. Given that is it proximal LAD a cardiac catheterization is recommended. Electronically Signed   By: Ena Dawley   On: 01/03/2019 09:12   Scheduled Meds: . [MAR Hold] aspirin EC  325 mg Oral Daily  . [MAR Hold] atorvastatin  80 mg Oral Daily  . [MAR Hold] celecoxib  200 mg Oral BID  . [MAR Hold] citalopram  20 mg Oral Daily  . [MAR Hold] enoxaparin (LOVENOX) injection  40 mg Subcutaneous Q24H  . [MAR Hold] famotidine  40 mg Oral QHS  . [MAR Hold] levothyroxine  25 mcg Oral Q0600  . [MAR Hold] metoprolol tartrate  25 mg Oral BID  . [MAR Hold] montelukast  10 mg Oral Daily  . [MAR Hold] pantoprazole  40 mg Oral Daily   Continuous Infusions: . sodium chloride 100 mL/hr at 01/03/19 0342  . sodium chloride    . [MAR Hold]  ceFAZolin (ANCEF) IV      LOS: 4 days   Kerney Elbe, DO [facility] PAGER is on AMION  If 7PM-7AM, please contact night-coverage [email or web address] Password [facility] 01/03/2019, 12:47 PM

## 2019-01-03 NOTE — H&P (View-Only) (Signed)
Progress Note  Patient Name: Madison Medina Date of Encounter: 01/03/2019  Primary Cardiologist:     Subjective   58 year old female with a history of hypertension, morbid obesity, hyperlipidemia, hypothyroidism, COPD and ongoing cigarette abuse we are asked to see for preoperative evaluation prior to carotid endarterectomy.  Patient had a coronary CT angiogram yesterday which reveals borderline significant mid LAD stenosis.  The FFR was 0.8.  Inpatient Medications    Scheduled Meds: . aspirin EC  325 mg Oral Daily  . atorvastatin  80 mg Oral Daily  . celecoxib  200 mg Oral BID  . citalopram  20 mg Oral Daily  . enoxaparin (LOVENOX) injection  40 mg Subcutaneous Q24H  . famotidine  40 mg Oral QHS  . levothyroxine  25 mcg Oral Q0600  . metoprolol tartrate  25 mg Oral BID  . montelukast  10 mg Oral Daily  . pantoprazole  40 mg Oral Daily   Continuous Infusions: . sodium chloride 100 mL/hr at 01/03/19 0342  . [START ON 01/04/2019]  ceFAZolin (ANCEF) IV     PRN Meds: acetaminophen **OR** acetaminophen (TYLENOL) oral liquid 160 mg/5 mL **OR** acetaminophen, oxyCODONE-acetaminophen, senna-docusate   Vital Signs    Vitals:   01/02/19 1925 01/02/19 2222 01/02/19 2334 01/03/19 0319  BP: (!) 149/76  138/76 137/61  Pulse: (!) 55  (!) 59 (!) 57  Resp: 18 18 18 19   Temp: 98.3 F (36.8 C)  98.3 F (36.8 C) 98.1 F (36.7 C)  TempSrc: Oral  Oral Oral  SpO2: 98% 98% 99% 99%  Weight:      Height:        Intake/Output Summary (Last 24 hours) at 01/03/2019 0865 Last data filed at 01/03/2019 0600 Gross per 24 hour  Intake 3344.99 ml  Output -  Net 3344.99 ml   Last 3 Weights 12/31/2018  Weight (lbs) 268 lb 4.8 oz  Weight (kg) 121.7 kg      Telemetry     NSR - Personally Reviewed  ECG    NSR  - Personally Reviewed  Physical Exam    GEN:  Morbidly obese, middle-aged female, no acute distress Neck: No JVD Cardiac: RRR, no murmurs, rubs, or gallops.    Respiratory: Clear to auscultation bilaterally. GI: Soft, nontender, non-distended  MS: No edema; No deformity. Neuro:  Nonfocal  Psych: Normal affect   Labs    Chemistry Recent Labs  Lab 12/30/18 1539  01/01/19 0708 01/02/19 0535 01/03/19 0501  NA 132*   < > 134* 135 135  K 4.3   < > 3.8 4.3 4.2  CL 98   < > 101 104 105  CO2 24   < > 22 22 24   GLUCOSE 99   < > 91 96 92  BUN 19   < > 14 12 13   CREATININE 1.08*   < > 0.78 0.83 0.82  CALCIUM 8.7*   < > 7.9* 8.0* 7.8*  PROT 7.1  --   --   --   --   ALBUMIN 3.4*  --   --   --   --   AST 24  --   --   --   --   ALT 21  --   --   --   --   ALKPHOS 54  --   --   --   --   BILITOT 0.6  --   --   --   --   GFRNONAA 57*   < > >  60 >60 >60  GFRAA >60   < > >60 >60 >60  ANIONGAP 10   < > 11 9 6    < > = values in this interval not displayed.     Hematology Recent Labs  Lab 01/01/19 0708 01/02/19 0535 01/03/19 0501  WBC 8.8 8.7 9.0  RBC 4.01 3.85* 3.68*  HGB 12.0 12.0 11.0*  HCT 37.2 36.5 35.3*  MCV 92.8 94.8 95.9  MCH 29.9 31.2 29.9  MCHC 32.3 32.9 31.2  RDW 14.4 14.6 14.7  PLT 234 240 204    Cardiac EnzymesNo results for input(s): TROPONINI in the last 168 hours. No results for input(s): TROPIPOC in the last 168 hours.   BNPNo results for input(s): BNP, PROBNP in the last 168 hours.   DDimer No results for input(s): DDIMER in the last 168 hours.   Radiology    Ct Coronary Morph W/cta Cor W/score W/ca W/cm &/or Wo/cm  Addendum Date: 01/02/2019   ADDENDUM REPORT: 01/02/2019 17:53 CLINICAL DATA:  58 year old female with h/o carotid disease, morbid obesity and DOE. EXAM: Cardiac/Coronary  CT TECHNIQUE: The patient was scanned on a [employer]. FINDINGS: A 120 kV prospective scan was triggered in the descending thoracic aorta at 111 HU's. Axial non-contrast 3 mm slices were carried out through the heart. The data set was analyzed on a dedicated work station and scored using the [employer]. Gantry rotation  speed was 250 msecs and collimation was .6 mm. No beta blockade and 0.8 mg of sl NTG was given. The 3D data set was reconstructed in 5% intervals of the 67-82 % of the R-R cycle. Diastolic phases were analyzed on a dedicated work station using MPR, MIP and VRT modes. The patient received 80 cc of contrast. Aorta: Normal size. Mild diffuse atherosclerotic plaque and calcifications. No dissection. Aortic Valve:  Trileaflet.  No calcifications. Coronary Arteries:  Normal coronary origin.  Right dominance. RCA is a large dominant artery that gives rise to PDA and PLA. There is moderate diffuse mixed plaque with focal stenoses 50-60% in the mid and distal RCA, proximal PLA and mid PDA. Left main is a large artery that gives rise to LAD, a ramus intermedius and LCX arteries. Left main has no obvious plaque. LAD is a large vessel that gives rise to two small diagonal arteries. Proximal LAD has long diffuse moderate calcified plaque with stenosis 50-69%. This is followed by a non-calcified plaque in the mid LAD with suspicion for stenosis > 70%. Distal LAD has only mild plaque. D1,2 are small with mild plaque. Ramus intermedius is a medium size artery with moderate calcified plaque in the proximal portion with stenosis 50-69%. LCX is a small caliber non-dominant artery that has mild diffuse plaque. Other findings: Normal pulmonary vein drainage into the left atrium. Normal let atrial appendage without a thrombus. Normal size of the pulmonary artery. IMPRESSION: 1. Coronary calcium score of 719. This was 40 percentile for age and sex matched control. 2. Normal coronary origin with right dominance. 3. Moderate to severe CAD with moderate stenosis in the mid and distal RCA, proximal PLA, mid PDA, proximal LAD and suspicion for severe stenosis in the mid LAD. Additional analysis with CT FFR will be submitted. Electronically Signed   By: Ena Dawley   On: 01/02/2019 17:53   Result Date: 01/02/2019 EXAM: OVER-READ  INTERPRETATION  CT CHEST The following report is an over-read performed by radiologist Dr. Abigail Miyamoto of [facility], [place] on 01/02/2019. This over-read does not  include interpretation of cardiac or coronary anatomy or pathology. The coronary CTA interpretation by the cardiologist is attached. COMPARISON:  Chest radiograph 12/29/2018. Complete chest CT of 06/04/2011. FINDINGS: Vascular: Aortic atherosclerosis. No central pulmonary embolism, on this non-dedicated study. Mediastinum/Nodes: No imaged thoracic adenopathy. Lungs/Pleura: No imaged pleural fluid. Probable scarring at the left lung base. An area of nodularity including image 48/14 is similar (given differences in technique) to the 06/04/2011 exam and can be presumed benign. Upper Abdomen: Normal imaged portions of the liver, spleen, stomach. Musculoskeletal: No acute osseous abnormality. IMPRESSION: 1.  No acute findings in the imaged extracardiac chest. 2.  Aortic Atherosclerosis (ICD10-I70.0). Electronically Signed: By: Abigail Miyamoto M.D. On: 01/02/2019 14:50   Ct Coronary Fractional Flow Reserve Data Prep  Result Date: 01/03/2019 EXAM: CT FFR ANALYSIS CLINICAL DATA:  58 year old obese patient with carotid disease and DOE. FINDINGS: FFRct analysis was performed on the original cardiac CT angiogram dataset. Diagrammatic representation of the FFRct analysis is provided in a separate PDF document in PACS. This dictation was created using the PDF document and an interactive 3D model of the results. 3D model is not available in the EMR/PACS. Normal FFR range is >0.80. 1. Left Main:  No significant stenosis. 2. LAD: Proximal: 0.91, mid 0.81, distal: 0.8. 3. LCX: No significant stenosis. 4. RCA: No significant stenosis. IMPRESSION: 1. CT FFR showed borderline stenosis in the proximal to mid LAD. Given that is it proximal LAD a cardiac catheterization is recommended. Electronically Signed   By: Ena Dawley   On: 01/03/2019 09:12    Cardiac  Studies   Coronary CT angiogram reveals a borderline lesion in the mid LAD.  Patient Profile     58 y.o. female with multiple risk factors for coronary artery disease.  She has known carotid disease.  Assessment & Plan    1.  Coronary artery disease: The patient has multiple risk factors for coronary artery disease.  The coronary CT angiogram yesterday reveals a moderate stenosis in the mid LAD.  The FFR is 0.8. Be important for [place] to define her coronary anatomy prior to her carotid endarterectomy surgery. We will schedule her for heart catheterization today. We discussed the risks, benefits, options of heart catheterization.  She understands and agrees to proceed.  2.  Hypertension: Blood pressure seems well controlled.  Continue current medications for now.  3 .  Hyperlipidemia: Continue atorvastatin.        For questions or updates, please contact [place] Please consult [email or web address] for contact info under        Signed, Mertie Moores, MD  01/03/2019, 9:29 AM

## 2019-01-03 NOTE — H&P (View-Only) (Signed)
    Subjective  -   No new neurologic symptoms   Physical Exam:  Right arm strength a little better No lower extremity deficits CV:RRR    Cardiac cath today recommends medical management.   Patient OK for CEA   Assessment/Plan:    Spoke again with patient and family regarding left CEA tomorrow.  All questions answered.  Madison Medina 01/03/2019 9:43 PM --  Vitals:   01/03/19 1630 01/03/19 2001  BP: (!) 154/69 (!) 155/67  Pulse: (!) 50 (!) 53  Resp: 16   Temp: 97.8 F (36.6 C) 97.7 F (36.5 C)  SpO2: 97% 96%    Intake/Output Summary (Last 24 hours) at 01/03/2019 2143 Last data filed at 01/03/2019 1700 Gross per 24 hour  Intake 4272.53 ml  Output -  Net 4272.53 ml     Laboratory CBC    Component Value Date/Time   WBC 9.0 01/03/2019 0501   HGB 11.0 (L) 01/03/2019 0501   HCT 35.3 (L) 01/03/2019 0501   PLT 204 01/03/2019 0501    BMET    Component Value Date/Time   NA 135 01/03/2019 0501   K 4.2 01/03/2019 0501   CL 105 01/03/2019 0501   CO2 24 01/03/2019 0501   GLUCOSE 92 01/03/2019 0501   BUN 13 01/03/2019 0501   CREATININE 0.82 01/03/2019 0501   CALCIUM 7.8 (L) 01/03/2019 0501   GFRNONAA >60 01/03/2019 0501   GFRAA >60 01/03/2019 0501    COAG Lab Results  Component Value Date   INR 1.02 12/30/2018   INR 0.94 11/14/2009   No results found for: PTT  Antibiotics Anti-infectives (From admission, onward)   Start     Dose/Rate Route Frequency Ordered Stop   01/04/19 0600  ceFAZolin (ANCEF) IVPB 2g/100 mL premix    Note to Pharmacy:  Send with pt to OR   2 g 200 mL/hr over 30 Minutes Intravenous To Short Stay 01/03/19 0742 01/05/19 0600   01/04/19 0000  ceFAZolin (ANCEF) IVPB 1 g/50 mL premix    Note to Pharmacy:  Send with pt to OR   1 g 100 mL/hr over 30 Minutes Intravenous On call 01/03/19 2142 01/05/19 0000       V. Leia Alf, M.D. [facility] [place] Office: [phone] Pager:  [phone]

## 2019-01-03 NOTE — Progress Notes (Signed)
Progress Note  Patient Name: Madison Medina Date of Encounter: 01/03/2019  Primary Cardiologist:  Diem Pagnotta   Subjective   58 year old female with a history of hypertension, morbid obesity, hyperlipidemia, hypothyroidism, COPD and ongoing cigarette abuse we are asked to see for preoperative evaluation prior to carotid endarterectomy.  Patient had a coronary CT angiogram yesterday which reveals borderline significant mid LAD stenosis.  The FFR was 0.8.  Inpatient Medications    Scheduled Meds: . aspirin EC  325 mg Oral Daily  . atorvastatin  80 mg Oral Daily  . celecoxib  200 mg Oral BID  . citalopram  20 mg Oral Daily  . enoxaparin (LOVENOX) injection  40 mg Subcutaneous Q24H  . famotidine  40 mg Oral QHS  . levothyroxine  25 mcg Oral Q0600  . metoprolol tartrate  25 mg Oral BID  . montelukast  10 mg Oral Daily  . pantoprazole  40 mg Oral Daily   Continuous Infusions: . sodium chloride 100 mL/hr at 01/03/19 0342  . [START ON 01/04/2019]  ceFAZolin (ANCEF) IV     PRN Meds: acetaminophen **OR** acetaminophen (TYLENOL) oral liquid 160 mg/5 mL **OR** acetaminophen, oxyCODONE-acetaminophen, senna-docusate   Vital Signs    Vitals:   01/02/19 1925 01/02/19 2222 01/02/19 2334 01/03/19 0319  BP: (!) 149/76  138/76 137/61  Pulse: (!) 55  (!) 59 (!) 57  Resp: 18 18 18 19   Temp: 98.3 F (36.8 C)  98.3 F (36.8 C) 98.1 F (36.7 C)  TempSrc: Oral  Oral Oral  SpO2: 98% 98% 99% 99%  Weight:      Height:        Intake/Output Summary (Last 24 hours) at 01/03/2019 1443 Last data filed at 01/03/2019 0600 Gross per 24 hour  Intake 3344.99 ml  Output -  Net 3344.99 ml   Last 3 Weights 12/31/2018  Weight (lbs) 268 lb 4.8 oz  Weight (kg) 121.7 kg      Telemetry     NSR - Personally Reviewed  ECG    NSR  - Personally Reviewed  Physical Exam    GEN:  Morbidly obese, middle-aged female, no acute distress Neck: No JVD Cardiac: RRR, no murmurs, rubs, or gallops.    Respiratory: Clear to auscultation bilaterally. GI: Soft, nontender, non-distended  MS: No edema; No deformity. Neuro:  Nonfocal  Psych: Normal affect   Labs    Chemistry Recent Labs  Lab 12/30/18 1539  01/01/19 0708 01/02/19 0535 01/03/19 0501  NA 132*   < > 134* 135 135  K 4.3   < > 3.8 4.3 4.2  CL 98   < > 101 104 105  CO2 24   < > 22 22 24   GLUCOSE 99   < > 91 96 92  BUN 19   < > 14 12 13   CREATININE 1.08*   < > 0.78 0.83 0.82  CALCIUM 8.7*   < > 7.9* 8.0* 7.8*  PROT 7.1  --   --   --   --   ALBUMIN 3.4*  --   --   --   --   AST 24  --   --   --   --   ALT 21  --   --   --   --   ALKPHOS 54  --   --   --   --   BILITOT 0.6  --   --   --   --   GFRNONAA 57*   < > >  60 >60 >60  GFRAA >60   < > >60 >60 >60  ANIONGAP 10   < > 11 9 6    < > = values in this interval not displayed.     Hematology Recent Labs  Lab 01/01/19 0708 01/02/19 0535 01/03/19 0501  WBC 8.8 8.7 9.0  RBC 4.01 3.85* 3.68*  HGB 12.0 12.0 11.0*  HCT 37.2 36.5 35.3*  MCV 92.8 94.8 95.9  MCH 29.9 31.2 29.9  MCHC 32.3 32.9 31.2  RDW 14.4 14.6 14.7  PLT 234 240 204    Cardiac EnzymesNo results for input(s): TROPONINI in the last 168 hours. No results for input(s): TROPIPOC in the last 168 hours.   BNPNo results for input(s): BNP, PROBNP in the last 168 hours.   DDimer No results for input(s): DDIMER in the last 168 hours.   Radiology    Ct Coronary Morph W/cta Cor W/score W/ca W/cm &/or Wo/cm  Addendum Date: 01/02/2019   ADDENDUM REPORT: 01/02/2019 17:53 CLINICAL DATA:  58 year old female with h/o carotid disease, morbid obesity and DOE. EXAM: Cardiac/Coronary  CT TECHNIQUE: The patient was scanned on a [employer]. FINDINGS: A 120 kV prospective scan was triggered in the descending thoracic aorta at 111 HU's. Axial non-contrast 3 mm slices were carried out through the heart. The data set was analyzed on a dedicated work station and scored using the [employer]. Gantry rotation  speed was 250 msecs and collimation was .6 mm. No beta blockade and 0.8 mg of sl NTG was given. The 3D data set was reconstructed in 5% intervals of the 67-82 % of the R-R cycle. Diastolic phases were analyzed on a dedicated work station using MPR, MIP and VRT modes. The patient received 80 cc of contrast. Aorta: Normal size. Mild diffuse atherosclerotic plaque and calcifications. No dissection. Aortic Valve:  Trileaflet.  No calcifications. Coronary Arteries:  Normal coronary origin.  Right dominance. RCA is a large dominant artery that gives rise to PDA and PLA. There is moderate diffuse mixed plaque with focal stenoses 50-60% in the mid and distal RCA, proximal PLA and mid PDA. Left main is a large artery that gives rise to LAD, a ramus intermedius and LCX arteries. Left main has no obvious plaque. LAD is a large vessel that gives rise to two small diagonal arteries. Proximal LAD has long diffuse moderate calcified plaque with stenosis 50-69%. This is followed by a non-calcified plaque in the mid LAD with suspicion for stenosis > 70%. Distal LAD has only mild plaque. D1,2 are small with mild plaque. Ramus intermedius is a medium size artery with moderate calcified plaque in the proximal portion with stenosis 50-69%. LCX is a small caliber non-dominant artery that has mild diffuse plaque. Other findings: Normal pulmonary vein drainage into the left atrium. Normal let atrial appendage without a thrombus. Normal size of the pulmonary artery. IMPRESSION: 1. Coronary calcium score of 719. This was 37 percentile for age and sex matched control. 2. Normal coronary origin with right dominance. 3. Moderate to severe CAD with moderate stenosis in the mid and distal RCA, proximal PLA, mid PDA, proximal LAD and suspicion for severe stenosis in the mid LAD. Additional analysis with CT FFR will be submitted. Electronically Signed   By: Ena Dawley   On: 01/02/2019 17:53   Result Date: 01/02/2019 EXAM: OVER-READ  INTERPRETATION  CT CHEST The following report is an over-read performed by radiologist Dr. Abigail Miyamoto of [facility], [place] on 01/02/2019. This over-read does not  include interpretation of cardiac or coronary anatomy or pathology. The coronary CTA interpretation by the cardiologist is attached. COMPARISON:  Chest radiograph 12/29/2018. Complete chest CT of 06/04/2011. FINDINGS: Vascular: Aortic atherosclerosis. No central pulmonary embolism, on this non-dedicated study. Mediastinum/Nodes: No imaged thoracic adenopathy. Lungs/Pleura: No imaged pleural fluid. Probable scarring at the left lung base. An area of nodularity including image 48/14 is similar (given differences in technique) to the 06/04/2011 exam and can be presumed benign. Upper Abdomen: Normal imaged portions of the liver, spleen, stomach. Musculoskeletal: No acute osseous abnormality. IMPRESSION: 1.  No acute findings in the imaged extracardiac chest. 2.  Aortic Atherosclerosis (ICD10-I70.0). Electronically Signed: By: Abigail Miyamoto M.D. On: 01/02/2019 14:50   Ct Coronary Fractional Flow Reserve Data Prep  Result Date: 01/03/2019 EXAM: CT FFR ANALYSIS CLINICAL DATA:  58 year old obese patient with carotid disease and DOE. FINDINGS: FFRct analysis was performed on the original cardiac CT angiogram dataset. Diagrammatic representation of the FFRct analysis is provided in a separate PDF document in PACS. This dictation was created using the PDF document and an interactive 3D model of the results. 3D model is not available in the EMR/PACS. Normal FFR range is >0.80. 1. Left Main:  No significant stenosis. 2. LAD: Proximal: 0.91, mid 0.81, distal: 0.8. 3. LCX: No significant stenosis. 4. RCA: No significant stenosis. IMPRESSION: 1. CT FFR showed borderline stenosis in the proximal to mid LAD. Given that is it proximal LAD a cardiac catheterization is recommended. Electronically Signed   By: Ena Dawley   On: 01/03/2019 09:12    Cardiac  Studies   Coronary CT angiogram reveals a borderline lesion in the mid LAD.  Patient Profile     58 y.o. female with multiple risk factors for coronary artery disease.  She has known carotid disease.  Assessment & Plan    1.  Coronary artery disease: The patient has multiple risk factors for coronary artery disease.  The coronary CT angiogram yesterday reveals a moderate stenosis in the mid LAD.  The FFR is 0.8. Be important for [place] to define her coronary anatomy prior to her carotid endarterectomy surgery. We will schedule her for heart catheterization today. We discussed the risks, benefits, options of heart catheterization.  She understands and agrees to proceed.  2.  Hypertension: Blood pressure seems well controlled.  Continue current medications for now.  3 .  Hyperlipidemia: Continue atorvastatin.        For questions or updates, please contact [place] Please consult [email or web address] for contact info under        Signed, Mertie Moores, MD  01/03/2019, 9:29 AM

## 2019-01-03 NOTE — Progress Notes (Signed)
   I was called about HR trending 46-48, sinus brady. Pt is asymptomatic. I will discontinue beta blocker due to bradycardia.   Daune Perch, AGNP-C [facility] 01/03/2019  4:04 PM Pager: [phone]

## 2019-01-03 NOTE — Progress Notes (Signed)
OT Cancellation Note  Patient Details Name: NAZARIAH CADET MRN: [id number] DOB: 21-Jul-1961   Cancelled Treatment:    Reason Eval/Treat Not Completed: Other (comment)(pt recently returned from cath lab after procedure and has low HR at this time) OT will attempt to see pt at next available time.   Gypsy Decant, MS, OTR/L 01/03/2019, 3:59 PM

## 2019-01-03 NOTE — Progress Notes (Signed)
PT Cancellation Note  Patient Details Name: Madison Medina MRN: [id number] DOB: 1961/02/15   Cancelled Treatment:    Reason Eval/Treat Not Completed: Patient at procedure or test/unavailable pt about to go down to the cath lab. Will follow.   [name] 01/03/2019, 11:44 AM  Wray Kearns, PT, DPT Acute Rehabilitation Services Pager [phone] Office [phone]

## 2019-01-03 NOTE — Progress Notes (Signed)
    Subjective  -   No new neurologic symptoms   Physical Exam:  Right arm strength a little better No lower extremity deficits CV:RRR    Cardiac cath today recommends medical management.   Patient OK for CEA   Assessment/Plan:    Spoke again with patient and family regarding left CEA tomorrow.  All questions answered.  Wells Brabham 01/03/2019 9:43 PM --  Vitals:   01/03/19 1630 01/03/19 2001  BP: (!) 154/69 (!) 155/67  Pulse: (!) 50 (!) 53  Resp: 16   Temp: 97.8 F (36.6 C) 97.7 F (36.5 C)  SpO2: 97% 96%    Intake/Output Summary (Last 24 hours) at 01/03/2019 2143 Last data filed at 01/03/2019 1700 Gross per 24 hour  Intake 4272.53 ml  Output -  Net 4272.53 ml     Laboratory CBC    Component Value Date/Time   WBC 9.0 01/03/2019 0501   HGB 11.0 (L) 01/03/2019 0501   HCT 35.3 (L) 01/03/2019 0501   PLT 204 01/03/2019 0501    BMET    Component Value Date/Time   NA 135 01/03/2019 0501   K 4.2 01/03/2019 0501   CL 105 01/03/2019 0501   CO2 24 01/03/2019 0501   GLUCOSE 92 01/03/2019 0501   BUN 13 01/03/2019 0501   CREATININE 0.82 01/03/2019 0501   CALCIUM 7.8 (L) 01/03/2019 0501   GFRNONAA >60 01/03/2019 0501   GFRAA >60 01/03/2019 0501    COAG Lab Results  Component Value Date   INR 1.02 12/30/2018   INR 0.94 11/14/2009   No results found for: PTT  Antibiotics Anti-infectives (From admission, onward)   Start     Dose/Rate Route Frequency Ordered Stop   01/04/19 0600  ceFAZolin (ANCEF) IVPB 2g/100 mL premix    Note to Pharmacy:  Send with pt to OR   2 g 200 mL/hr over 30 Minutes Intravenous To Short Stay 01/03/19 0742 01/05/19 0600   01/04/19 0000  ceFAZolin (ANCEF) IVPB 1 g/50 mL premix    Note to Pharmacy:  Send with pt to OR   1 g 100 mL/hr over 30 Minutes Intravenous On call 01/03/19 2142 01/05/19 0000       V. Leia Alf, M.D. [facility] [place] Office: [phone] Pager:  [phone]

## 2019-01-03 NOTE — Interval H&P Note (Signed)
Cath Lab Visit (complete for each Cath Lab visit)  Clinical Evaluation Leading to the Procedure:   ACS: No.  Non-ACS:    Anginal Classification: No Symptoms  Anti-ischemic medical therapy: Minimal Therapy (1 class of medications)  Non-Invasive Test Results: Intermediate-risk stress test findings: cardiac mortality 1-3%/year  Prior CABG: No previous CABG      History and Physical Interval Note:  01/03/2019 12:20 PM  Madison Medina  has presented today for surgery, with the diagnosis of positive ct  The various methods of treatment have been discussed with the patient and family. After consideration of risks, benefits and other options for treatment, the patient has consented to  Procedure(s): LEFT HEART CATH AND CORONARY ANGIOGRAPHY (N/A) as a surgical intervention .  The patient's history has been reviewed, patient examined, no change in status, stable for surgery.  I have reviewed the patient's chart and labs.  Questions were answered to the patient's satisfaction.     Belva Crome III

## 2019-01-03 NOTE — CV Procedure (Signed)
   Indication is borderline significant FFR by CT FFR with intermediate stenosis by CT angiogram.  Right radial access using real-time vascular ultrasound.  Widely patent left main, mid LAD with less than 50% obstruction.  Widely patent ramus.  Small, widely patent circumflex.  Dominant, widely patent RCA.  60% stenosis in the mid to distal third of PDA.  Normal LV systolic function with EF greater than 60%.  EDP approximately 30 mmHg.  Medical management of coronary disease with aggressive secondary risk modification.

## 2019-01-04 ENCOUNTER — Encounter (HOSPITAL_COMMUNITY): Payer: Self-pay | Admitting: *Deleted

## 2019-01-04 ENCOUNTER — Inpatient Hospital Stay (HOSPITAL_COMMUNITY): Payer: Medicare Other | Admitting: Anesthesiology

## 2019-01-04 ENCOUNTER — Telehealth: Payer: Self-pay | Admitting: Surgery

## 2019-01-04 ENCOUNTER — Encounter (HOSPITAL_COMMUNITY)
Admission: AD | Disposition: A | Discharge status: Home / Self Care | Payer: Self-pay | Source: Other Acute Inpatient Hospital | Attending: Internal Medicine

## 2019-01-04 DIAGNOSIS — I6522 Occlusion and stenosis of left carotid artery: Secondary | ICD-10-CM

## 2019-01-04 HISTORY — PX: ENDARTERECTOMY: SHX5162

## 2019-01-04 HISTORY — PX: PATCH ANGIOPLASTY: SHX6230

## 2019-01-04 LAB — COMPREHENSIVE METABOLIC PANEL
ALT: 22 U/L (ref 0–44)
AST: 22 U/L (ref 15–41)
Albumin: 2.9 g/dL — ABNORMAL LOW (ref 3.5–5.0)
Alkaline Phosphatase: 57 U/L (ref 38–126)
Anion gap: 8 (ref 5–15)
BUN: 13 mg/dL (ref 6–20)
CO2: 24 mmol/L (ref 22–32)
Calcium: 8.1 mg/dL — ABNORMAL LOW (ref 8.9–10.3)
Chloride: 104 mmol/L (ref 98–111)
Creatinine, Ser: 0.97 mg/dL (ref 0.44–1.00)
GFR calc Af Amer: >60 mL/min (ref >60)
GFR calc non Af Amer: >60 mL/min (ref >60)
GLUCOSE: 156 mg/dL — AB (ref 70–99)
Potassium: 4.1 mmol/L (ref 3.5–5.1)
Sodium: 136 mmol/L (ref 135–145)
Total Bilirubin: 0.8 mg/dL (ref 0.3–1.2)
Total Protein: 6.1 g/dL — ABNORMAL LOW (ref 6.5–8.1)

## 2019-01-04 LAB — CBC WITH DIFFERENTIAL/PLATELET
ABS IMMATURE GRANULOCYTES: 0.16 10*3/uL — AB (ref 0.00–0.07)
BASOS ABS: 0 10*3/uL (ref 0.0–0.1)
Basophils Relative: 0 %
Eosinophils Absolute: 0 10*3/uL (ref 0.0–0.5)
Eosinophils Relative: 0 %
HCT: 36.2 % (ref 36.0–46.0)
Hemoglobin: 11.7 g/dL — ABNORMAL LOW (ref 12.0–15.0)
Immature Granulocytes: 1 %
LYMPHS PCT: 5 %
Lymphs Abs: 0.7 10*3/uL (ref 0.7–4.0)
MCH: 30 pg (ref 26.0–34.0)
MCHC: 32.3 g/dL (ref 30.0–36.0)
MCV: 92.8 fL (ref 80.0–100.0)
Monocytes Absolute: 0.3 10*3/uL (ref 0.1–1.0)
Monocytes Relative: 2 %
Neutro Abs: 12.6 10*3/uL — ABNORMAL HIGH (ref 1.7–7.7)
Neutrophils Relative %: 92 %
Platelets: 240 10*3/uL (ref 150–400)
RBC: 3.9 MIL/uL (ref 3.87–5.11)
RDW: 14.8 % (ref 11.5–15.5)
WBC: 13.8 10*3/uL — ABNORMAL HIGH (ref 4.0–10.5)
nRBC: 0 % (ref 0.0–0.2)

## 2019-01-04 LAB — TYPE AND SCREEN
ABO/RH(D): A POS
ANTIBODY SCREEN: NEGATIVE

## 2019-01-04 LAB — POCT ACTIVATED CLOTTING TIME: Activated Clotting Time: 268 seconds

## 2019-01-04 LAB — SURGICAL PCR SCREEN
MRSA, PCR: NEGATIVE
Staphylococcus aureus: POSITIVE — AB

## 2019-01-04 LAB — PROTIME-INR
INR: 1.05
Prothrombin Time: 13.6 seconds (ref 11.4–15.2)

## 2019-01-04 LAB — MAGNESIUM: Magnesium: 1.7 mg/dL (ref 1.7–2.4)

## 2019-01-04 LAB — ABO/RH: ABO/RH(D): A POS

## 2019-01-04 LAB — PHOSPHORUS: Phosphorus: 3.4 mg/dL (ref 2.5–4.6)

## 2019-01-04 SURGERY — ENDARTERECTOMY, CAROTID
Anesthesia: General | Site: Neck | Laterality: Left

## 2019-01-04 MED ORDER — SODIUM CHLORIDE 0.9 % IV SOLN
INTRAVENOUS | Status: DC | PRN
  Administered 2019-01-04: 40 ug/min via INTRAVENOUS

## 2019-01-04 MED ORDER — HEPARIN SODIUM (PORCINE) 1000 UNIT/ML IJ SOLN
INTRAMUSCULAR | Status: DC | PRN
  Administered 2019-01-04: 10000 [IU] via INTRAVENOUS

## 2019-01-04 MED ORDER — DEXAMETHASONE SODIUM PHOSPHATE 10 MG/ML IJ SOLN
INTRAMUSCULAR | Status: DC | PRN
  Administered 2019-01-04: 4 mg via INTRAVENOUS

## 2019-01-04 MED ORDER — ONDANSETRON HCL 4 MG/2ML IJ SOLN
INTRAMUSCULAR | Status: DC | PRN
  Administered 2019-01-04: 4 mg via INTRAVENOUS

## 2019-01-04 MED ORDER — LABETALOL HCL 5 MG/ML IV SOLN
10.0000 mg | INTRAVENOUS | Status: DC | PRN
  Administered 2019-01-04 – 2019-01-05 (×2): 10 mg via INTRAVENOUS
  Filled 2019-01-04: qty 4

## 2019-01-04 MED ORDER — LIDOCAINE 2% (20 MG/ML) 5 ML SYRINGE
INTRAMUSCULAR | Status: DC | PRN
  Administered 2019-01-04: 80 mg via INTRAVENOUS

## 2019-01-04 MED ORDER — SODIUM CHLORIDE 0.9 % IV SOLN: 500.0000 mL | Freq: Once | INTRAVENOUS | Status: DC | PRN

## 2019-01-04 MED ORDER — GLYCOPYRROLATE PF 0.2 MG/ML IJ SOSY
PREFILLED_SYRINGE | INTRAMUSCULAR | Status: AC
  Filled 2019-01-04: qty 1

## 2019-01-04 MED ORDER — CEFAZOLIN SODIUM-DEXTROSE 2-4 GM/100ML-% IV SOLN
2.0000 g | Freq: Three times a day (TID) | INTRAVENOUS | Status: AC
  Administered 2019-01-04 – 2019-01-05 (×2): 2 g via INTRAVENOUS
  Filled 2019-01-04 (×2): qty 100

## 2019-01-04 MED ORDER — SODIUM CHLORIDE 0.9 % IV SOLN
0.0125 ug/kg/min | INTRAVENOUS | Status: AC
  Administered 2019-01-04: 0.15 ug/kg/min via INTRAVENOUS
  Filled 2019-01-04: qty 2000

## 2019-01-04 MED ORDER — LABETALOL HCL 5 MG/ML IV SOLN
INTRAVENOUS | Status: AC
  Filled 2019-01-04: qty 4

## 2019-01-04 MED ORDER — OXYCODONE HCL 5 MG PO TABS: 5.0000 mg | ORAL_TABLET | Freq: Once | ORAL | Status: DC | PRN

## 2019-01-04 MED ORDER — POTASSIUM CHLORIDE CRYS ER 20 MEQ PO TBCR: 20.0000 meq | EXTENDED_RELEASE_TABLET | Freq: Every day | ORAL | Status: DC | PRN

## 2019-01-04 MED ORDER — HYDRALAZINE HCL 20 MG/ML IJ SOLN
5.0000 mg | Freq: Once | INTRAMUSCULAR | Status: AC
  Administered 2019-01-04: 5 mg via INTRAVENOUS

## 2019-01-04 MED ORDER — ROCURONIUM BROMIDE 10 MG/ML (PF) SYRINGE
PREFILLED_SYRINGE | INTRAVENOUS | Status: DC | PRN
  Administered 2019-01-04: 50 mg via INTRAVENOUS

## 2019-01-04 MED ORDER — HYDRALAZINE HCL 20 MG/ML IJ SOLN
INTRAMUSCULAR | Status: AC
  Administered 2019-01-04: 5 mg via INTRAVENOUS
  Filled 2019-01-04: qty 1

## 2019-01-04 MED ORDER — FENTANYL CITRATE (PF) 100 MCG/2ML IJ SOLN
25.0000 ug | INTRAMUSCULAR | Status: DC | PRN
  Administered 2019-01-04 (×2): 50 ug via INTRAVENOUS

## 2019-01-04 MED ORDER — EPHEDRINE SULFATE-NACL 50-0.9 MG/10ML-% IV SOSY
PREFILLED_SYRINGE | INTRAVENOUS | Status: DC | PRN
  Administered 2019-01-04: 10 mg via INTRAVENOUS

## 2019-01-04 MED ORDER — FENTANYL CITRATE (PF) 100 MCG/2ML IJ SOLN
INTRAMUSCULAR | Status: AC
  Administered 2019-01-04: 50 ug via INTRAVENOUS
  Filled 2019-01-04: qty 2

## 2019-01-04 MED ORDER — ONDANSETRON HCL 4 MG/2ML IJ SOLN
INTRAMUSCULAR | Status: AC
  Filled 2019-01-04: qty 2

## 2019-01-04 MED ORDER — HYDRALAZINE HCL 20 MG/ML IJ SOLN: 10.0000 mg | Freq: Once | INTRAMUSCULAR | Status: DC

## 2019-01-04 MED ORDER — HYDRALAZINE HCL 20 MG/ML IJ SOLN
INTRAMUSCULAR | Status: AC
  Filled 2019-01-04: qty 1

## 2019-01-04 MED ORDER — ACETAMINOPHEN 325 MG PO TABS: 325.0000 mg | ORAL_TABLET | ORAL | Status: DC | PRN

## 2019-01-04 MED ORDER — SODIUM CHLORIDE 0.9 % IV SOLN
INTRAVENOUS | Status: AC
  Filled 2019-01-04: qty 1.2

## 2019-01-04 MED ORDER — ASPIRIN EC 325 MG PO TBEC
325.0000 mg | DELAYED_RELEASE_TABLET | Freq: Every day | ORAL | Status: DC
  Administered 2019-01-04 – 2019-01-05 (×2): 325 mg via ORAL
  Filled 2019-01-04 (×2): qty 1

## 2019-01-04 MED ORDER — LIDOCAINE 2% (20 MG/ML) 5 ML SYRINGE
INTRAMUSCULAR | Status: AC
  Filled 2019-01-04: qty 5

## 2019-01-04 MED ORDER — CHLORHEXIDINE GLUCONATE CLOTH 2 % EX PADS: 6.0000 | MEDICATED_PAD | Freq: Every day | CUTANEOUS | Status: DC

## 2019-01-04 MED ORDER — PHENOL 1.4 % MT LIQD: 1.0000 | OROMUCOSAL | Status: DC | PRN

## 2019-01-04 MED ORDER — ONDANSETRON HCL 4 MG/2ML IJ SOLN: 4.0000 mg | Freq: Once | INTRAMUSCULAR | Status: DC | PRN

## 2019-01-04 MED ORDER — SUGAMMADEX SODIUM 500 MG/5ML IV SOLN
INTRAVENOUS | Status: AC
  Filled 2019-01-04: qty 10

## 2019-01-04 MED ORDER — SUGAMMADEX SODIUM 200 MG/2ML IV SOLN
INTRAVENOUS | Status: DC | PRN
  Administered 2019-01-04: 300 mg via INTRAVENOUS

## 2019-01-04 MED ORDER — SODIUM CHLORIDE 0.9 % IV SOLN
INTRAVENOUS | Status: DC | PRN
  Administered 2019-01-04: 500 mL

## 2019-01-04 MED ORDER — PROTAMINE SULFATE 10 MG/ML IV SOLN
INTRAVENOUS | Status: AC
  Filled 2019-01-04: qty 5

## 2019-01-04 MED ORDER — GUAIFENESIN-DM 100-10 MG/5ML PO SYRP: 15.0000 mL | ORAL_SOLUTION | ORAL | Status: DC | PRN

## 2019-01-04 MED ORDER — PROPOFOL 10 MG/ML IV BOLUS
INTRAVENOUS | Status: DC | PRN
  Administered 2019-01-04: 200 mg via INTRAVENOUS

## 2019-01-04 MED ORDER — DEXAMETHASONE SODIUM PHOSPHATE 10 MG/ML IJ SOLN
INTRAMUSCULAR | Status: AC
  Filled 2019-01-04: qty 1

## 2019-01-04 MED ORDER — PROTAMINE SULFATE 10 MG/ML IV SOLN
INTRAVENOUS | Status: DC | PRN
  Administered 2019-01-04: 50 mg via INTRAVENOUS

## 2019-01-04 MED ORDER — PANTOPRAZOLE SODIUM 40 MG PO TBEC
40.0000 mg | DELAYED_RELEASE_TABLET | Freq: Every day | ORAL | Status: DC
  Administered 2019-01-04 – 2019-01-05 (×2): 40 mg via ORAL
  Filled 2019-01-04 (×2): qty 1

## 2019-01-04 MED ORDER — DOCUSATE SODIUM 100 MG PO CAPS
100.0000 mg | ORAL_CAPSULE | Freq: Every day | ORAL | Status: DC
  Administered 2019-01-05: 100 mg via ORAL
  Filled 2019-01-04: qty 1

## 2019-01-04 MED ORDER — LACTATED RINGERS IV SOLN
INTRAVENOUS | Status: DC
  Administered 2019-01-04 (×3): via INTRAVENOUS

## 2019-01-04 MED ORDER — HYDRALAZINE HCL 20 MG/ML IJ SOLN: 5.0000 mg | Freq: Once | INTRAMUSCULAR | Status: DC

## 2019-01-04 MED ORDER — PHENYLEPHRINE 40 MCG/ML (10ML) SYRINGE FOR IV PUSH (FOR BLOOD PRESSURE SUPPORT)
PREFILLED_SYRINGE | INTRAVENOUS | Status: DC | PRN
  Administered 2019-01-04 (×2): 80 ug via INTRAVENOUS

## 2019-01-04 MED ORDER — METOPROLOL TARTRATE 5 MG/5ML IV SOLN: 2.0000 mg | INTRAVENOUS | Status: DC | PRN

## 2019-01-04 MED ORDER — HYDRALAZINE HCL 20 MG/ML IJ SOLN
5.0000 mg | INTRAMUSCULAR | Status: DC | PRN
  Administered 2019-01-05: 5 mg via INTRAVENOUS
  Filled 2019-01-04: qty 1

## 2019-01-04 MED ORDER — HEMOSTATIC AGENTS (NO CHARGE) OPTIME
TOPICAL | Status: DC | PRN
  Administered 2019-01-04: 1 via TOPICAL

## 2019-01-04 MED ORDER — GLYCOPYRROLATE PF 0.2 MG/ML IJ SOSY
PREFILLED_SYRINGE | INTRAMUSCULAR | Status: DC | PRN
  Administered 2019-01-04: .2 mg via INTRAVENOUS

## 2019-01-04 MED ORDER — ALUM & MAG HYDROXIDE-SIMETH 200-200-20 MG/5ML PO SUSP: 15.0000 mL | ORAL | Status: DC | PRN

## 2019-01-04 MED ORDER — OXYCODONE HCL 5 MG/5ML PO SOLN: 5.0000 mg | Freq: Once | ORAL | Status: DC | PRN

## 2019-01-04 MED ORDER — 0.9 % SODIUM CHLORIDE (POUR BTL) OPTIME
TOPICAL | Status: DC | PRN
  Administered 2019-01-04: 2000 mL

## 2019-01-04 MED ORDER — ONDANSETRON HCL 4 MG/2ML IJ SOLN: 4.0000 mg | Freq: Four times a day (QID) | INTRAMUSCULAR | Status: DC | PRN

## 2019-01-04 MED ORDER — ACETAMINOPHEN 325 MG RE SUPP: 325.0000 mg | RECTAL | Status: DC | PRN

## 2019-01-04 MED ORDER — MAGNESIUM SULFATE 2 GM/50ML IV SOLN: 2.0000 g | Freq: Every day | INTRAVENOUS | Status: DC | PRN

## 2019-01-04 SURGICAL SUPPLY — 49 items
CANISTER SUCT 3000ML PPV (MISCELLANEOUS) ×2 IMPLANT
CATH ROBINSON RED A/P 18FR (CATHETERS) ×2 IMPLANT
CATH SUCT 10FR WHISTLE TIP (CATHETERS) ×2 IMPLANT
CLIP VESOCCLUDE MED 6/CT (CLIP) ×3 IMPLANT
CLIP VESOCCLUDE SM WIDE 6/CT (CLIP) ×3 IMPLANT
COVER PROBE W GEL 5X96 (DRAPES) IMPLANT
COVER SURGICAL LIGHT HANDLE (MISCELLANEOUS) ×1 IMPLANT
COVER WAND RF STERILE (DRAPES) ×2 IMPLANT
CRADLE DONUT ADULT HEAD (MISCELLANEOUS) ×2 IMPLANT
DERMABOND ADHESIVE PROPEN (GAUZE/BANDAGES/DRESSINGS) ×1
DERMABOND ADVANCED (GAUZE/BANDAGES/DRESSINGS) ×1
DERMABOND ADVANCED .7 DNX12 (GAUZE/BANDAGES/DRESSINGS) ×1 IMPLANT
DERMABOND ADVANCED .7 DNX6 (GAUZE/BANDAGES/DRESSINGS) IMPLANT
DRAIN CHANNEL 15F RND FF W/TCR (WOUND CARE) ×1 IMPLANT
ELECT REM PT RETURN 9FT ADLT (ELECTROSURGICAL) ×2
ELECTRODE REM PT RTRN 9FT ADLT (ELECTROSURGICAL) ×1 IMPLANT
EVACUATOR SILICONE 100CC (DRAIN) ×1 IMPLANT
GLOVE BIOGEL PI IND STRL 7.5 (GLOVE) ×1 IMPLANT
GLOVE BIOGEL PI INDICATOR 7.5 (GLOVE) ×1
GLOVE SURG SS PI 7.5 STRL IVOR (GLOVE) ×2 IMPLANT
GOWN STRL REUS W/ TWL LRG LVL3 (GOWN DISPOSABLE) ×2 IMPLANT
GOWN STRL REUS W/ TWL XL LVL3 (GOWN DISPOSABLE) ×1 IMPLANT
GOWN STRL REUS W/TWL LRG LVL3 (GOWN DISPOSABLE) ×2
GOWN STRL REUS W/TWL XL LVL3 (GOWN DISPOSABLE) ×1
HEMOSTAT SNOW SURGICEL 2X4 (HEMOSTASIS) IMPLANT
INSERT FOGARTY SM (MISCELLANEOUS) IMPLANT
KIT BASIN OR (CUSTOM PROCEDURE TRAY) ×2 IMPLANT
KIT SHUNT ARGYLE CAROTID ART 6 (VASCULAR PRODUCTS) ×1 IMPLANT
KIT TURNOVER KIT B (KITS) ×2 IMPLANT
NDL HYPO 25GX1X1/2 BEV (NEEDLE) IMPLANT
NEEDLE HYPO 25GX1X1/2 BEV (NEEDLE) IMPLANT
NS IRRIG 1000ML POUR BTL (IV SOLUTION) ×6 IMPLANT
PACK CAROTID (CUSTOM PROCEDURE TRAY) ×2 IMPLANT
PAD ARMBOARD 7.5X6 YLW CONV (MISCELLANEOUS) ×4 IMPLANT
PATCH VASC XENOSURE 1CMX6CM (Vascular Products) ×1 IMPLANT
PATCH VASC XENOSURE 1X6 (Vascular Products) IMPLANT
SHUNT CAROTID BYPASS 10 (VASCULAR PRODUCTS) IMPLANT
SHUNT CAROTID BYPASS 12FRX15.5 (VASCULAR PRODUCTS) IMPLANT
SUT ETHILON 3 0 PS 1 (SUTURE) IMPLANT
SUT PROLENE 6 0 BV (SUTURE) ×3 IMPLANT
SUT PROLENE 7 0 BV 1 (SUTURE) IMPLANT
SUT SILK 3 0 (SUTURE) ×1
SUT SILK 3-0 18XBRD TIE 12 (SUTURE) IMPLANT
SUT VIC AB 3-0 SH 27 (SUTURE) ×2
SUT VIC AB 3-0 SH 27X BRD (SUTURE) ×2 IMPLANT
SUT VICRYL 4-0 PS2 18IN ABS (SUTURE) ×2 IMPLANT
SYR CONTROL 10ML LL (SYRINGE) IMPLANT
TOWEL GREEN STERILE (TOWEL DISPOSABLE) ×2 IMPLANT
WATER STERILE IRR 1000ML POUR (IV SOLUTION) ×2 IMPLANT

## 2019-01-04 NOTE — Progress Notes (Signed)
Pt transferred to 4E-13 from PACU via bed. CHG bath given. VSS, neuro intact. Tele applied,CCMD notified. Pt oriented to call bell, room, and bed. Call bell within reach. Will continue to monitor.  Amanda Cockayne, RN

## 2019-01-04 NOTE — Progress Notes (Signed)
PT Cancellation Note  Patient Details Name: Madison Medina MRN: [id number] DOB: 1961/07/31   Cancelled Treatment:    Reason Eval/Treat Not Completed: Patient at procedure or test/unavailable. PT will continue to follow acutely as available and appropriate.   [place] 01/04/2019, 8:52 AM

## 2019-01-04 NOTE — Anesthesia Procedure Notes (Signed)
Procedure Name: Intubation Date/Time: 01/04/2019 10:09 AM Performed by: Myna Bright, CRNA Pre-anesthesia Checklist: Patient identified, Emergency Drugs available, Suction available and Patient being monitored Patient Re-evaluated:Patient Re-evaluated prior to induction Oxygen Delivery Method: Circle system utilized Preoxygenation: Pre-oxygenation with 100% oxygen Induction Type: IV induction Ventilation: Mask ventilation without difficulty Laryngoscope Size: Mac and 4 Grade View: Grade I Tube type: Oral Tube size: 7.0 mm Number of attempts: 1 Airway Equipment and Method: Stylet Placement Confirmation: ETT inserted through vocal cords under direct vision,  positive ETCO2 and breath sounds checked- equal and bilateral Secured at: 21 cm Tube secured with: Tape Dental Injury: Teeth and Oropharynx as per pre-operative assessment

## 2019-01-04 NOTE — Progress Notes (Signed)
  Speech Language Pathology Treatment: Cognitive-Linquistic  Patient Details Name: Madison Medina MRN: [id number] DOB: 1961/02/17 Today's Date: 01/04/2019 Time: 1641-1700 SLP Time Calculation (min) (ACUTE ONLY): 19 min  Assessment / Plan / Recommendation Clinical Impression  Pt was seen for aphasia treatment. She was alert and cooperative throughout the session and she reported that her difficulty with word retrieval is improving. Pt was oriented to person and place but required cues for the accurate date. She demonstrated 50% accuracy with responsive naming increasing to 100% accuracy with semantic and phonemic cues, and 80% accuracy with 2-step commands and sentence completion increasing to 100% accuracy with minimal cues. During divergent naming tasks, she provided 2-4 items per category within a 1-minute period with an average of three items per category. Her language skills do appear improved compared to her initial evaluation but, per the pt, are still not at baseline. SLP will continue to follow pt.    HPI HPI: 58 y.o. female with medical history significant for HTN, obesity, HLD, hypothyroidism, COPD and 80 pack-year smoking hx (current smoker) who was transferred to [facility] today from [facility] for acute CVA.       SLP Plan  Continue with current plan of care       Recommendations  Diet recommendations: Regular;Thin liquid Liquids provided via: Cup;Straw Medication Administration: Whole meds with liquid                Oral Care Recommendations: Oral care BID Follow up Recommendations: Inpatient Rehab SLP Visit Diagnosis: Aphasia (R47.01);Cognitive communication deficit (R41.841) Plan: Continue with current plan of care       Sharisa Toves I. Hardin Negus, [place], [place] Office number [phone] Pager [place] 01/04/2019, 5:02 PM

## 2019-01-04 NOTE — Anesthesia Procedure Notes (Signed)
Arterial Line Insertion Start/End1/22/2020 9:00 AM, 01/04/2019 9:09 AM Performed by: Glynda Jaeger, CRNA, CRNA  Preanesthetic checklist: patient identified, IV checked, site marked, risks and benefits discussed, surgical consent, monitors and equipment checked, pre-op evaluation, timeout performed and anesthesia consent Right, radial was placed Catheter size: 20 G Hand hygiene performed  and maximum sterile barriers used  Allen's test indicative of satisfactory collateral circulation Attempts: 1 Procedure performed without using ultrasound guided technique. Following insertion, dressing applied and Biopatch. Post procedure assessment: normal  Patient tolerated the procedure well with no immediate complications.

## 2019-01-04 NOTE — Telephone Encounter (Signed)
Sh appt lvm mld ltr 01/23/2019 230pm p/o MD

## 2019-01-04 NOTE — Anesthesia Preprocedure Evaluation (Addendum)
Anesthesia Evaluation  Patient identified by MRN, date of birth, ID band Patient awake    Reviewed: Allergy & Precautions, NPO status , Patient's Chart, lab work & pertinent test results  History of Anesthesia Complications Negative for: history of anesthetic complications  Airway Mallampati: III  TM Distance: <3 FB Neck ROM: Full    Dental  (+) Dental Advisory Given, Edentulous Upper, Edentulous Lower   Pulmonary asthma , COPD,  COPD inhaler, Current Smoker,    breath sounds clear to auscultation       Cardiovascular hypertension, Pt. on medications + CAD and + Peripheral Vascular Disease   Rhythm:Regular Rate:Normal   '20 Cath - Widely patent left main LAD is tortuous and contains segmental mid vessel 40 to 50% narrowing.  No focal high-grade obstruction is noted. Second diagonal is large and also free of significant obstruction. A large ramus intermedius branch contains mid vessel irregularity but no high-grade obstruction. The circumflex system is relatively small in distribution.  No significant obstruction. The right coronary is dominant.  Diffuse irregularities up to 40% from proximal to distal vessel.  PDA contains eccentric 60% narrowing in the mid third. LVEF greater than 60% with EDP greater than 25 to 30 mmHg.  '20 TTE - Mild LVH. EF 55%. Grade 2 diastolic dysfunction.  '20 Head/Neck CT Angio - Right carotid system: No brachiocephalic artery stenosis despite plaque. Soft plaque in the proximal right CCA without stenosis. At the right carotid bifurcation there is complex soft and calcified plaque resulting in a short segment high-grade stenosis. Numerically this is estimated at 70%. Plaque continues to the distal right ICA bulb. The vessel remains patent with no additional stenosis below the skull base. Left carotid system: Soft and calcified plaque at the left CCA origin resulting in less than 50% stenosis with  respect to the distal vessel. Widespread low-density plaque in the medial left CCA in the neck. Diffuse vessel irregularity to the bifurcation. At the left ICA origin and bulb there are tandem radiographic string sign stenoses and near occlusion of the vessel. However, it does remain patent with preserved enhancement to the skull base. There is calcified plaque below the skull base which is not hemodynamically significant.    Neuro/Psych PSYCHIATRIC DISORDERS Anxiety    GI/Hepatic Neg liver ROS, GERD  Medicated and Poorly Controlled,  Endo/Other  Hypothyroidism Morbid obesity  Renal/GU Renal disease     Musculoskeletal negative musculoskeletal ROS (+)   Abdominal   Peds  Hematology  (+) anemia ,   Anesthesia Other Findings   Reproductive/Obstetrics                           Anesthesia Physical Anesthesia Plan  ASA: III  Anesthesia Plan: General   Post-op Pain Management:    Induction: Intravenous  PONV Risk Score and Plan: 4 or greater and Treatment may vary due to age or medical condition, Ondansetron, Dexamethasone and Midazolam  Airway Management Planned: Oral ETT  Additional Equipment: Arterial line  Intra-op Plan:   Post-operative Plan: Extubation in OR  Informed Consent: I have reviewed the patients History and Physical, chart, labs and discussed the procedure including the risks, benefits and alternatives for the proposed anesthesia with the patient or authorized representative who has indicated his/her understanding and acceptance.     Dental advisory given  Plan Discussed with: CRNA and Anesthesiologist  Anesthesia Plan Comments:        Anesthesia Quick Evaluation

## 2019-01-04 NOTE — Op Note (Signed)
Patient name: Madison Medina MRN: [id number] DOB: November 11, 1961 Sex: female  01/04/2019 Pre-operative Diagnosis: Symptomatic   left carotid stenosis Post-operative diagnosis:  Same Surgeon:  Annamarie Major Assistants:  Laurence Slate Procedure:    left carotid Endarterectomy with bovine pericardial patch angioplasty Anesthesia:  General Blood Loss:  100cc Specimens:  none  Findings:  90 %stenosis; Thrombus:  none  Indications: The patient presented with slurred speech and right arm weakness.  She was found to have an acute stroke on MRI.  CT scan of the neck revealed possible near occlusion of the left carotid artery 90% stenosis of the right.  A repeat CT scan confirmed patency of the carotid artery on the left.  Her symptoms are improving.  She underwent cardiac clearance.  She is here today for endarterectomy.  The risk and benefits of the procedure were extensively discussed with the patient and family and she wished to proceed.  Procedure:  The patient was identified in the holding area and taken to [place] 16  The patient was then placed supine on the table.   General endotrachial anesthesia was administered.  The patient was prepped and draped in the usual sterile fashion.  A time out was called and antibiotics were administered.  The incision was made along the anterior border of the left sternocleidomastoid muscle.  Cautery was used to dissect through the subcutaneous tissue.  The platysma muscle was divided with cautery.  The internal jugular vein was exposed along its anterior medial border.  The common facial vein was exposed and then divided between 2-0 silk ties and metal clips.  The common carotid artery was then circumferentially exposed and encircled with an umbilical tape.  The vagus nerve was identified and protected.  Next sharp dissection was used to expose the external carotid artery and the superior thyroid artery.  The were encircled with a blue vessel loop and a 2-0 silk tie  respectively.  Finally, the internal carotid was carefully dissected free.  An umbilical tape was placed around the internal carotid artery distal to the diseased segment.  The hypoglossal nerve was visualized throughout and protected.  The patient was given systemic heparinization.  A bovine carotid patch was selected and prepared on the back table.  A 10 french shunt was also prepared.  After blood pressure readings were appropriate and the heparin had been given time to circulate, the internal carotid artery was occluded with a baby Gregory clamp.  The external and common carotid arteries were then occluded with vascular clamps and the 2-0 tie tightened on the superior thyroid artery.  A #11 blade was used to make an arteriotomy in the common carotid artery.  This was extended with Potts scissors along the anterior and lateral border of the common and internal carotid artery.  Approximately 90% stenosis was identified.  There was no thrombus identified.  The 10 french shunt was  placed, and as the internal carotid artery was very small and there was excellent backbleeding.  Because of the depth of the artery, visualization was shunt would be limited..  A kleiner kuntz elevator was used to perform endarterectomy.  An eversion endarterectomy was performed in the external carotid artery.  A good distal endpoint was obtained in the internal carotid artery.  The specimen was removed and sent to pathology.  Heparinized saline was used to irrigate the endarterectomized field.  All potential embolic debris was removed.  Bovine pericardial patch angioplasty was then performed using a running 6-0  Prolene.  The common internal and external carotid arteries were all appropriately flushed. The artery was again irrigated with heparin saline.  The anastomosis was then secured. The clamp was first released on the external carotid artery followed by the common carotid artery approximately 30 seconds later, bloodflow was  reestablish through the internal carotid artery.  Next, a hand-held  Doppler was used to evaluate the signals in the common, external, and internal  carotid arteries, all of which had appropriate signals. I then administered  50 mg protamine. The wound was then irrigated.  After hemostasis was achieved, the carotid sheath was reapproximated with 3-0 Vicryl. The  platysma muscle was reapproximated with running 3-0 Vicryl. The skin  was closed with 4-0 Vicryl. Dermabond was placed on the skin. The  patient was then successfully extubated. Her neurologic exam was  similar to his preprocedural exam. The patient was then taken to recovery room  in stable condition. There were no complications.     Disposition:  To PACU in stable condition.  Relevant Operative Details: Multiple venous branches had to be ligated within the incision.  The artery was very deep within the neck making exposure somewhat challenging.  She had a nearly occlusive lesion at the bifurcation.  A bovine patch was utilized.  Theotis Burrow, M.D. [facility] Office: [phone] Pager:  [phone]

## 2019-01-04 NOTE — Interval H&P Note (Signed)
History and Physical Interval Note:  01/04/2019 9:18 AM  Madison Medina  has presented today for surgery, with the diagnosis of LEFT CAROTID ARTERY STENOSIS  The various methods of treatment have been discussed with the patient and family. After consideration of risks, benefits and other options for treatment, the patient has consented to  Procedure(s): ENDARTERECTOMY CAROTID LEFT (Left) as a surgical intervention .  The patient's history has been reviewed, patient examined, no change in status, stable for surgery.  I have reviewed the patient's chart and labs.  Questions were answered to the patient's satisfaction.     Madison Medina

## 2019-01-04 NOTE — Plan of Care (Signed)
  Problem: Education: Goal: Knowledge of disease or condition will improve Outcome: Progressing   Problem: Self-Care: Goal: Ability to communicate needs accurately will improve Outcome: Progressing   Problem: Nutrition: Goal: Risk of aspiration will decrease Outcome: Progressing   Problem: Clinical Measurements: Goal: Ability to maintain clinical measurements within normal limits will improve Outcome: Progressing

## 2019-01-04 NOTE — Progress Notes (Signed)
PROGRESS NOTE   Madison Medina  FYB:[id number]    DOB: [birth year]    DOA: 12/30/2018  PCP: Welford Roche, NP   I have briefly reviewed patients previous medical records in [facility].  Brief Narrative:  58 year old female with PMH of HTN, obesity, HLD, hypothyroid, COPD, tobacco abuse, transferred to [facility] from [facility] on 12/30/2018 for acute CVA.  The morning of January 15 she had a breast biopsy at [facility] and around that time she had acute onset of right upper extremity tingling and numbness and shortly thereafter developed right lower extremity weakness.  The next morning around 9 AM family noticed that patient was mumbling and had garbled speech but she herself was unaware of her speech and was able to understand what people said.  However later that day she became confused.  She was admitted for acute embolic stroke.  Neurology was consulted.  She completed stroke work-up.  Her stroke was felt to be due to severe carotid artery stenosis.  After cardiology performed cardiac cath and cleared her for procedure, she underwent carotid endarterectomy by vascular surgery on 01/04/2018.   Assessment & Plan:   Principal Problem:   CVA (cerebral vascular accident) ([place]) Active Problems:   HTN (hypertension)   Anxiety   Hypothyroid   Familial combined hyperlipidemia   Tobacco use   COPD (chronic obstructive pulmonary disease) ([facility])   AKI (acute kidney injury) ([place])   Hyponatremia   CAD (coronary artery disease)   Acute stroke Resultant aphasia and right hemiparesis Etiology: Embolic and watershed strokes left hemisphere due to severe carotid artery stenosis at the carotid bulb and proximal ICA CT head: No hemorrhage, mild chronic cerebral white matter changes. MRI head: Patchy acute infarcts left cerebral hemisphere with involvement of the deep watershed zones. CTA head and neck: Left proximal ICA near occlusion with severe threadlike stenosis and mixed plaque.  Right proximal ICA  severe 90% stenosis with mixed plaque. TTE LDL 183 A1c 5.7 Not on aspirin prior to admission, now on aspirin 325 mg daily. Neurology consulted and assisted with evaluation and management, recommended vascular surgery evaluation for bilateral severe carotid atherosclerosis and signed off 1/18.  Carotid artery disease/symptomatic left carotid stenosis After cardiac clearance, vascular surgery performed left carotid endarterectomy with bovine pericardial patch angioplasty on 1/22.  Essential hypertension Allow for permissive hypertension.  Currently on PRN hydralazine and labetalol post surgery.  Hyperlipidemia LDL 183, goal <70.  Started atorvastatin 80 mg daily.  Glucose intolerance/prediabetes A1c 5.7, goal <7.  Hypothyroid Continue Synthroid.  Tobacco abuse Cessation counseled.  Morbid obesity/Body mass index is 46.05 kg/m.  Medication noncompliance  CAD Status post cardiac cath this admission.  Medical management.  COPD Stable without clinical bronchospasm.  Recently diagnosed breast cancer  GERD Continue Pepcid.  Anxiety and depression Continue Celexa.  Anemia Stable.  Follow CBC in a.m.  DVT prophylaxis: Lovenox Code Status: Full Family Communication: None at bedside Disposition:?  DC home pending clinical improvement.   Consultants:  Neurology Vascular surgery Cardiology  Procedures:  Cardiac cath 1/21 Left CEA 1/22  Antimicrobials:  None   Subjective: Patient seen postop.  Reports that she feels "okay".  Ongoing right-sided weakness.  No pain reported  ROS: As above, otherwise negative.  Objective:  Vitals:   01/04/19 1351 01/04/19 1405 01/04/19 1440 01/04/19 1644  BP: (!) 154/65 124/60 (!) 151/58 (!) 146/64  Pulse: 81 75 81   Resp: 18 18 18    Temp:  (!) 97.4 F (36.3 C) 97.8  F (36.6 C) 98.6 F (37 C)  TempSrc:   Oral Oral  SpO2: 97% 97% 95%   Weight:      Height:        Examination:  General exam: Pleasant middle-aged  female, moderately built and morbidly obese lying comfortably propped up in bed. Respiratory system: Clear to auscultation. Respiratory effort normal. Cardiovascular system: S1 & S2 heard, RRR. No JVD, murmurs, rubs, gallops or clicks. No pedal edema.  Telemetry personally reviewed: Mostly sinus bradycardia in the 50s.  Occasional sinus bradycardia in the 40s. Gastrointestinal system: Abdomen is nondistended, soft and nontender. No organomegaly or masses felt. Normal bowel sounds heard. Central nervous system: Alert and oriented x3.  No facial asymmetry, dysarthria or aphasia noted at this time. Extremities: Right limbs grade 4 x 5 power.  Left limbs grade 5 x 5 power. Skin: No rashes, lesions or ulcers Psychiatry: Judgement and insight appear normal. Mood & affect appropriate. Neck: Left CEA site without bleeding and has a drain.    Data Reviewed: I have personally reviewed following labs and imaging studies  CBC: Recent Labs  Lab 12/30/18 1539 12/31/18 0529 01/01/19 0708 01/02/19 0535 01/03/19 0501 01/04/19 1613  WBC 10.2 10.9* 8.8 8.7 9.0 13.8*  NEUTROABS 7.5  --   --   --   --  12.6*  HGB 13.2 13.2 12.0 12.0 11.0* 11.7*  HCT 41.0 39.5 37.2 36.5 35.3* 36.2  MCV 92.3 93.4 92.8 94.8 95.9 92.8  PLT 249 286 234 240 204 379   Basic Metabolic Panel: Recent Labs  Lab 12/31/18 0529 01/01/19 0708 01/02/19 0535 01/03/19 0501 01/04/19 1613  NA 133* 134* 135 135 136  K 4.1 3.8 4.3 4.2 4.1  CL 98 101 104 105 104  CO2 24 22 22 24 24   GLUCOSE 90 91 96 92 156*  BUN 21* 14 12 13 13   CREATININE 0.99 0.78 0.83 0.82 0.97  CALCIUM 8.6* 7.9* 8.0* 7.8* 8.1*  MG  --   --   --   --  1.7  PHOS  --   --   --   --  3.4   Liver Function Tests: Recent Labs  Lab 12/30/18 1539 01/04/19 1613  AST 24 22  ALT 21 22  ALKPHOS 54 57  BILITOT 0.6 0.8  PROT 7.1 6.1*  ALBUMIN 3.4* 2.9*   Coagulation Profile: Recent Labs  Lab 12/30/18 1539 01/04/19 1613  INR 1.02 1.05   CBG: Recent Labs   Lab 12/30/18 1155  GLUCAP 107*    Recent Results (from the past 240 hour(s))  Surgical PCR screen     Status: Abnormal   Collection Time: 01/03/19 10:35 PM  Result Value Ref Range Status   MRSA, PCR NEGATIVE NEGATIVE Final   Staphylococcus aureus POSITIVE (A) NEGATIVE Final    Comment: (NOTE) The Xpert SA Assay (FDA approved for NASAL specimens in patients 33 years of age and older), is one component of a comprehensive surveillance program. It is not intended to diagnose infection nor to guide or monitor treatment. Performed at [facility], [place] [address] [place], [place] [address]          Radiology Studies: No results found.      Scheduled Meds: . aspirin EC  325 mg Oral Daily  . atorvastatin  80 mg Oral Daily  . Chlorhexidine Gluconate Cloth  6 each Topical Daily  . citalopram  20 mg Oral Daily  . [START ON 01/05/2019] docusate sodium  100 mg  Oral Daily  . enoxaparin (LOVENOX) injection  40 mg Subcutaneous Q24H  . famotidine  40 mg Oral QHS  . hydrALAZINE      . labetalol      . levothyroxine  25 mcg Oral Q0600  . montelukast  10 mg Oral Daily  . mupirocin ointment  1 application Nasal BID  . pantoprazole  40 mg Oral Daily  . sodium chloride flush  3 mL Intravenous Q12H   Continuous Infusions: . sodium chloride    . sodium chloride    .  ceFAZolin (ANCEF) IV 2 g (01/04/19 1803)  . lactated ringers 10 mL/hr at 01/04/19 1444  . magnesium sulfate 1 - 4 g bolus IVPB       LOS: 5 days     Vernell Leep, MD, FACP, [facility]. [facility]  To contact the attending provider between 7A-7P or the covering provider during after hours 7P-7A, please log into the web site [email or web address] and access using universal [place] password for that web site. If you do not have the password, please call the hospital operator.  01/04/2019, 6:49 PM

## 2019-01-04 NOTE — Telephone Encounter (Signed)
-----   Message from Ulyses Amor, [place] sent at 01/04/2019 12:32 PM EST -----  S/p left CEA by Dr. Trula Slade f/u in 2-3 weeks

## 2019-01-04 NOTE — Transfer of Care (Signed)
Immediate Anesthesia Transfer of Care Note  Patient: Madison Medina  Procedure(s) Performed: ENDARTERECTOMY CAROTID LEFT (Left Neck) PATCH ANGIOPLASTY USING XENOSURE BIOLOGIC PATCH (Left Neck)  Patient Location: PACU  Anesthesia Type:General  Level of Consciousness: sedated and patient cooperative  Airway & Oxygen Therapy: Patient Spontanous Breathing and Patient connected to nasal cannula oxygen  Post-op Assessment: Report given to RN and Post -op Vital signs reviewed and stable  Post vital signs: Reviewed and stable  Last Vitals:  Vitals Value Taken Time  BP 156/78 01/04/2019 12:40 PM  Temp    Pulse 75 01/04/2019 12:45 PM  Resp 16 01/04/2019 12:45 PM  SpO2 96 % 01/04/2019 12:45 PM  Vitals shown include unvalidated device data.  Last Pain:  Vitals:   01/04/19 0812  TempSrc: Oral  PainSc: 0-No pain         Complications: No apparent anesthesia complications

## 2019-01-04 NOTE — Progress Notes (Signed)
OT Cancellation Note  Patient Details Name: Madison Medina MRN: [id number] DOB: 01-Nov-1961   Cancelled Treatment:    Reason Eval/Treat Not Completed: Patient at procedure or test/ unavailable; will follow up for OT treatment as schedule allows.  Lou Cal, OT [facility] Pager [phone] Office [phone]   Raymondo Band 01/04/2019, 9:40 AM

## 2019-01-04 NOTE — Anesthesia Postprocedure Evaluation (Signed)
Anesthesia Post Note  Patient: KIMIAH HIBNER  Procedure(s) Performed: ENDARTERECTOMY CAROTID LEFT (Left Neck) PATCH ANGIOPLASTY USING XENOSURE BIOLOGIC PATCH (Left Neck)     Patient location during evaluation: PACU Anesthesia Type: General Level of consciousness: awake and alert Pain management: pain level controlled Vital Signs Assessment: post-procedure vital signs reviewed and stable Respiratory status: spontaneous breathing, nonlabored ventilation, respiratory function stable and patient connected to nasal cannula oxygen Cardiovascular status: blood pressure returned to baseline and stable Postop Assessment: no apparent nausea or vomiting Anesthetic complications: no    Last Vitals:  Vitals:   01/04/19 1351 01/04/19 1405  BP: (!) 154/65 124/60  Pulse: 81 75  Resp: 18 18  Temp:  (!) 36.3 C  SpO2: 97% 97%    Last Pain:  Vitals:   01/04/19 1351  TempSrc:   PainSc: 0-No pain                 Audry Pili

## 2019-01-05 ENCOUNTER — Encounter (HOSPITAL_COMMUNITY): Payer: Self-pay | Admitting: Surgery

## 2019-01-05 LAB — CBC
HCT: 36.1 % (ref 36.0–46.0)
Hemoglobin: 11.7 g/dL — ABNORMAL LOW (ref 12.0–15.0)
MCH: 30.4 pg (ref 26.0–34.0)
MCHC: 32.4 g/dL (ref 30.0–36.0)
MCV: 93.8 fL (ref 80.0–100.0)
Platelets: 220 10*3/uL (ref 150–400)
RBC: 3.85 MIL/uL — ABNORMAL LOW (ref 3.87–5.11)
RDW: 14.9 % (ref 11.5–15.5)
WBC: 14.8 10*3/uL — ABNORMAL HIGH (ref 4.0–10.5)
nRBC: 0 % (ref 0.0–0.2)

## 2019-01-05 LAB — BASIC METABOLIC PANEL
Anion gap: 9 (ref 5–15)
BUN: 13 mg/dL (ref 6–20)
CO2: 24 mmol/L (ref 22–32)
Calcium: 8.2 mg/dL — ABNORMAL LOW (ref 8.9–10.3)
Chloride: 99 mmol/L (ref 98–111)
Creatinine, Ser: 0.75 mg/dL (ref 0.44–1.00)
Glucose, Bld: 96 mg/dL (ref 70–99)
Potassium: 4.5 mmol/L (ref 3.5–5.1)
Sodium: 132 mmol/L — ABNORMAL LOW (ref 135–145)

## 2019-01-05 MED ORDER — ASPIRIN 325 MG PO TBEC: 325.0000 mg | DELAYED_RELEASE_TABLET | Freq: Every day | ORAL | 0 refills | Status: DC

## 2019-01-05 MED ORDER — DOCUSATE SODIUM 100 MG PO CAPS: 100.0000 mg | ORAL_CAPSULE | Freq: Every day | ORAL | 0 refills | Status: DC

## 2019-01-05 NOTE — Progress Notes (Signed)
Pt discharged home with husband.  And instructions and follow up appointments reviewed, all questions answered.

## 2019-01-05 NOTE — Progress Notes (Signed)
Physical Therapy Treatment Patient Details Name: ANGI GOODELL MRN: [id number] DOB: 1961-01-07 Today's Date: 01/05/2019    History of Present Illness TANICKA BISAILLON is an 58 y.o. female  With PMH HTN, MI, HLD, COPD, asthma, hyperthyroidism (had radiation now hypo) presented to [facility] ED for right arm numbness and weakness. 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 New abnormal appearance of the left ICA. Breast biopsy 12/28/2018 with family reporting positive for cancer. S/p L CEA 1/22.    PT Comments    Pt re-assessed s/p L CEA 1/22. Noted slightly decreased right shoulder flexion strength, but otherwise pt strength equal and WFL. Patient is progressing very well towards their physical therapy goals. Ambulating hallway distances with no assistive device and supervision. Continues with mild dynamic balance deficits in addition to cognitive impairments (decreased recall, awareness, problem solving). Updated discharge plan to OPPT.    Follow Up Recommendations  Outpatient PT (neuro);Supervision for mobility/OOB     Equipment Recommendations  None recommended by PT    Recommendations for Other Services       Precautions / Restrictions Precautions Precautions: Fall Precaution Comments: place gait belt above breasts (Bil biopsies 12/28/2018 at under side and lower part of breasts) Restrictions Weight Bearing Restrictions: No    Mobility  Bed Mobility               General bed mobility comments: Sitting EOB on arrival  Transfers Overall transfer level: Needs assistance Equipment used: None Transfers: Sit to/from Stand Sit to Stand: Supervision            Ambulation/Gait Ambulation/Gait assistance: Supervision Gait Distance (Feet): 250 Feet Assistive device: None Gait Pattern/deviations: Step-through pattern;Wide base of support     General Gait Details: Pt utilizing wide BOS but no overt LOB noted  through. Cues for reciprocal arm swing. able to perform stops/starts, turns, and step over obstacles without gross imbalance   Stairs             Wheelchair Mobility    Modified Rankin (Stroke Patients Only)       Balance Overall balance assessment: Needs assistance   Sitting balance-Leahy Scale: Good     Standing balance support: During functional activity Standing balance-Leahy Scale: Good                              Cognition Arousal/Alertness: Awake/alert Behavior During Therapy: WFL for tasks assessed/performed Overall Cognitive Status: Impaired/Different from baseline Area of Impairment: Memory;Safety/judgement                     Memory: Decreased short-term memory   Safety/Judgement: Decreased awareness of deficits   Problem Solving: Decreased initiation        Exercises      General Comments        Pertinent Vitals/Pain Pain Assessment: No/denies pain    Home Living                      Prior Function            PT Goals (current goals can now be found in the care plan section) Acute Rehab PT Goals Patient Stated Goal: to go home Potential to Achieve Goals: Good Progress towards PT goals: Progressing toward goals    Frequency    Min 4X/week      PT Plan Discharge plan needs to be  updated    Co-evaluation              AM-PAC PT "6 Clicks" Mobility   Outcome Measure  Help needed turning from your back to your side while in a flat bed without using bedrails?: None Help needed moving from lying on your back to sitting on the side of a flat bed without using bedrails?: None Help needed moving to and from a bed to a chair (including a wheelchair)?: None Help needed standing up from a chair using your arms (e.g., wheelchair or bedside chair)?: None Help needed to walk in hospital room?: None Help needed climbing 3-5 steps with a railing? : A Little 6 Click Score: 23    End of Session  Equipment Utilized During Treatment: Gait belt Activity Tolerance: Patient tolerated treatment well Patient left: in bed;with call bell/phone within reach;with bed alarm set;with family/visitor present Nurse Communication: Mobility status PT Visit Diagnosis: Other abnormalities of gait and mobility (R26.89);Other symptoms and signs involving the nervous system (R29.898)     Time: 3295-1884 PT Time Calculation (min) (ACUTE ONLY): 14 min  Charges:  $Neuromuscular Re-education: 8-22 mins                     Ellamae Sia, PT, DPT Acute Rehabilitation Services Pager [phone] Office [phone]    Willy Eddy 01/05/2019, 3:52 PM

## 2019-01-05 NOTE — Progress Notes (Signed)
Pt placed on CPAP for the night.  Tolerating well with full face mask.   

## 2019-01-05 NOTE — Progress Notes (Addendum)
[facility]  Subjective  - Doing well without new complains other than incisional discomfort.   Objective (!) 130/58 65 97.8 F (36.6 C) (Oral) 16 98%  Intake/Output Summary (Last 24 hours) at 01/05/2019 0806 Last data filed at 01/05/2019 0500 Gross per 24 hour  Intake 1391.84 ml  Output 1010 ml  Net 381.84 ml    Left neck incision without hematoma.  JP OP 10 cc. Moving all 4 ext., no tongue deviation and smile is symmetric Heart RRR Lungs non labored breathing    Assessment/Planning: POD # 1 left CEA  JP drain OP 10 cc will have nursing d/c prior to discharge home Stable disposition for discharge today F/U with DR. Artem Bunte in 2-3 weeks  Roxy Horseman 01/05/2019 8:06 AM --  Laboratory Lab Results: Recent Labs    01/04/19 1613 01/05/19 0445  WBC 13.8* 14.8*  HGB 11.7* 11.7*  HCT 36.2 36.1  PLT 240 220   BMET Recent Labs    01/04/19 1613 01/05/19 0445  NA 136 132*  K 4.1 4.5  CL 104 99  CO2 24 24  GLUCOSE 156* 96  BUN 13 13  CREATININE 0.97 0.75  CALCIUM 8.1* 8.2*    COAG Lab Results  Component Value Date   INR 1.05 01/04/2019   INR 1.02 12/30/2018   INR 0.94 11/14/2009   No results found for: PTT  Agree with the above.  POD#1 Neuro at baseline with right arm weakness Incision soft Mnimal drain output.  Will d/c drain OK to d/c from my perspective  Wells Treyveon Mochizuki

## 2019-01-05 NOTE — Care Management Important Message (Signed)
Important Message  Patient Details  Name: Madison Medina MRN: [id number] Date of Birth: [birth year]   Medicare Important Message Given:  Yes    Itzabella Sorrels P Ani Deoliveira 01/05/2019, 11:13 AM

## 2019-01-05 NOTE — Discharge Summary (Signed)
Physician Discharge Summary  Madison Medina RFF:[id number] DOB: July 10, 1961  PCP: Madison Roche, NP  Admit date: 12/30/2018 Discharge date: 01/05/2019  Recommendations for Outpatient Follow-up:  1. Madison Roche, NP/PCP in 1 week with repeat labs (CBC & BMP). 2. Dr. Antony Contras, Neurology in 6 weeks. 3. Dr. Harold Barban, Vascular surgery: Office will arrange follow-up. 4. Referral made for outpatient PT and OT.  Home Health: None Equipment/Devices: None  Discharge Condition: Improved and stable CODE STATUS: Full Diet recommendation: Heart healthy diet.  Discharge Diagnoses:  Principal Problem:   CVA (cerebral vascular accident) ([place]) Active Problems:   HTN (hypertension)   Anxiety   Hypothyroid   Familial combined hyperlipidemia   Tobacco use   COPD (chronic obstructive pulmonary disease) ([facility])   AKI (acute kidney injury) ([place])   Hyponatremia   CAD (coronary artery disease)   Brief Summary: 58 year old female with PMH of HTN, obesity, HLD, hypothyroid, COPD, tobacco abuse, transferred to [facility] from [facility] on 12/30/2018 for acute CVA.  The morning of January 15 she had a breast biopsy at [facility] and around that time she had acute onset of right upper extremity tingling and numbness and shortly thereafter developed right lower extremity weakness.  The next morning around 9 AM family noticed that patient was mumbling and had garbled speech but she herself was unaware of her speech and was able to understand what people said.  However later that day she became confused.  She was admitted for acute embolic stroke.  Neurology was consulted.  She completed stroke work-up.  Her stroke was felt to be due to severe carotid artery stenosis.  After cardiology performed cardiac cath and cleared her for procedure, she underwent carotid endarterectomy by vascular surgery on 01/04/2018.   Assessment & Plan:  Acute stroke Resultant aphasia and right hemiparesis Etiology: Embolic and  watershed strokes left hemisphere due to severe carotid artery stenosis at the carotid bulb and proximal ICA CT head: No hemorrhage, mild chronic cerebral white matter changes. MRI head: Patchy acute infarcts left cerebral hemisphere with involvement of the deep watershed zones. CTA head and neck: Left proximal ICA near occlusion with severe threadlike stenosis and mixed plaque.  Right proximal ICA severe 90% stenosis with mixed plaque. TTE LVEF 55% and grade 2 diastolic dysfunction.   LDL 183 A1c 5.7 Not on aspirin prior to admission, now on aspirin 325 mg daily. Neurology consulted and assisted with evaluation and management, recommended vascular surgery evaluation for bilateral severe carotid atherosclerosis and signed off 1/18. Therapies evaluated today and recommend outpatient PT. Outpatient neurology follow-up.  Carotid artery disease/symptomatic left carotid stenosis After cardiac clearance, vascular surgery performed left carotid endarterectomy with bovine pericardial patch angioplasty on 1/22. Drain was removed today and vascular surgery cleared her for discharge home.  Essential hypertension Allowed for permissive hypertension.    Resumed prior home meds (HCTZ, lisinopril and amlodipine) at discharge.  Hyperlipidemia LDL 183, goal <70.  Started atorvastatin 80 mg daily.  Glucose intolerance/prediabetes A1c 5.7, goal <7.  Hypothyroid Continue Synthroid-on high doses/225 mcg.  Clinically euthyroid.  Follow-up TSH if not done recently as outpatient.  Tobacco abuse Cessation counseled.  Morbid obesity/Body mass index is 46.05 kg/m.  Medication noncompliance Counseled regarding importance of compliance with all aspects of her medical care and she verbalized understanding.  CAD Cardiology was consulted.  Coronary CTA was initially done which showed concern for LAD stenosis.  Thereby patient underwent cardiac cath.  Status post cardiac cath this admission which showed  mild  to moderate CAD.  Medical management.  Cardiology signed off and advised follow-up with them as needed.  COPD Stable without clinical bronchospasm.  Recently diagnosed breast cancer Outpatient follow-up  GERD Continue Pepcid and PPI.  Anxiety and depression Continue Celexa.  Anemia Stable.      Consultants:  Neurology Vascular surgery Cardiology  Procedures:  Cardiac cath 1/21 Left CEA 1/22   Discharge Instructions  Discharge Instructions    Ambulatory referral to Neurology   Complete by:  As directed    An appointment is requested in approximately: 6 wks   Call MD for:   Complete by:  As directed    Recurrent or worsening strokelike symptoms.   Call MD for:  difficulty breathing, headache or visual disturbances   Complete by:  As directed    Call MD for:  extreme fatigue   Complete by:  As directed    Call MD for:  persistant dizziness or light-headedness   Complete by:  As directed    Call MD for:  persistant nausea and vomiting   Complete by:  As directed    Call MD for:  redness, tenderness, or signs of infection (pain, swelling, redness, odor or green/yellow discharge around incision site)   Complete by:  As directed    Call MD for:  severe uncontrolled pain   Complete by:  As directed    Call MD for:  temperature >100.4   Complete by:  As directed    Diet - low sodium heart healthy   Complete by:  As directed    Increase activity slowly   Complete by:  As directed        Medication List    STOP taking these medications   celecoxib 200 MG capsule Commonly known as:  CELEBREX     TAKE these medications   acetaminophen 325 MG tablet Commonly known as:  TYLENOL Take 650 mg by mouth every 6 (six) hours as needed for mild pain, fever or headache.   amLODipine 5 MG tablet Commonly known as:  NORVASC Take 5 mg by mouth daily.   aspirin 325 MG EC tablet Take 1 tablet (325 mg total) by mouth daily. Start taking on:  January 06, 2019   atorvastatin 80 MG tablet Commonly known as:  LIPITOR Take 80 mg by mouth daily at 6 PM.   citalopram 20 MG tablet Commonly known as:  CELEXA Take 20 mg by mouth daily.   docusate sodium 100 MG capsule Commonly known as:  COLACE Take 1 capsule (100 mg total) by mouth daily. Start taking on:  January 06, 2019   famotidine 40 MG tablet Commonly known as:  PEPCID Take 40 mg by mouth at bedtime.   hydrochlorothiazide 25 MG tablet Commonly known as:  HYDRODIURIL Take 25 mg by mouth daily.   levothyroxine 200 MCG tablet Commonly known as:  SYNTHROID, LEVOTHROID Take 200 mcg by mouth daily before breakfast. (in addition to the 91mcg tablet)   levothyroxine 25 MCG tablet Commonly known as:  SYNTHROID, LEVOTHROID Take 25 mcg by mouth daily before breakfast. (in addition to the 21mcg tablet)   lisinopril 20 MG tablet Commonly known as:  PRINIVIL,ZESTRIL Take 20 mg by mouth 2 (two) times daily.   montelukast 10 MG tablet Commonly known as:  SINGULAIR Take 10 mg by mouth at bedtime.   omeprazole 40 MG capsule Commonly known as:  PRILOSEC Take 40 mg by mouth daily.   oxyCODONE-acetaminophen 7.5-325 MG tablet Commonly known as:  PERCOCET Take 1 tablet by mouth 3 (three) times daily.      Follow-up Information    Garvin Fila, MD Follow up in 6 week(s).   Specialties:  Neurology, Radiology Why:  Office will call you with appointment Contact information: [place] [address] [phone]        Serafina Mitchell, MD In 2 weeks.   Specialties:  Vascular Surgery, Cardiology Why:  Office will call you to arrange your appt (sent) Contact information: [place] [address] [phone]        [facility] Follow up.   Specialty:  Rehabilitation Why:  Referral made for outpt PT/OT- they will f/u with you once referral processed  Contact information: [place] [address] [phone]       Madison Roche, NP. Schedule an appointment as soon as possible for a visit in 1 week(s).   Specialty:  Gerontology Why:  To be seen with repeat labs (CBC & BMP). Contact information: [place] [address] [phone]          Not on File    Procedures/Studies: Ct Angio Head W Or Wo Contrast  Result Date: 12/31/2018 CLINICAL DATA:  57 year old female with scattered acute infarcts in the left hemisphere on brain MRI 2 days ago and evidence of occlusion or poor flow in the left ICA siphon on that exam. Left ICA occlusion suspected on Doppler ultrasound that day. EXAM: CT ANGIOGRAPHY HEAD AND NECK TECHNIQUE: Multidetector CT imaging of the head and neck was performed using the standard protocol during bolus administration of intravenous contrast. Multiplanar CT image reconstructions and MIPs were obtained to evaluate the vascular anatomy. Carotid stenosis measurements (when applicable) are obtained utilizing NASCET criteria, using the distal internal carotid diameter as the denominator. CONTRAST:  20mL ISOVUE-370 IOPAMIDOL (ISOVUE-370) INJECTION 76% COMPARISON:  Doppler ultrasound 23-Jan-2019. Brain MRI 23-Jan-2019. head CT 2019/01/23. FINDINGS: CT HEAD Brain: No midline shift, mass effect, or evidence of intracranial mass lesion. No acute intracranial hemorrhage identified. Scattered cortical and white matter hypodensity in the left hemisphere which is more pronounced from the 2019/01/23 CT and corresponds to areas of restricted diffusion by MRI. Stable gray-white matter differentiation elsewhere. Calvarium and skull base: No acute osseous abnormality identified. Paranasal sinuses: Visualized paranasal sinuses and mastoids are stable and well pneumatized. Orbits: Negative orbits and scalp soft tissues. CTA NECK Skeleton: Absent dentition. No acute osseous abnormality identified. Upper chest: Large  body habitus. Negative lung apices and visible superior mediastinum. Other neck: Partially retropharyngeal course of the left carotid, normal variant. Otherwise negative; no neck mass or lymphadenopathy. Aortic arch: 4 vessel arch configuration, the left vertebral artery arises directly from the arch. Soft and calcified arch and proximal great vessel atherosclerosis. Right carotid system: No brachiocephalic artery stenosis despite plaque. Soft plaque in the proximal right CCA without stenosis. At the right carotid bifurcation there is complex soft and calcified plaque resulting in a short segment high-grade stenosis on series 9, image 151 and series 12, image 134. Numerically this is estimated at 70%. Plaque continues to the distal right ICA bulb. The vessel remains patent with no additional stenosis below the skull base. Left carotid system: Soft and calcified plaque at the left CCA origin resulting in less than 50% stenosis with respect to the distal vessel. Widespread low-density plaque in the medial left CCA in the neck. Diffuse vessel irregularity to the bifurcation.  At the left ICA origin and bulb there are tandem radiographic string sign stenoses (series 12, images 170 and 172) and near occlusion of the vessel. However, it does remain patent with preserved enhancement to the skull base. There is calcified plaque below the skull base which is not hemodynamically significant. Vertebral arteries: No proximal right subclavian artery origin despite atherosclerosis. Soft and calcified plaque at the left vertebral artery origin results in high-grade stenosis, near occlusion. The right vertebral is non dominant and diminutive but remains patent to the skull base. The left vertebral arises directly from the arch with soft and calcified plaque at its origin resulting in moderate stenosis. The left vertebral is dominant and has a late entry into the cervical transverse foramen. It is patent to the skull base without  additional stenosis. CTA HEAD Posterior circulation: Diminutive right vertebral artery functionally terminates in PICA. The left vertebral supplies the basilar. There is left V4 calcified plaque resulting in moderate to severea stenosis (series 9, images 202 and 205. The basilar is patent but irregular with faint calcified plaque. SCA and left PCA origins are patent and within normal limits. Fetal type right PCA. Left posterior communicating artery diminutive or absent. Left PCA branches are mildly irregular. Right PCA branches are mildly to moderately irregular. Anterior circulation: The left ICA siphon is patent but with perhaps mildly asymmetric decreased enhancement compared to the right. Left siphon calcified plaque is moderate. And there is moderate stenosis in the cavernous segment. Normal left posterior communicating and ophthalmic artery origins. The supraclinoid left ICA and left ICA terminus are diminutive but patent. Contralateral right ICA siphon is patent with moderate plaque but only mild stenosis. Patent right ICA terminus. Normal MCA origins. Normal right ACA origin. The left A1 appears non dominant but there is moderate to severe stenosis at its origin. Anterior communicating artery is present. Bilateral ACA branches are patent with mild irregularity. The left A2 is dominant. Left MCA M1 segment and bifurcation are patent with mild irregularity. No left MCA branch occlusion is identified. Right MCA M1 segment and bifurcation are patent. No right MCA branch occlusion identified. Venous sinuses: Patent on the delayed images. Anatomic variants: Dominant left vertebral artery which arises directly from the arch and supplies the basilar. Fetal type right PCA origin. Delayed phase: No abnormal enhancement identified. Review of the MIP images confirms the above findings IMPRESSION: 1. The Left ICA is patent despite tandem RADIOGRAPHIC STRING SIGN stenoses and near occlusion at its origin. Superimposed  moderate Left ICA siphon stenosis due to bulky calcified plaque. No Left MCA branch occlusion identified. 2. Severely age advanced atherosclerosis in the head and neck. Other hemodynamically significant stenoses: - Right ICA origin severe (70%). - dominant Left Vertebral Artery origin (moderate) and V4 segment (moderate to severe). Note that this is the Basilar Artery supply. - Left ACA origin (moderate to severe). - non dominant Right Vertebral Artery origin (severe, near occlusion). 3. Expected CT appearance of multifocal left hemisphere infarcts. No hemorrhage or mass effect. Electronically Signed   By: Genevie Ann M.D.   On: 12/31/2018 15:05   Ct Angio Neck W Or Wo Contrast  Result Date: 12/31/2018 CLINICAL DATA:  58 year old female with scattered acute infarcts in the left hemisphere on brain MRI 2 days ago and evidence of occlusion or poor flow in the left ICA siphon on that exam. Left ICA occlusion suspected on Doppler ultrasound that day. EXAM: CT ANGIOGRAPHY HEAD AND NECK TECHNIQUE: Multidetector CT imaging of the head and  neck was performed using the standard protocol during bolus administration of intravenous contrast. Multiplanar CT image reconstructions and MIPs were obtained to evaluate the vascular anatomy. Carotid stenosis measurements (when applicable) are obtained utilizing NASCET criteria, using the distal internal carotid diameter as the denominator. CONTRAST:  64mL ISOVUE-370 IOPAMIDOL (ISOVUE-370) INJECTION 76% COMPARISON:  Doppler ultrasound 01/21/2019. Brain MRI January 21, 2019. head CT 2019-01-21. FINDINGS: CT HEAD Brain: No midline shift, mass effect, or evidence of intracranial mass lesion. No acute intracranial hemorrhage identified. Scattered cortical and white matter hypodensity in the left hemisphere which is more pronounced from the 01-21-19 CT and corresponds to areas of restricted diffusion by MRI. Stable gray-white matter differentiation elsewhere. Calvarium and skull base: No acute  osseous abnormality identified. Paranasal sinuses: Visualized paranasal sinuses and mastoids are stable and well pneumatized. Orbits: Negative orbits and scalp soft tissues. CTA NECK Skeleton: Absent dentition. No acute osseous abnormality identified. Upper chest: Large body habitus. Negative lung apices and visible superior mediastinum. Other neck: Partially retropharyngeal course of the left carotid, normal variant. Otherwise negative; no neck mass or lymphadenopathy. Aortic arch: 4 vessel arch configuration, the left vertebral artery arises directly from the arch. Soft and calcified arch and proximal great vessel atherosclerosis. Right carotid system: No brachiocephalic artery stenosis despite plaque. Soft plaque in the proximal right CCA without stenosis. At the right carotid bifurcation there is complex soft and calcified plaque resulting in a short segment high-grade stenosis on series 9, image 151 and series 12, image 134. Numerically this is estimated at 70%. Plaque continues to the distal right ICA bulb. The vessel remains patent with no additional stenosis below the skull base. Left carotid system: Soft and calcified plaque at the left CCA origin resulting in less than 50% stenosis with respect to the distal vessel. Widespread low-density plaque in the medial left CCA in the neck. Diffuse vessel irregularity to the bifurcation. At the left ICA origin and bulb there are tandem radiographic string sign stenoses (series 12, images 170 and 172) and near occlusion of the vessel. However, it does remain patent with preserved enhancement to the skull base. There is calcified plaque below the skull base which is not hemodynamically significant. Vertebral arteries: No proximal right subclavian artery origin despite atherosclerosis. Soft and calcified plaque at the left vertebral artery origin results in high-grade stenosis, near occlusion. The right vertebral is non dominant and diminutive but remains patent to the  skull base. The left vertebral arises directly from the arch with soft and calcified plaque at its origin resulting in moderate stenosis. The left vertebral is dominant and has a late entry into the cervical transverse foramen. It is patent to the skull base without additional stenosis. CTA HEAD Posterior circulation: Diminutive right vertebral artery functionally terminates in PICA. The left vertebral supplies the basilar. There is left V4 calcified plaque resulting in moderate to severea stenosis (series 9, images 202 and 205. The basilar is patent but irregular with faint calcified plaque. SCA and left PCA origins are patent and within normal limits. Fetal type right PCA. Left posterior communicating artery diminutive or absent. Left PCA branches are mildly irregular. Right PCA branches are mildly to moderately irregular. Anterior circulation: The left ICA siphon is patent but with perhaps mildly asymmetric decreased enhancement compared to the right. Left siphon calcified plaque is moderate. And there is moderate stenosis in the cavernous segment. Normal left posterior communicating and ophthalmic artery origins. The supraclinoid left ICA and left ICA terminus are diminutive but patent. Contralateral right ICA  siphon is patent with moderate plaque but only mild stenosis. Patent right ICA terminus. Normal MCA origins. Normal right ACA origin. The left A1 appears non dominant but there is moderate to severe stenosis at its origin. Anterior communicating artery is present. Bilateral ACA branches are patent with mild irregularity. The left A2 is dominant. Left MCA M1 segment and bifurcation are patent with mild irregularity. No left MCA branch occlusion is identified. Right MCA M1 segment and bifurcation are patent. No right MCA branch occlusion identified. Venous sinuses: Patent on the delayed images. Anatomic variants: Dominant left vertebral artery which arises directly from the arch and supplies the basilar.  Fetal type right PCA origin. Delayed phase: No abnormal enhancement identified. Review of the MIP images confirms the above findings IMPRESSION: 1. The Left ICA is patent despite tandem RADIOGRAPHIC STRING SIGN stenoses and near occlusion at its origin. Superimposed moderate Left ICA siphon stenosis due to bulky calcified plaque. No Left MCA branch occlusion identified. 2. Severely age advanced atherosclerosis in the head and neck. Other hemodynamically significant stenoses: - Right ICA origin severe (70%). - dominant Left Vertebral Artery origin (moderate) and V4 segment (moderate to severe). Note that this is the Basilar Artery supply. - Left ACA origin (moderate to severe). - non dominant Right Vertebral Artery origin (severe, near occlusion). 3. Expected CT appearance of multifocal left hemisphere infarcts. No hemorrhage or mass effect. Electronically Signed   By: Genevie Ann M.D.   On: 12/31/2018 15:05   Ct Coronary Morph W/cta Cor W/score W/ca W/cm &/or Wo/cm  Addendum Date: 01/02/2019   ADDENDUM REPORT: 01/02/2019 17:53 CLINICAL DATA:  58 year old female with h/o carotid disease, morbid obesity and DOE. EXAM: Cardiac/Coronary  CT TECHNIQUE: The patient was scanned on a [employer]. FINDINGS: A 120 kV prospective scan was triggered in the descending thoracic aorta at 111 HU's. Axial non-contrast 3 mm slices were carried out through the heart. The data set was analyzed on a dedicated work station and scored using the [place]. Gantry rotation speed was 250 msecs and collimation was .6 mm. No beta blockade and 0.8 mg of sl NTG was given. The 3D data set was reconstructed in 5% intervals of the 67-82 % of the R-R cycle. Diastolic phases were analyzed on a dedicated work station using MPR, MIP and VRT modes. The patient received 80 cc of contrast. Aorta: Normal size. Mild diffuse atherosclerotic plaque and calcifications. No dissection. Aortic Valve:  Trileaflet.  No calcifications. Coronary  Arteries:  Normal coronary origin.  Right dominance. RCA is a large dominant artery that gives rise to PDA and PLA. There is moderate diffuse mixed plaque with focal stenoses 50-60% in the mid and distal RCA, proximal PLA and mid PDA. Left main is a large artery that gives rise to LAD, a ramus intermedius and LCX arteries. Left main has no obvious plaque. LAD is a large vessel that gives rise to two small diagonal arteries. Proximal LAD has long diffuse moderate calcified plaque with stenosis 50-69%. This is followed by a non-calcified plaque in the mid LAD with suspicion for stenosis > 70%. Distal LAD has only mild plaque. D1,2 are small with mild plaque. Ramus intermedius is a medium size artery with moderate calcified plaque in the proximal portion with stenosis 50-69%. LCX is a small caliber non-dominant artery that has mild diffuse plaque. Other findings: Normal pulmonary vein drainage into the left atrium. Normal let atrial appendage without a thrombus. Normal size of the pulmonary artery. IMPRESSION: 1. Coronary  calcium score of 719. This was 52 percentile for age and sex matched control. 2. Normal coronary origin with right dominance. 3. Moderate to severe CAD with moderate stenosis in the mid and distal RCA, proximal PLA, mid PDA, proximal LAD and suspicion for severe stenosis in the mid LAD. Additional analysis with CT FFR will be submitted. Electronically Signed   By: Ena Dawley   On: 01/02/2019 17:53   Result Date: 01/02/2019 EXAM: OVER-READ INTERPRETATION  CT CHEST The following report is an over-read performed by radiologist Dr. Abigail Miyamoto of [facility], [place] on 01/02/2019. This over-read does not include interpretation of cardiac or coronary anatomy or pathology. The coronary CTA interpretation by the cardiologist is attached. COMPARISON:  Chest radiograph 12/29/2018. Complete chest CT of 06/04/2011. FINDINGS: Vascular: Aortic atherosclerosis. No central pulmonary embolism, on this  non-dedicated study. Mediastinum/Nodes: No imaged thoracic adenopathy. Lungs/Pleura: No imaged pleural fluid. Probable scarring at the left lung base. An area of nodularity including image 48/14 is similar (given differences in technique) to the 06/04/2011 exam and can be presumed benign. Upper Abdomen: Normal imaged portions of the liver, spleen, stomach. Musculoskeletal: No acute osseous abnormality. IMPRESSION: 1.  No acute findings in the imaged extracardiac chest. 2.  Aortic Atherosclerosis (ICD10-I70.0). Electronically Signed: By: Abigail Miyamoto M.D. On: 01/02/2019 14:50   Ct Coronary Fractional Flow Reserve Data Prep  Result Date: 01/03/2019 EXAM: CT FFR ANALYSIS CLINICAL DATA:  58 year old obese patient with carotid disease and DOE. FINDINGS: FFRct analysis was performed on the original cardiac CT angiogram dataset. Diagrammatic representation of the FFRct analysis is provided in a separate PDF document in PACS. This dictation was created using the PDF document and an interactive 3D model of the results. 3D model is not available in the EMR/PACS. Normal FFR range is >0.80. 1. Left Main:  No significant stenosis. 2. LAD: Proximal: 0.91, mid 0.81, distal: 0.8. 3. LCX: No significant stenosis. 4. RCA: No significant stenosis. IMPRESSION: 1. CT FFR showed borderline stenosis in the proximal to mid LAD. Given that is it proximal LAD a cardiac catheterization is recommended. Electronically Signed   By: Ena Dawley   On: 01/03/2019 09:12   Mm Clip Placement Left  Result Date: 12/28/2018 CLINICAL DATA:  Post ultrasound-guided biopsy of a mass in the right breast at the 9:30 position. EXAM: DIAGNOSTIC BILATERAL MAMMOGRAM POST RIGHT BREAST ULTRASOUND BIOPSY AND LEFT BREAST STEREOTACTIC BIOPSIES COMPARISON:  Previous exam(s). FINDINGS: Note that the post biopsy mammograms were performed with patient sitting down due to syncopal episodes during the biopsy. Mammographic images were obtained following  ultrasound guided biopsy of the mass in the right breast at the 9:30 position. A heart shaped biopsy marking clip is present at the site of the biopsied mass in the right breast at the 9:30 position. Mammographic images were obtained following stereotactic guided biopsy of distortion in the upper outer anterior/periareolar left breast and stereotactic guided biopsy of a small mass in the upper-outer posterior left breast. A coil shaped biopsy marking clip is present in appears to be located adjacent to the biopsied distortion in the upper outer anterior/periareolar left breast. An X shaped biopsy marking clip is present at the site of the biopsied mass in the upper-outer posterior left breast. IMPRESSION: 1. Coil shaped biopsy marking clip located adjacent to the biopsied distortion in the upper outer anterior/periareolar left breast. 2. X shaped biopsy marking clip at site of biopsied mass in the upper-outer posterior left breast. Final Assessment: Post Procedure Mammograms for Marker  Placement Electronically Signed   By: Everlean Alstrom M.D.   On: 12/28/2018 09:00     Subjective: Patient reports that she is feeling better.  Denies complaints.  No pain reported.  Right sided strength improving.  As per RN, no acute issues noted.  Discharge Exam:  Vitals:   01/05/19 1100 01/05/19 1300 01/05/19 1500 01/05/19 1700  BP:      Pulse: (!) 54 (!) 56 (!) 52 (!) 58  Resp: 17 17 18    Temp:      TempSrc:      SpO2: 98% 97% 94% 94%  Weight:      Height:      BP ranging between 143/63- 189/64.  Afebrile.  General exam: Pleasant middle-aged female, moderately built and morbidly obese sitting up comfortably in chair without distress. Respiratory system: Clear to auscultation. Respiratory effort normal. Cardiovascular system: S1 & S2 heard, RRR. No JVD, murmurs, rubs, gallops or clicks. No pedal edema.    Telemetry personally reviewed: SB in the 50s-SR. Gastrointestinal system: Abdomen is nondistended, soft  and nontender. No organomegaly or masses felt. Normal bowel sounds heard. Central nervous system: Alert and oriented x3.  No facial asymmetry, dysarthria noted at this time.?  Mild expressive aphasia. Extremities: Right limbs grade 4 x 5 power.  Left limbs grade 5 x 5 power. Skin: No rashes, lesions or ulcers Psychiatry: Judgement and insight appear normal. Mood & affect appropriate. Neck: Left CEA site without bleeding or acute findings.     The results of significant diagnostics from this hospitalization (including imaging, microbiology, ancillary and laboratory) are listed below for reference.     Microbiology: Recent Results (from the past 240 hour(s))  Surgical PCR screen     Status: Abnormal   Collection Time: 01/03/19 10:35 PM  Result Value Ref Range Status   MRSA, PCR NEGATIVE NEGATIVE Final   Staphylococcus aureus POSITIVE (A) NEGATIVE Final    Comment: (NOTE) The Xpert SA Assay (FDA approved for NASAL specimens in patients 45 years of age and older), is one component of a comprehensive surveillance program. It is not intended to diagnose infection nor to guide or monitor treatment. Performed at [facility], [place] [address], [place], [place] [address]      Labs: CBC: Recent Labs  Lab 12/30/18 1539  01/01/19 0708 01/02/19 0535 01/03/19 0501 01/04/19 1613 01/05/19 0445  WBC 10.2   < > 8.8 8.7 9.0 13.8* 14.8*  NEUTROABS 7.5  --   --   --   --  12.6*  --   HGB 13.2   < > 12.0 12.0 11.0* 11.7* 11.7*  HCT 41.0   < > 37.2 36.5 35.3* 36.2 36.1  MCV 92.3   < > 92.8 94.8 95.9 92.8 93.8  PLT 249   < > 234 240 204 240 220   < > = values in this interval not displayed.   Basic Metabolic Panel: Recent Labs  Lab 01/01/19 0708 01/02/19 0535 01/03/19 0501 01/04/19 1613 01/05/19 0445  NA 134* 135 135 136 132*  K 3.8 4.3 4.2 4.1 4.5  CL 101 104 105 104 99  CO2 22 22 24 24 24   GLUCOSE 91 96 92 156* 96  BUN 14 12 13 13 13   CREATININE 0.78 0.83 0.82 0.97 0.75   CALCIUM 7.9* 8.0* 7.8* 8.1* 8.2*  MG  --   --   --  1.7  --   PHOS  --   --   --  3.4  --  Liver Function Tests: Recent Labs  Lab 12/30/18 1539 01/04/19 1613  AST 24 22  ALT 21 22  ALKPHOS 54 57  BILITOT 0.6 0.8  PROT 7.1 6.1*  ALBUMIN 3.4* 2.9*   CBG: Recent Labs  Lab 12/30/18 1155  GLUCAP 107*     Time coordinating discharge: 50 minutes  SIGNED:  Vernell Leep, MD, FACP, [facility]. [facility]  To contact the attending provider between 7A-7P or the covering provider during after hours 7P-7A, please log into the web site [email or web address] and access using universal [place] password for that web site. If you do not have the password, please call the hospital operator.

## 2019-01-05 NOTE — Discharge Instructions (Signed)
° °[facility] ° °Discharge Instructions °  °Carotid Endarterectomy (CEA) ° °Please refer to the following instructions for your post-procedure care. Your surgeon or physician assistant will discuss any changes with you. ° °Activity ° °You are encouraged to walk as much as you can. You can slowly return to normal activities but must avoid strenuous activity and heavy lifting until your doctor tell you it's OK. Avoid activities such as vacuuming or swinging a golf club. You can drive after one week if you are comfortable and you are no longer taking prescription pain medications. It is normal to feel tired for serval weeks after your surgery. It is also normal to have difficulty with sleep habits, eating, and bowel movements after surgery. These will go away with time. ° °Bathing/Showering ° °You may shower after you come home. Do not soak in a bathtub, hot tub, or swim until the incision heals completely. ° °Incision Care ° °Shower every day. Clean your incision with mild soap and water. Pat the area dry with a clean towel. You do not need a bandage unless otherwise instructed. Do not apply any ointments or creams to your incision. You may have skin glue on your incision. Do not peel it off. It will come off on its own in about one week. Your incision may feel thickened and raised for several weeks after your surgery. This is normal and the skin will soften over time. For Men Only: It's OK to shave around the incision but do not shave the incision itself for 2 weeks. It is common to have numbness under your chin that could last for several months. ° °Diet ° °Resume your normal diet. There are no special food restrictions following this procedure. A low fat/low cholesterol diet is recommended for all patients with vascular disease. In order to heal from your surgery, it is CRITICAL to get adequate nutrition. Your body requires vitamins, minerals, and protein. Vegetables are the best  source of vitamins and minerals. Vegetables also provide the perfect balance of protein. Processed food has little nutritional value, so try to avoid this. ° ° ° ° ° ° ° °Medications ° °Resume taking all of your medications unless your doctor or physician assistant tells you not to. If your incision is causing pain, you may take over-the- counter pain relievers such as acetaminophen (Tylenol). If you were prescribed a stronger pain medication, please be aware these medications can cause nausea and constipation. Prevent nausea by taking the medication with a snack or meal. Avoid constipation by drinking plenty of fluids and eating foods with a high amount of fiber, such as fruits, vegetables, and grains. Do not take Tylenol if you are taking prescription pain medications. ° °Follow Up ° °Our office will schedule a follow up appointment 2-3 weeks following discharge. ° °Please call us immediately for any of the following conditions ° °Increased pain, redness, drainage (pus) from your incision site. °Fever of 101 degrees or higher. °If you should develop stroke (slurred speech, difficulty swallowing, weakness on one side of your body, loss of vision) you should call 911 and go to the nearest emergency room. ° °Reduce your risk of vascular disease: ° °Stop smoking. If you would like help call [facility] at 1-800-QUIT-NOW (1-800-784-8669) or [place] at 336-586-4000. °Manage your cholesterol °Maintain a desired weight °Control your diabetes °Keep your blood pressure down ° °If you have any questions, please call the office at [phone]. ° ° °Additional discharge instructions ° °Please get   your medications reviewed and adjusted by your Primary MD. ° °Please request your Primary MD to go over all Hospital Tests and Procedure/Radiological results at the follow up, please get all Hospital records sent to your Prim MD by signing hospital release before you go home. ° °If you had Pneumonia of Lung problems at the  Hospital: °Please get a 2 view Chest X ray done in 6-8 weeks after hospital discharge or sooner if instructed by your Primary MD. ° °If you have Congestive Heart Failure: °Please call your Cardiologist or Primary MD anytime you have any of the following symptoms:  °1) 3 pound weight gain in 24 hours or 5 pounds in 1 week  °2) shortness of breath, with or without a dry hacking cough  °3) swelling in the hands, feet or stomach  °4) if you have to sleep on extra pillows at night in order to breathe ° °Follow cardiac low salt diet and 1.5 lit/day fluid restriction. ° °If you have diabetes °Accuchecks 4 times/day, Once in AM empty stomach and then before each meal. °Log in all results and show them to your primary doctor at your next visit. °If any glucose reading is under 80 or above 300 call your primary MD immediately. ° °If you have Seizure/Convulsions/Epilepsy: °Please do not drive, operate heavy machinery, participate in activities at heights or participate in high speed sports until you have seen by Primary MD or a Neurologist and advised to do so again. ° °If you had Gastrointestinal Bleeding: °Please ask your Primary MD to check a complete blood count within one week of discharge or at your next visit. Your endoscopic/colonoscopic biopsies that are pending at the time of discharge, will also need to followed by your Primary MD. ° °Get Medicines reviewed and adjusted. °Please take all your medications with you for your next visit with your Primary MD ° °Please request your Primary MD to go over all hospital tests and procedure/radiological results at the follow up, please ask your Primary MD to get all Hospital records sent to his/her office. ° °If you experience worsening of your admission symptoms, develop shortness of breath, life threatening emergency, suicidal or homicidal thoughts you must seek medical attention immediately by calling 911 or calling your MD immediately  if symptoms less severe. ° °You must  read complete instructions/literature along with all the possible adverse reactions/side effects for all the Medicines you take and that have been prescribed to you. Take any new Medicines after you have completely understood and accpet all the possible adverse reactions/side effects.  ° °Do not drive or operate heavy machinery when taking Pain medications.  ° °Do not take more than prescribed Pain, Sleep and Anxiety Medications ° °Special Instructions: If you have smoked or chewed Tobacco  in the last 2 yrs please stop smoking, stop any regular Alcohol  and or any Recreational drug use. ° °Wear Seat belts while driving. ° °Please note °You were cared for by a hospitalist during your hospital stay. If you have any questions about your discharge medications or the care you received while you were in the hospital after you are discharged, you can call the unit and asked to speak with the hospitalist on call if the hospitalist that took care of you is not available. Once you are discharged, your primary care physician will handle any further medical issues. Please note that NO REFILLS for any discharge medications will be authorized once you are discharged, as it is imperative that you return   to your primary care physician (or establish a relationship with a primary care physician if you do not have one) for your aftercare needs so that they can reassess your need for medications and monitor your lab values. ° °You can reach the hospitalist office at phone [phone] or fax [phone] °  °If you do not have a primary care physician, you can call [phone] for a physician referral. ° ° ° °

## 2019-01-05 NOTE — Progress Notes (Signed)
Occupational Therapy Treatment Patient Details Name: Madison Medina MRN: [id number] DOB: Oct 27, 1961 Today's Date: 01/05/2019    History of present illness Madison Medina is an 58 y.o. female  With PMH HTN, MI, HLD, COPD, asthma, hyperthyroidism (had radiation now hypo) presented to [facility] ED for right arm numbness and weakness. 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 New abnormal appearance of the left ICA. Breast biopsy 12/28/2018 with family reporting positive for cancer.   OT comments  Pt immediately willing to work with OT. Performed toileting on BSC, standing grooming, UB dressing and eating with set up to min to no assist. Pt tending to lead with L (non dominant) hand, but using R as a functional assist spontaneously. Pt is eager to go home.   Follow Up Recommendations  CIR;Supervision/Assistance - 24 hour    Equipment Recommendations       Recommendations for Other Services      Precautions / Restrictions Precautions Precautions: Fall Precaution Comments: place gait belt above breasts (Bil biopsies 12/28/2018 at under side and lower part of breasts)       Mobility Bed Mobility Overal bed mobility: Needs Assistance Bed Mobility: Supine to Sit     Supine to sit: Supervision;HOB elevated     General bed mobility comments: supervision for safety, lines  Transfers Overall transfer level: Needs assistance Equipment used: None Transfers: Sit to/from Stand Sit to Stand: Min guard         General transfer comment: from bed and BSC    Balance Overall balance assessment: Needs assistance   Sitting balance-Leahy Scale: Good     Standing balance support: During functional activity Standing balance-Leahy Scale: Fair                             ADL either performed or assessed with clinical judgement   ADL Overall ADL's : Needs assistance/impaired Eating/Feeding: Modified  independent Eating/Feeding Details (indicate cue type and reason): with use of L hand Grooming: Wash/dry face;Wash/dry hands;Oral care;Standing;Minimal assistance Grooming Details (indicate cue type and reason): cues to use R hand, tends to lead with L         Upper Body Dressing : Minimal assistance;Sitting     Lower Body Dressing Details (indicate cue type and reason): pulled up socks crossing foot over opposite knee in bed Toilet Transfer: Min guard;Stand-pivot;BSC   Toileting- Clothing Manipulation and Hygiene: Min guard;Sit to/from stand       Functional mobility during ADLs: Minimal assistance(hand held)       Vision       Perception     Praxis      Cognition Arousal/Alertness: Awake/alert Behavior During Therapy: WFL for tasks assessed/performed Overall Cognitive Status: Impaired/Different from baseline                               Problem Solving: Decreased initiation          Exercises     Shoulder Instructions       General Comments      Pertinent Vitals/ Pain       Pain Assessment: No/denies pain  Home Living  Prior Functioning/Environment              Frequency  Min 3X/week        Progress Toward Goals  OT Goals(current goals can now be found in the care plan section)  Progress towards OT goals: Progressing toward goals  Acute Rehab OT Goals Patient Stated Goal: to go home OT Goal Formulation: With patient/family Time For Goal Achievement: 01/16/19 Potential to Achieve Goals: Good  Plan Discharge plan remains appropriate    Co-evaluation                 AM-PAC OT "6 Clicks" Daily Activity     Outcome Measure   Help from another person eating meals?: None Help from another person taking care of personal grooming?: A Little Help from another person toileting, which includes using toliet, bedpan, or urinal?: A Little Help from another person  bathing (including washing, rinsing, drying)?: A Little Help from another person to put on and taking off regular upper body clothing?: A Little Help from another person to put on and taking off regular lower body clothing?: A Little 6 Click Score: 19    End of Session Equipment Utilized During Treatment: Gait belt  OT Visit Diagnosis: Unsteadiness on feet (R26.81);Other abnormalities of gait and mobility (R26.89);Muscle weakness (generalized) (M62.81);Other symptoms and signs involving cognitive function;Cognitive communication deficit (R41.841);Dizziness and giddiness (R42) Symptoms and signs involving cognitive functions: Cerebral infarction   Activity Tolerance Patient tolerated treatment well   Patient Left in chair;with call bell/phone within reach   Nurse Communication          Time: 0301-3143 OT Time Calculation (min): 20 min  Charges: OT General Charges $OT Visit: 1 Visit OT Treatments $Self Care/Home Management : 8-22 mins  Nestor Lewandowsky, OTR/L Acute Rehabilitation Services Pager: [phone] Office: [phone]   Malka So 01/05/2019, 10:02 AM

## 2019-01-05 NOTE — Progress Notes (Signed)
    Pt has done well with left CEA Cath revealed mild - mod CAD  Has elevated LV EDP  Would DC on home dose of HCTZ. Follow up with her primary MD .   She may see [place] as needed.  [facility] will sign off.   Medication Recommendations:   Resume home meds  Other recommendations (labs, testing, etc):   Follow up as an outpatient:  With her primary MD    Mertie Moores, MD  01/05/2019 10:43 AM    [address],  [place], [place]  [address] Pager [phone] Phone: [phone]; Fax: [phone]

## 2019-01-12 ENCOUNTER — Ambulatory Visit: Payer: Medicare Other | Admitting: Neurology

## 2019-01-12 ENCOUNTER — Encounter: Payer: Self-pay | Admitting: Neurology

## 2019-01-12 VITALS — BP 151/94 | HR 77 | Ht 64.0 in | Wt 260.4 lb

## 2019-01-12 DIAGNOSIS — I6521 Occlusion and stenosis of right carotid artery: Secondary | ICD-10-CM

## 2019-01-12 DIAGNOSIS — I6522 Occlusion and stenosis of left carotid artery: Secondary | ICD-10-CM

## 2019-01-12 NOTE — Patient Instructions (Addendum)
I had a long d/w patient , her sister and daughter in law,about his recent stroke, risk for recurrent stroke/TIAs, personally independently reviewed imaging studies and stroke evaluation results and answered questions.Continue aspirin 81 mg daily  for secondary stroke prevention and maintain strict control of hypertension with blood pressure goal below 130/90, diabetes with hemoglobin A1c goal below 6.5% and lipids with LDL cholesterol goal below 70 mg/dL. I also advised the patient to eat a healthy diet with plenty of whole grains, cereals, fruits and vegetables, exercise regularly and maintain ideal body weight.  I have counseled her to quit smoking completely and she appears to be willing.  Check carotid ultrasound and if right carotid stenosis is less than 80% she may be neurologically cleared for bilateral breast lumpectomies which she needs for her diagnosis of early stage breast cancer.  She needs close monitoring of blood pressure and avoidance of hypotension in the perioperative.  To minimize periprocedural stroke risk.  She understands this and is willing.  I have discussed this with Dr. Trula Slade who is in agreement with the plan.  She was advised to keep her scheduled appointment to see him on February 10.  She was advised to start outpatient therapy for right hand to improve her fine motor skills followup in the future with my nurse practitioner Janett Billow in 3 months or call earlier if necessary  Coping with Quitting Smoking  Quitting smoking is a physical and mental challenge. You will face cravings, withdrawal symptoms, and temptation. Before quitting, work with your health care provider to make a plan that can help you cope. Preparation can help you quit and keep you from giving in. How can I cope with cravings? Cravings usually last for 5-10 minutes. If you get through it, the craving will pass. Consider taking the following actions to help you cope with cravings:  Keep your mouth busy: ? Chew  sugar-free gum. ? Suck on hard candies or a straw. ? Brush your teeth.  Keep your hands and body busy: ? Immediately change to a different activity when you feel a craving. ? Squeeze or play with a ball. ? Do an activity or a hobby, like making bead jewelry, practicing needlepoint, or working with wood. ? Mix up your normal routine. ? Take a short exercise break. Go for a quick walk or run up and down stairs. ? Spend time in public places where smoking is not allowed.  Focus on doing something kind or helpful for someone else.  Call a friend or family member to talk during a craving.  Join a support group.  Call a quit line, such as 1-800-QUIT-NOW.  Talk with your health care provider about medicines that might help you cope with cravings and make quitting easier for you. How can I deal with withdrawal symptoms? Your body may experience negative effects as it tries to get used to not having nicotine in the system. These effects are called withdrawal symptoms. They may include:  Feeling hungrier than normal.  Trouble concentrating.  Irritability.  Trouble sleeping.  Feeling depressed.  Restlessness and agitation.  Craving a cigarette. To manage withdrawal symptoms:  Avoid places, people, and activities that trigger your cravings.  Remember why you want to quit.  Get plenty of sleep.  Avoid coffee and other caffeinated drinks. These may worsen some of your symptoms. How can I handle social situations? Social situations can be difficult when you are quitting smoking, especially in the first few weeks. To manage this, you can:  Avoid parties, bars, and other social situations where people might be smoking.  Avoid alcohol.  Leave right away if you have the urge to smoke.  Explain to your family and friends that you are quitting smoking. Ask for understanding and support.  Plan activities with friends or family where smoking is not an option. What are some ways I  can cope with stress? Wanting to smoke may cause stress, and stress can make you want to smoke. Find ways to manage your stress. Relaxation techniques can help. For example:  Breathe slowly and deeply, in through your nose and out through your mouth.  Listen to soothing, relaxing music.  Talk with a family member or friend about your stress.  Light a candle.  Soak in a bath or take a shower.  Think about a peaceful place. What are some ways I can prevent weight gain? Be aware that many people gain weight after they quit smoking. However, not everyone does. To keep from gaining weight, have a plan in place before you quit and stick to the plan after you quit. Your plan should include:  Having healthy snacks. When you have a craving, it may help to: ? Eat plain popcorn, crunchy carrots, celery, or other cut vegetables. ? Chew sugar-free gum.  Changing how you eat: ? Eat small portion sizes at meals. ? Eat 4-6 small meals throughout the day instead of 1-2 large meals a day. ? Be mindful when you eat. Do not watch television or do other things that might distract you as you eat.  Exercising regularly: ? Make time to exercise each day. If you do not have time for a long workout, do short bouts of exercise for 5-10 minutes several times a day. ? Do some form of strengthening exercise, like weight lifting, and some form of aerobic exercise, like running or swimming.  Drinking plenty of water or other low-calorie or no-calorie drinks. Drink 6-8 glasses of water daily, or as much as instructed by your health care provider. Summary  Quitting smoking is a physical and mental challenge. You will face cravings, withdrawal symptoms, and temptation to smoke again. Preparation can help you as you go through these challenges.  You can cope with cravings by keeping your mouth busy (such as by chewing gum), keeping your body and hands busy, and making calls to family, friends, or a helpline for  people who want to quit smoking.  You can cope with withdrawal symptoms by avoiding places where people smoke, avoiding drinks with caffeine, and getting plenty of rest.  Ask your health care provider about the different ways to prevent weight gain, avoid stress, and handle social situations. This information is not intended to replace advice given to you by your health care provider. Make sure you discuss any questions you have with your health care provider. Document Released: 11/27/2016 Document Revised: 11/27/2016 Document Reviewed: 11/27/2016 Elsevier Interactive Patient Education  2019 [employer].   Stroke Prevention Some medical conditions and behaviors are associated with a higher chance of having a stroke. You can help prevent a stroke by making nutrition, lifestyle, and other changes, including managing any medical conditions you may have. What nutrition changes can be made?   Eat healthy foods. You can do this by: ? Choosing foods high in fiber, such as fresh fruits and vegetables and whole grains. ? Eating at least 5 or more servings of fruits and vegetables a day. Try to fill half of your plate at each meal with  fruits and vegetables. ? Choosing lean protein foods, such as lean cuts of meat, poultry without skin, fish, tofu, beans, and nuts. ? Eating low-fat dairy products. ? Avoiding foods that are high in salt (sodium). This can help lower blood pressure. ? Avoiding foods that have saturated fat, trans fat, and cholesterol. This can help prevent high cholesterol. ? Avoiding processed and premade foods.  Follow your health care provider's specific guidelines for losing weight, controlling high blood pressure (hypertension), lowering high cholesterol, and managing diabetes. These may include: ? Reducing your daily calorie intake. ? Limiting your daily sodium intake to 1,500 milligrams (mg). ? Using only healthy fats for cooking, such as olive oil, canola oil, or sunflower  oil. ? Counting your daily carbohydrate intake. What lifestyle changes can be made?  Maintain a healthy weight. Talk to your health care provider about your ideal weight.  Get at least 30 minutes of moderate physical activity at least 5 days a week. Moderate activity includes brisk walking, biking, and swimming.  Do not use any products that contain nicotine or tobacco, such as cigarettes and e-cigarettes. If you need help quitting, ask your health care provider. It may also be helpful to avoid exposure to secondhand smoke.  Limit alcohol intake to no more than 1 drink a day for nonpregnant women and 2 drinks a day for men. One drink equals 12 oz of beer, 5 oz of wine, or 1 oz of hard liquor.  Stop any illegal drug use.  Avoid taking birth control pills. Talk to your health care provider about the risks of taking birth control pills if: ? You are over 44 years old. ? You smoke. ? You get migraines. ? You have ever had a blood clot. What other changes can be made?  Manage your cholesterol levels. ? Eating a healthy diet is important for preventing high cholesterol. If cholesterol cannot be managed through diet alone, you may also need to take medicines. ? Take any prescribed medicines to control your cholesterol as told by your health care provider.  Manage your diabetes. ? Eating a healthy diet and exercising regularly are important parts of managing your blood sugar. If your blood sugar cannot be managed through diet and exercise, you may need to take medicines. ? Take any prescribed medicines to control your diabetes as told by your health care provider.  Control your hypertension. ? To reduce your risk of stroke, try to keep your blood pressure below 130/80. ? Eating a healthy diet and exercising regularly are an important part of controlling your blood pressure. If your blood pressure cannot be managed through diet and exercise, you may need to take medicines. ? Take any  prescribed medicines to control hypertension as told by your health care provider. ? Ask your health care provider if you should monitor your blood pressure at home. ? Have your blood pressure checked every year, even if your blood pressure is normal. Blood pressure increases with age and some medical conditions.  Get evaluated for sleep disorders (sleep apnea). Talk to your health care provider about getting a sleep evaluation if you snore a lot or have excessive sleepiness.  Take over-the-counter and prescription medicines only as told by your health care provider. Aspirin or blood thinners (antiplatelets or anticoagulants) may be recommended to reduce your risk of forming blood clots that can lead to stroke.  Make sure that any other medical conditions you have, such as atrial fibrillation or atherosclerosis, are managed. What are the warning  signs of a stroke? The warning signs of a stroke can be easily remembered as BEFAST.  B is for balance. Signs include: ? Dizziness. ? Loss of balance or coordination. ? Sudden trouble walking.  E is for eyes. Signs include: ? A sudden change in vision. ? Trouble seeing.  F is for face. Signs include: ? Sudden weakness or numbness of the face. ? The face or eyelid drooping to one side.  A is for arms. Signs include: ? Sudden weakness or numbness of the arm, usually on one side of the body.  S is for speech. Signs include: ? Trouble speaking (aphasia). ? Trouble understanding.  T is for time. ? These symptoms may represent a serious problem that is an emergency. Do not wait to see if the symptoms will go away. Get medical help right away. Call your local emergency services (911 in the [place] Do not drive yourself to the hospital.  Other signs of stroke may include: ? A sudden, severe headache with no known cause. ? Nausea or vomiting. ? Seizure. Where to find more information For more information, visit:  American Stroke Association:  www.strokeassociation.org  National Stroke Association: www.stroke.org Summary  You can prevent a stroke by eating healthy, exercising, not smoking, limiting alcohol intake, and managing any medical conditions you may have.  Do not use any products that contain nicotine or tobacco, such as cigarettes and e-cigarettes. If you need help quitting, ask your health care provider. It may also be helpful to avoid exposure to secondhand smoke.  Remember BEFAST for warning signs of stroke. Get help right away if you or a loved one has any of these signs. This information is not intended to replace advice given to you by your health care provider. Make sure you discuss any questions you have with your health care provider. Document Released: 01/07/2005 Document Revised: 01/05/2017 Document Reviewed: 01/05/2017 Elsevier Interactive Patient Education  2019 [employer].

## 2019-01-12 NOTE — Progress Notes (Signed)
[facility] [address] [place]. [place] [address] [phone]       OFFICE CONSULT NOTE  Madison Medina Date of Birth:  1961/11/17 Medical Record Number:  [id number]   Referring MD:  Lilia Pro Reason for Referral:  Carotid stenosis and stroke  HPI: [name] is a 57 year old pleasant Caucasian lady who seen today for initial office consultation visit.  She is accompanied by his sister daughter-in-law.  History is obtained from them and review of electronic medical records.  I personally reviewed imaging films in PACS.58 year old female with PMH of HTN, obesity, HLD, hypothyroid, COPD, tobacco abuse, transferred to [facility] from [facility] on 12/30/2018 for acute CVA. The morning of January 15 she had a breast biopsy at [facility] and around that time she had acute onset of right upper extremity tingling and numbness and shortly thereafter developed right lower extremity weakness. The next morning around 9 AM family noticed that patient was mumbling and had garbled speech but she herself was unaware of her speech and was able to understand what people said. However later that day she became confused. She was admitted for acute embolic stroke. Neurology was consulted. She completed stroke work-up.  CT scan of the head showed only mild white matter changes.  MRI scan showed patchy acute infarcts involving left cerebral hemisphere in the deep watershed zone.  CT angiogram of the head and neck showed severe proximal left ICA near occlusion with threadlike stenosis.  Right proximal ICA also showed severe 70% stenosis with plaque.  Transthoracic echo showed normal ejection fraction.  LDL cholesterol was 183 mg percent.  Hemoglobin A1c was 5.7.  Patient was started on aspirin and counseled to quit smoking. Her stroke was felt to be due to severe carotid artery stenosis. After cardiology performed cardiac cath and cleared her for procedure, She underwent carotid endarterectomy by  vascular surgery on 01/04/2018.  And did well.  Patient is breast biopsy was positive for cancer but the surgeon feels that she needs bilateral lumpectomy and has an excellent prognosis and is interested in doing the procedure but wants neurological clearance for the same.  ROS:   14 system review of systems is positive for facial swelling, ear pain, drooling, cough, wheezing, memory loss, headache, speech difficulty, weakness, joint pain and swelling, back pain, aching muscles, walking difficulty, neck pain and stiffness, apnea, daytime sleepiness and all other systems negative  PMH:  Past Medical History:  Diagnosis Date  . Asthma   . Back pain   . COPD (chronic obstructive pulmonary disease) ([place])   . Hyperlipidemia   . Hypertension   . Hypothyroidism   . Stroke [facility])     Social History:  Social History   Socioeconomic History  . Marital status: Married    Spouse name: Not on file  . Number of children: Not on file  . Years of education: Not on file  . Highest education level: Not on file  Occupational History  . Not on file  Social Needs  . Financial resource strain: Not on file  . Food insecurity:    Worry: Not on file    Inability: Not on file  . Transportation needs:    Medical: Not on file    Non-medical: Not on file  Tobacco Use  . Smoking status: Current Every Day Smoker    Packs/day: 2.00    Years: 40.00    Pack years: 80.00    Types: Cigarettes  . Smokeless tobacco: Never Used  Substance  and Sexual Activity  . Alcohol use: Not Currently    Frequency: Never  . Drug use: Never  . Sexual activity: Not on file  Lifestyle  . Physical activity:    Days per week: Not on file    Minutes per session: Not on file  . Stress: Not on file  Relationships  . Social connections:    Talks on phone: Not on file    Gets together: Not on file    Attends religious service: Not on file    Active member of club or organization: Not on file    Attends meetings of clubs  or organizations: Not on file    Relationship status: Not on file  . Intimate partner violence:    Fear of current or ex partner: Not on file    Emotionally abused: Not on file    Physically abused: Not on file    Forced sexual activity: Not on file  Other Topics Concern  . Not on file  Social History Narrative  . Not on file    Medications:   Current Outpatient Medications on File Prior to Visit  Medication Sig Dispense Refill  . acetaminophen (TYLENOL) 325 MG tablet Take 650 mg by mouth every 6 (six) hours as needed for mild pain, fever or headache.    Marland Kitchen amLODipine (NORVASC) 5 MG tablet Take 5 mg by mouth daily.     Marland Kitchen aspirin EC 325 MG EC tablet Take 1 tablet (325 mg total) by mouth daily. 30 tablet 0  . atorvastatin (LIPITOR) 80 MG tablet Take 80 mg by mouth daily at 6 PM.     . citalopram (CELEXA) 20 MG tablet Take 20 mg by mouth daily.    Marland Kitchen docusate sodium (COLACE) 100 MG capsule Take 1 capsule (100 mg total) by mouth daily. 10 capsule 0  . famotidine (PEPCID) 40 MG tablet Take 40 mg by mouth at bedtime.    . hydrochlorothiazide (HYDRODIURIL) 25 MG tablet Take 25 mg by mouth daily.     Marland Kitchen levothyroxine (SYNTHROID, LEVOTHROID) 200 MCG tablet Take 200 mcg by mouth daily before breakfast. (in addition to the 50mcg tablet)    . levothyroxine (SYNTHROID, LEVOTHROID) 25 MCG tablet Take 25 mcg by mouth daily before breakfast. (in addition to the 261mcg tablet)    . lisinopril (PRINIVIL,ZESTRIL) 20 MG tablet Take 20 mg by mouth 2 (two) times daily.     . montelukast (SINGULAIR) 10 MG tablet Take 10 mg by mouth at bedtime.     Marland Kitchen omeprazole (PRILOSEC) 40 MG capsule Take 40 mg by mouth daily.    Marland Kitchen oxyCODONE-acetaminophen (PERCOCET) 7.5-325 MG tablet Take 1 tablet by mouth 3 (three) times daily.      No current facility-administered medications on file prior to visit.     Allergies:  Not on File  Physical Exam General: Obese middle-aged Caucasian lady, seated, in no evident  distress Head: head normocephalic and atraumatic.   Neck: supple with no carotid or supraclavicular bruits.  Healed surgical scar from left carotid endarterectomy Cardiovascular: regular rate and rhythm, no murmurs Musculoskeletal: no deformity Skin:  no rash/petichiae Vascular:  Normal pulses all extremities  Neurologic Exam Mental Status: Awake and fully alert. Oriented to place and time. Recent and remote memory intact. Attention span, concentration and fund of knowledge appropriate. Mood and affect appropriate.  Cranial Nerves: Fundoscopic exam reveals sharp disc margins. Pupils equal, briskly reactive to light. Extraocular movements full without nystagmus. Visual fields full to confrontation. Hearing  intact. Facial sensation intact.  Mild right lower facial asymmetry, tongue, palate moves normally and symmetrically.  Motor: Normal bulk and tone. Normal strength in all tested extremity muscles.  Mild weakness of right grip and intrinsic hand muscles.  Orbits left over right upper extremity. Sensory.: intact to touch , pinprick , position and vibratory sensation.  Coordination: Rapid alternating movements normal in all extremities. Finger-to-nose and heel-to-shin performed accurately bilaterally. Gait and Station: Arises from chair without difficulty. Stance is normal. Gait demonstrates normal stride length and balance . Able to heel, toe and tandem walk without difficulty.  Reflexes: 1+ and symmetric. Toes downgoing.   NIHSS  1 Modified Rankin  2    ASSESSMENT: 58 year old Caucasian lady with symptomatic high-grade left carotid stenosis in January 2020 with multiple vascular risk factors of moderate right ICA stenosis, smoking, obesity, hypertension and hyperlipidemia     PLAN: I had a long d/w patient , her sister and daughter in law,about his recent stroke, risk for recurrent stroke/TIAs, personally independently reviewed imaging studies and stroke evaluation results and answered  questions.Continue aspirin 81 mg daily  for secondary stroke prevention and maintain strict control of hypertension with blood pressure goal below 130/90, diabetes with hemoglobin A1c goal below 6.5% and lipids with LDL cholesterol goal below 70 mg/dL. I also advised the patient to eat a healthy diet with plenty of whole grains, cereals, fruits and vegetables, exercise regularly and maintain ideal body weight.  I have counseled her to quit smoking completely and she appears to be willing.  Check carotid ultrasound and if right carotid stenosis is less than 80% she may be neurologically cleared for bilateral breast lumpectomies which she needs for her diagnosis of early stage breast cancer.  She needs close monitoring of blood pressure and avoidance of hypotension in the perioperative.  To minimize periprocedural stroke risk.  She understands this and is willing.  She is neurologically cleared for bilateral breast lumpectomy procedure unless carotid ultrasound shows critical right ICA stenosis in that case she will need that addressed prior to surgery I have discussed this with Dr. Trula Slade who is in agreement with the plan.  She was advised to keep her scheduled appointment to see him on February 10.  She was advised to start outpatient therapy for right hand to improve her fine motor skills.  Better than 50% time during this 50-minute consultation visit was spent on counseling and coordination of care about her symptomatic carotid stenosis, strokes, discussion about stroke prevention and need for aggressive risk factor modification and answered questions.  Followup in the future with my nurse practitioner Janett Billow in 3 months or call earlier if necessary Antony Contras, MD  [facility] [address] [place],  [address]  Phone [phone] Fax [phone] Note: This document was prepared with digital dictation and possible smart phrase technology. Any transcriptional  errors that result from this process are unintentional.

## 2019-01-13 ENCOUNTER — Encounter (HOSPITAL_COMMUNITY): Payer: Medicare Other

## 2019-01-16 DIAGNOSIS — Z170 Estrogen receptor positive status [ER+]: Secondary | ICD-10-CM

## 2019-01-16 DIAGNOSIS — C50411 Malignant neoplasm of upper-outer quadrant of right female breast: Secondary | ICD-10-CM

## 2019-01-16 DIAGNOSIS — B372 Candidiasis of skin and nail: Secondary | ICD-10-CM

## 2019-01-16 DIAGNOSIS — C50012 Malignant neoplasm of nipple and areola, left female breast: Secondary | ICD-10-CM

## 2019-01-16 DIAGNOSIS — I252 Old myocardial infarction: Secondary | ICD-10-CM

## 2019-01-17 ENCOUNTER — Ambulatory Visit (HOSPITAL_COMMUNITY)
Admission: RE | Admit: 2019-01-17 | Discharge: 2019-01-17 | Disposition: A | Discharge status: Home / Self Care | Payer: Medicare Other | Source: Ambulatory Visit | Attending: Neurology | Admitting: Neurology

## 2019-01-17 DIAGNOSIS — I6521 Occlusion and stenosis of right carotid artery: Secondary | ICD-10-CM | POA: Diagnosis present

## 2019-01-17 NOTE — Progress Notes (Signed)
Carotid artery duplex has been completed. Preliminary results can be found in CV Proc through chart review.   01/17/19 11:42 AM Madison Medina RVT

## 2019-01-19 ENCOUNTER — Telehealth: Payer: Self-pay

## 2019-01-19 NOTE — Telephone Encounter (Signed)
I called patient about her carotid dopplers results. I stated per Dr.SEthi the right carotid shows moderate, and mild left carotid stenosis in neck. Continue medical management for now. I explain ted to patient please take her blood pressure, aspirin,cholesterol medications and other medications as prescribed. I also stated that try to decrease her usage of smoking cigarettes or contact her PCP for some usage of stop smoking. Pt verbalized understanding. ------

## 2019-01-19 NOTE — Telephone Encounter (Signed)
Patient returned call, please call and advise.

## 2019-01-19 NOTE — Telephone Encounter (Signed)
-----   Message from Garvin Fila, MD sent at 01/18/2019  8:45 PM EST ----- Kindly inform patient that carotid dopplers show moderate right carotid and mild left carotid stenosis in neck. Continue medical management for now

## 2019-01-19 NOTE — Telephone Encounter (Signed)
Left vm for patient to call back about VAS results. ------

## 2019-01-23 ENCOUNTER — Encounter: Payer: Self-pay | Admitting: Surgery

## 2019-01-23 ENCOUNTER — Other Ambulatory Visit: Payer: Self-pay

## 2019-01-23 ENCOUNTER — Ambulatory Visit (INDEPENDENT_AMBULATORY_CARE_PROVIDER_SITE_OTHER): Payer: Self-pay | Admitting: Surgery

## 2019-01-23 VITALS — BP 137/85 | HR 79 | Temp 97.6°F | Ht 64.0 in | Wt 260.0 lb

## 2019-01-23 DIAGNOSIS — I6523 Occlusion and stenosis of bilateral carotid arteries: Secondary | ICD-10-CM

## 2019-01-23 NOTE — Progress Notes (Signed)
Patient name: Madison Medina MRN: [id number] DOB: 1961-11-06 Sex: female  REASON FOR VISIT:     post op  HISTORY OF PRESENT ILLNESS:   Madison Medina is a 58 y.o. female who presented to the hospital in mid January 2020 with confusion and right arm weakness.  She is found to have a left MCA acute infarct and a high-grade bilateral carotid stenosis.  She was transferred from [place] to [facility].  A repeat CT angiogram confirmed the stenosis in the left carotid artery.  On 01/04/2019 the patient underwent left carotid endarterectomy with patch angioplasty.  Intraoperative findings included a 90% stenosis.  She has no neurologic deficits.  Her right arm gets fatigued at the end of the day.  She starts physical therapy tomorrow.   The patient is medically managed for hypertension.  SHe has CAD and a history of MI.  SHe is a long term smoker and has COPD.  She is now taking a statin.   CURRENT MEDICATIONS:    Current Outpatient Medications  Medication Sig Dispense Refill  . acetaminophen (TYLENOL) 325 MG tablet Take 650 mg by mouth every 6 (six) hours as needed for mild pain, fever or headache.    Marland Kitchen amLODipine (NORVASC) 5 MG tablet Take 5 mg by mouth daily.     Marland Kitchen aspirin EC 325 MG EC tablet Take 1 tablet (325 mg total) by mouth daily. 30 tablet 0  . atorvastatin (LIPITOR) 80 MG tablet Take 80 mg by mouth daily at 6 PM.     . citalopram (CELEXA) 20 MG tablet Take 20 mg by mouth daily.    Marland Kitchen docusate sodium (COLACE) 100 MG capsule Take 1 capsule (100 mg total) by mouth daily. 10 capsule 0  . famotidine (PEPCID) 40 MG tablet Take 40 mg by mouth at bedtime.    . hydrochlorothiazide (HYDRODIURIL) 25 MG tablet Take 25 mg by mouth daily.     Marland Kitchen levothyroxine (SYNTHROID, LEVOTHROID) 200 MCG tablet Take 200 mcg by mouth daily before breakfast. (in addition to the 70mcg tablet)    . levothyroxine (SYNTHROID, LEVOTHROID) 25 MCG tablet Take 25 mcg by mouth daily before  breakfast. (in addition to the 235mcg tablet)    . lisinopril (PRINIVIL,ZESTRIL) 20 MG tablet Take 20 mg by mouth 2 (two) times daily.     . montelukast (SINGULAIR) 10 MG tablet Take 10 mg by mouth at bedtime.     Marland Kitchen omeprazole (PRILOSEC) 40 MG capsule Take 40 mg by mouth daily.    Marland Kitchen oxyCODONE-acetaminophen (PERCOCET) 7.5-325 MG tablet Take 1 tablet by mouth 3 (three) times daily.      No current facility-administered medications for this visit.     REVIEW OF SYSTEMS:   [X]  denotes positive finding, [ ]  denotes negative finding Cardiac  Comments:  Chest pain or chest pressure:    Shortness of breath upon exertion:    Short of breath when lying flat:    Irregular heart rhythm:    Constitutional    Fever or chills:      PHYSICAL EXAM:   Vitals:   01/23/19 1420 01/23/19 1421  BP: 134/74 137/85  Pulse: 79   Temp: 97.6 F (36.4 C)   TempSrc: Oral   SpO2: 96%   Weight: 260 lb (117.9 kg)   Height: 5\' 4"  (1.626 m)     GENERAL: The patient is a well-nourished female, in no acute distress. The vital signs are documented above. CARDIOVASCULAR: There is a regular rate and  rhythm. PULMONARY: Non-labored respirations Incision is healing nicely  STUDIES:   Carotid duplex: Right Carotid: Velocities in the right ICA are consistent with a 60-79%                stenosis.  Left Carotid: Velocities in the left ICA are consistent with a 1-39% stenosis.   MEDICAL ISSUES:   Status post left carotid endarterectomy for symptomatic stenosis: By ultrasound today the endarterectomy site is widely patent.  She has no neurologic deficits.  She is starting physical therapy tomorrow.  Right carotid stenosis: This is less than 80%.  I will have her follow-up in 6 months with a repeat ultrasound.  She is scheduled to have breast surgery in the near future.  She is cleared from a vascular perspective.  Annamarie Major, MD [facility] [phone] Pager [phone]

## 2019-02-16 ENCOUNTER — Telehealth: Payer: Self-pay

## 2019-02-16 NOTE — Telephone Encounter (Signed)
I called Armed forces training and education officer at [phone]. I stated Dr.Sethi will be in the office on 02/20/2019 because he works in the hospital the other week. I stated office notes,and vas ultrasound report were fax. DR.Sethi put in his note pt was cleared neurologically cleared for the procedure. Ivin Booty RN stated she did receive the office notes and vas report that was fax by me. She is fine with DR.SEthi signing the page for clearance on Monday and fax it in the am.

## 2019-04-12 ENCOUNTER — Telehealth: Payer: Self-pay

## 2019-04-12 NOTE — Telephone Encounter (Signed)
Text message has been sent with instructions on how to access the visit.

## 2019-04-12 NOTE — Telephone Encounter (Signed)
error 

## 2019-04-12 NOTE — Telephone Encounter (Signed)
Unable to get in contact with the patient to convert office visit into a doxy.me appt with Janett Billow. I left a voicemail asking her to return my call. Office number was provided.

## 2019-04-12 NOTE — Telephone Encounter (Signed)
pts daughter in law called Joelene Millin (dpr) and gave consent for pts doxy.me appt , she states she will be there with her and help her with appt, she has [employer] cell  Service  Cb# [phone]

## 2019-04-13 NOTE — Progress Notes (Signed)
[facility] [address] [place]. [place] [address] [phone]       FOLLOW UP NOTE  Ms. Madison Medina Date of Birth:  1961-08-19 Medical Record Number:  [id number]   Referring MD:  Lilia Pro Reason for Referral:  Carotid stenosis and stroke  Virtual Visit via Video Note  I connected with Madison Medina on 04/13/19 at  9:45 AM EDT by a video enabled telemedicine application located remotely in my own home and verified that I am speaking with the correct person using two identifiers who was located at their own home.   I discussed the limitations of evaluation and management by telemedicine and the availability of in person appointments. The patient expressed understanding and agreed to proceed.   HPI:   Madison Medina is a 58 y.o. female with underlying medical history HTN, HLD, obesity, hypothyroidism, COPD, tobacco use and left cerebellar hemisphere infarcts s/p left ICA carotid endarterectomy on 12/30/2018.  She was initially scheduled today for a face-to-face stroke follow-up visit but due to COVID-19 safety precautions, visit transition to telemedicine via [email or web address] with patient's consent. She has been stable from a stroke standpoint without residual deficits or recurring symptoms.  She does have symptoms of 2-week left mid forearm tingling which occurs 2-3 times per week and only last for couple minutes.  She has not experienced these symptoms prior.  Denies any other deficits such as weakness in upper or lower extremities, no other areas of numbness/tingling or headache.  Underlying history of neck pain but denies worsening or any recent trauma.  She continues on aspirin 325 mg without side effects of bleeding or bruising.  Continues on atorvastatin 80 mg without side effects myalgias.  Blood pressure typically 140s/80s.  she did have follow-up visit with vascular surgery on 01/23/2019 with repeat carotid duplex showing right ICA 60 to 79% stenosis and left ICA 1 to 39%  stenosis.  Recommended to follow-up in 6 months with a repeat ultrasound.  No further concerns at this time.  Denies new or worsening stroke/TIA symptoms.     ROS:   14 system review of systems is positive for numbness/tingling and joint pain and all other systems negative  PMH:  Past Medical History:  Diagnosis Date  . Asthma   . Back pain   . Carotid artery occlusion   . COPD (chronic obstructive pulmonary disease) ([place])   . Hyperlipidemia   . Hypertension   . Hypothyroidism   . Stroke [facility])     Social History:  Social History   Socioeconomic History  . Marital status: Married    Spouse name: Not on file  . Number of children: Not on file  . Years of education: Not on file  . Highest education level: Not on file  Occupational History  . Not on file  Social Needs  . Financial resource strain: Not on file  . Food insecurity:    Worry: Not on file    Inability: Not on file  . Transportation needs:    Medical: Not on file    Non-medical: Not on file  Tobacco Use  . Smoking status: Current Every Day Smoker    Packs/day: 2.00    Years: 40.00    Pack years: 80.00    Types: Cigarettes  . Smokeless tobacco: Never Used  Substance and Sexual Activity  . Alcohol use: Not Currently    Frequency: Never  . Drug use: Never  . Sexual activity: Not on file  Lifestyle  .  Physical activity:    Days per week: Not on file    Minutes per session: Not on file  . Stress: Not on file  Relationships  . Social connections:    Talks on phone: Not on file    Gets together: Not on file    Attends religious service: Not on file    Active member of club or organization: Not on file    Attends meetings of clubs or organizations: Not on file    Relationship status: Not on file  . Intimate partner violence:    Fear of current or ex partner: Not on file    Emotionally abused: Not on file    Physically abused: Not on file    Forced sexual activity: Not on file  Other Topics Concern   . Not on file  Social History Narrative  . Not on file    Medications:   Current Outpatient Medications on File Prior to Visit  Medication Sig Dispense Refill  . acetaminophen (TYLENOL) 325 MG tablet Take 650 mg by mouth every 6 (six) hours as needed for mild pain, fever or headache.    Marland Kitchen amLODipine (NORVASC) 5 MG tablet Take 5 mg by mouth daily.     Marland Kitchen aspirin EC 325 MG EC tablet Take 1 tablet (325 mg total) by mouth daily. 30 tablet 0  . atorvastatin (LIPITOR) 80 MG tablet Take 80 mg by mouth daily at 6 PM.     . citalopram (CELEXA) 20 MG tablet Take 20 mg by mouth daily.    Marland Kitchen docusate sodium (COLACE) 100 MG capsule Take 1 capsule (100 mg total) by mouth daily. 10 capsule 0  . famotidine (PEPCID) 40 MG tablet Take 40 mg by mouth at bedtime.    . hydrochlorothiazide (HYDRODIURIL) 25 MG tablet Take 25 mg by mouth daily.     Marland Kitchen levothyroxine (SYNTHROID, LEVOTHROID) 200 MCG tablet Take 200 mcg by mouth daily before breakfast. (in addition to the 24mcg tablet)    . levothyroxine (SYNTHROID, LEVOTHROID) 25 MCG tablet Take 25 mcg by mouth daily before breakfast. (in addition to the 217mcg tablet)    . lisinopril (PRINIVIL,ZESTRIL) 20 MG tablet Take 20 mg by mouth 2 (two) times daily.     . montelukast (SINGULAIR) 10 MG tablet Take 10 mg by mouth at bedtime.     Marland Kitchen omeprazole (PRILOSEC) 40 MG capsule Take 40 mg by mouth daily.    Marland Kitchen oxyCODONE-acetaminophen (PERCOCET) 7.5-325 MG tablet Take 1 tablet by mouth 3 (three) times daily.      No current facility-administered medications on file prior to visit.     Allergies:  Not on File  Physical Exam General: Obese pleasant middle-aged Caucasian lady, seated, in no evident distress Head: head normocephalic and atraumatic.     Neurologic Exam Mental Status: Awake and fully alert. Oriented to place and time. Recent and remote memory intact. Attention span, concentration and fund of knowledge appropriate. Mood and affect appropriate.  Cranial  Nerves: Extraocular movements full without nystagmus. Hearing intact to voice. facial, tongue, palate moves normally and symmetrically.  Shoulder shrug symmetric. Motor: No evidence of weakness project assessment Sensory.:  UTA Coordination: Rapid alternating movements normal in all extremities. Finger-to-nose and heel-to-shin performed accurately bilaterally. Gait and Station: Arises from chair without difficulty. Stance is normal. Gait demonstrates normal stride length and balance .  Reflexes: UTA   ASSESSMENT: Madison Medina is a 58 year old Caucasian lady with left cerebellar hemisphere infarcts and symptomatic high-grade left carotid  stenosis in January 2020 s/p left ICA endarterectomy.  Multiple vascular risk factors of moderate right ICA stenosis, smoking, obesity, hypertension and hyperlipidemia.  Prior residual deficits resolved but has recent 2-week complaint of intermittent left mid forearm numbness/tingling without any other reported deficits.   PLAN: -No indication at this time for any type of imaging with complaints left midforearm numbness/tingling 2- 3 times per week.  Advised her that if she starts to experience this more consistently for if this becomes associated with any type of weakness, worsening or any other neurological symptoms, to call 911 for further evaluation -Continue aspirin 81 mg daily and atorvastatin 80 mg daily -Continue to follow with PCP for HLD and HTN management -request repeat lipid panel at follow-up visit with PCP -Continue to follow with vascular surgery for ongoing surveillance monitoring of ICA stenosis -Continue to monitor blood pressure at home -Continue stay active and maintain healthy diet - Maintain strict control of hypertension with blood pressure goal below 130/90 with avoidance of hypotension, diabetes with hemoglobin A1c goal below 6.5% and cholesterol with LDL cholesterol (bad cholesterol) goal below 70 mg/dL.  I also advised the patient to eat  a healthy diet with plenty of whole grains, cereals, fruits and vegetables, exercise regularly with at least 30 minutes of continuous activity daily and maintain ideal body weight.    Better than 50% time during this 25-minute non-face-to-face visit was spent discussing resolution of stroke symptoms, recent numbness/tingling and ongoing monitoring/management, discussion about stroke prevention and need for aggressive risk factor modification and answered questions.    Venancio Poisson, AGNP-BC  [facility] [address] [place], [place] [address]  Phone [phone] Fax [phone] Note: This document was prepared with digital dictation and possible smart phrase technology. Any transcriptional errors that result from this process are unintentional.

## 2019-04-17 ENCOUNTER — Encounter: Payer: Self-pay | Admitting: Adult Health

## 2019-04-17 ENCOUNTER — Other Ambulatory Visit: Payer: Self-pay

## 2019-04-17 ENCOUNTER — Ambulatory Visit (INDEPENDENT_AMBULATORY_CARE_PROVIDER_SITE_OTHER): Payer: Medicare Other | Admitting: Adult Health

## 2019-04-17 DIAGNOSIS — I63239 Cerebral infarction due to unspecified occlusion or stenosis of unspecified carotid arteries: Secondary | ICD-10-CM | POA: Diagnosis not present

## 2019-04-17 DIAGNOSIS — I6521 Occlusion and stenosis of right carotid artery: Secondary | ICD-10-CM | POA: Diagnosis not present

## 2019-04-17 DIAGNOSIS — I10 Essential (primary) hypertension: Secondary | ICD-10-CM

## 2019-04-17 DIAGNOSIS — E785 Hyperlipidemia, unspecified: Secondary | ICD-10-CM

## 2019-04-17 NOTE — Progress Notes (Signed)
I agree with the above plan 

## 2019-04-17 NOTE — Telephone Encounter (Signed)
Pt returned call and link is still unavailable, pts appt is schedule for 9:45am

## 2019-04-17 NOTE — Telephone Encounter (Signed)
New text message has been sent to the daughter in laws phone

## 2019-04-17 NOTE — Telephone Encounter (Signed)
pts daughter in law called in and stated the message that was sent didn't have any instructions on what she was suppose to do

## 2019-04-17 NOTE — Telephone Encounter (Signed)
I spoke with Madison Medina about not receiving the link. I stated it was sent last week and today. Madison Medina stated she receive the text but in the body but no link was receive in it. I email pt the link and she receive it. I stated to click link 5 minutes prior and put in patients first and last name. She verbalized understanding.

## 2019-05-09 DIAGNOSIS — G9341 Metabolic encephalopathy: Secondary | ICD-10-CM

## 2019-05-09 DIAGNOSIS — Z170 Estrogen receptor positive status [ER+]: Secondary | ICD-10-CM

## 2019-05-09 DIAGNOSIS — I959 Hypotension, unspecified: Secondary | ICD-10-CM

## 2019-05-09 DIAGNOSIS — C50411 Malignant neoplasm of upper-outer quadrant of right female breast: Secondary | ICD-10-CM

## 2019-05-09 DIAGNOSIS — I2510 Atherosclerotic heart disease of native coronary artery without angina pectoris: Secondary | ICD-10-CM

## 2019-05-09 DIAGNOSIS — N179 Acute kidney failure, unspecified: Secondary | ICD-10-CM

## 2019-05-09 DIAGNOSIS — R9431 Abnormal electrocardiogram [ECG] [EKG]: Secondary | ICD-10-CM

## 2019-05-09 DIAGNOSIS — R55 Syncope and collapse: Secondary | ICD-10-CM

## 2019-05-10 DIAGNOSIS — R55 Syncope and collapse: Secondary | ICD-10-CM

## 2019-05-10 DIAGNOSIS — R9431 Abnormal electrocardiogram [ECG] [EKG]: Secondary | ICD-10-CM | POA: Diagnosis not present

## 2019-05-10 DIAGNOSIS — G9341 Metabolic encephalopathy: Secondary | ICD-10-CM | POA: Diagnosis not present

## 2019-05-10 DIAGNOSIS — N179 Acute kidney failure, unspecified: Secondary | ICD-10-CM | POA: Diagnosis not present

## 2019-05-11 ENCOUNTER — Telehealth: Payer: Self-pay

## 2019-05-11 DIAGNOSIS — R55 Syncope and collapse: Secondary | ICD-10-CM | POA: Diagnosis not present

## 2019-05-11 DIAGNOSIS — R9431 Abnormal electrocardiogram [ECG] [EKG]: Secondary | ICD-10-CM | POA: Diagnosis not present

## 2019-05-11 DIAGNOSIS — G9341 Metabolic encephalopathy: Secondary | ICD-10-CM | POA: Diagnosis not present

## 2019-05-11 DIAGNOSIS — N179 Acute kidney failure, unspecified: Secondary | ICD-10-CM | POA: Diagnosis not present

## 2019-05-11 NOTE — Telephone Encounter (Signed)
Daughter in law called and said that patient passed out this weekend and was taken to [facility] and they would not release her until she has an appt here to be seen.   Called back but daughter in law could not give me a lot of information other than that.   [facility] and they said that it was not an appt with [place] that was needed but an appt with cards for low BP and syncope.   Called and advised daughter in law of aboe and gave her the contact information for Dr Daneen Schick.   York Cerise, CMA

## 2019-05-15 ENCOUNTER — Telehealth: Payer: Self-pay | Admitting: *Deleted

## 2019-05-15 NOTE — Telephone Encounter (Signed)
Call placed to pt re: appt 05/25/2019.  Cecilie Kicks, NP, will not be in the office that day and her clinic has been cancelled. Left a message for pt to call back so her appt can be rescheduled.

## 2019-05-23 ENCOUNTER — Telehealth: Payer: Self-pay | Admitting: *Deleted

## 2019-05-23 NOTE — Telephone Encounter (Signed)
COVID-19 Pre-Screening Questions:  . In the past 7 to 10 days have you had a cough,  shortness of breath, headache, congestion, fever (100 or greater) body aches, chills, sore throat, or sudden loss of taste or sense of smell? NO . Have you been around anyone with known Covid 19. NO . Have you been around anyone who is awaiting Covid 19 test results in the past 7 to 10 days? SHE HADN'T BUT HER SON HAS AND SHE HAS BEEN AROUND HER SON EVERYDAY..  PT WAS SWITCHED TO AN IN OFFICE VISIT Have you been around anyone who has been exposed to Covid 19, or has mentioned symptoms of Covid 19 within the past 7 to 10 days? YES, HER SON. PTS APPT WAS SWITCHED TO A VIRTUAL. If you have any concerns/questions about symptoms patients report during screening (either on the phone or at threshold). Contact the provider seeing the patient or DOD for further guidance.  If neither are available contact a member of the leadership team.     Virtual Visit Pre-Appointment Phone Call  "(Name), I am calling you today to discuss your upcoming appointment. We are currently trying to limit exposure to the virus that causes COVID-19 by seeing patients at home rather than in the office."  1. "What is the BEST phone number to call the day of the visit?" - include this in appointment notes  2. "Do you have or have access to (through a family member/friend) a smartphone with video capability that we can use for your visit?" a. If yes - list this number in appt notes as "cell" (if different from BEST phone #) and list the appointment type as a VIDEO visit in appointment notes b. If no - list the appointment type as a PHONE visit in appointment notes  3. Confirm consent - "In the setting of the current Covid19 crisis, you are scheduled for a (phone or video) visit with your provider on (date) at (time).  Just as we do with many in-office visits, in order for you to participate in this visit, we must obtain consent.  If  you'd like, I can send this to your mychart (if signed up) or email for you to review.  Otherwise, I can obtain your verbal consent now.  All virtual visits are billed to your insurance company just like a normal visit would be.  By agreeing to a virtual visit, we'd like you to understand that the technology does not allow for your provider to perform an examination, and thus may limit your provider's ability to fully assess your condition. If your provider identifies any concerns that need to be evaluated in person, we will make arrangements to do so.  Finally, though the technology is pretty good, we cannot assure that it will always work on either your or our end, and in the setting of a video visit, we may have to convert it to a phone-only visit.  In either situation, we cannot ensure that we have a secure connection.  Are you willing to proceed?" STAFF: Did the patient verbally acknowledge consent to telehealth visit? Document YES/NO here: YES  4. Advise patient to be prepare3d - "Two hours prior to your appointment, go ahead and check your blood pressure, pulse, oxygen saturation, and your weight (if you have the equipment to check those) and write them all down. When your visit starts, your provider will ask you for this information. If you have an Apple Watch  or Kardia device, please plan to have heart rate information ready on the day of your appointment. Please have a pen and paper handy nearby the day of the visit as well."  5. Give patient instructions for MyChart download to smartphone OR Doximity/Doxy.me as below if video visit (depending on what platform provider is using)  6. Inform patient they will receive a phone call 15 minutes prior to their appointment time (may be from unknown caller ID) so they should be prepared to answer    TELEPHONE CALL NOTE  ALECE KOPPEL has been deemed a candidate for a follow-up tele-health visit to limit community exposure during the Covid-19 pandemic. I  spoke with the patient via phone to ensure availability of phone/video source, confirm preferred email & phone number, and discuss instructions and expectations.  I reminded ELLIANAH CORDY to be prepared with any vital sign and/or heart rhythm information that could potentially be obtained via home monitoring, at the time of her visit. I reminded AUDRIONNA LAMPTON to expect a phone call prior to her visit.  Jeanann Lewandowsky, [place] 05/23/2019 4:34 PM   INSTRUCTIONS FOR DOWNLOADING THE MYCHART APP TO SMARTPHONE  - The patient must first make sure to have activated MyChart and know their login information - If Apple, go to [employer] and type in MyChart in the search bar and download the app. If Android, ask patient to go to [employer] and type in [place] in the search bar and download the app. The app is free but as with any other app downloads, their phone may require them to verify saved payment information or Apple/Android password.  - The patient will need to then log into the app with their MyChart username and password, and select [facility] as their healthcare provider to link the account. When it is time for your visit, go to the MyChart app, find appointments, and click Begin Video Visit. Be sure to Select Allow for your device to access the Microphone and Camera for your visit. You will then be connected, and your provider will be with you shortly.  **If they have any issues connecting, or need assistance please contact MyChart service desk [phone])**  **If using a computer, in order to ensure the best quality for their visit they will need to use either of the following Internet Browsers: [employer], or Google Chrome**  IF USING DOXIMITY or DOXY.ME - The patient will receive a link just prior to their visit by text.     FULL LENGTH CONSENT FOR TELE-HEALTH VISIT   I hereby voluntarily request, consent and authorize [facility] and its employed or contracted  physicians, physician assistants, nurse practitioners or other licensed health care professionals (the Practitioner), to provide me with telemedicine health care services (the "Services") as deemed necessary by the treating Practitioner. I acknowledge and consent to receive the Services by the Practitioner via telemedicine. I understand that the telemedicine visit will involve communicating with the Practitioner through live audiovisual communication technology and the disclosure of certain medical information by electronic transmission. I acknowledge that I have been given the opportunity to request an in-person assessment or other available alternative prior to the telemedicine visit and am voluntarily participating in the telemedicine visit.  I understand that I have the right to withhold or withdraw my consent to the use of telemedicine in the course of my care at any time, without affecting my right to future care or treatment, and that the Practitioner or  I may terminate the telemedicine visit at any time. I understand that I have the right to inspect all information obtained and/or recorded in the course of the telemedicine visit and may receive copies of available information for a reasonable fee.  I understand that some of the potential risks of receiving the Services via telemedicine include:  Marland Kitchen Delay or interruption in medical evaluation due to technological equipment failure or disruption; . Information transmitted may not be sufficient (e.g. poor resolution of images) to allow for appropriate medical decision making by the Practitioner; and/or  . In rare instances, security protocols could fail, causing a breach of personal health information.  Furthermore, I acknowledge that it is my responsibility to provide information about my medical history, conditions and care that is complete and accurate to the best of my ability. I acknowledge that Practitioner's advice, recommendations, and/or decision  may be based on factors not within their control, such as incomplete or inaccurate data provided by me or distortions of diagnostic images or specimens that may result from electronic transmissions. I understand that the practice of medicine is not an exact science and that Practitioner makes no warranties or guarantees regarding treatment outcomes. I acknowledge that I will receive a copy of this consent concurrently upon execution via email to the email address I last provided but may also request a printed copy by calling the office of [facility].    I understand that my insurance will be billed for this visit.   I have read or had this consent read to me. . I understand the contents of this consent, which adequately explains the benefits and risks of the Services being provided via telemedicine.  . I have been provided ample opportunity to ask questions regarding this consent and the Services and have had my questions answered to my satisfaction. . I give my informed consent for the services to be provided through the use of telemedicine in my medical care  By participating in this telemedicine visit I agree to the above.

## 2019-05-25 ENCOUNTER — Telehealth (INDEPENDENT_AMBULATORY_CARE_PROVIDER_SITE_OTHER): Payer: Medicare Other | Admitting: Physician Assistant

## 2019-05-25 ENCOUNTER — Telehealth: Payer: Self-pay | Admitting: Radiology

## 2019-05-25 ENCOUNTER — Other Ambulatory Visit: Payer: Self-pay

## 2019-05-25 ENCOUNTER — Ambulatory Visit: Payer: Medicare Other | Admitting: Cardiology

## 2019-05-25 ENCOUNTER — Encounter: Payer: Self-pay | Admitting: Physician Assistant

## 2019-05-25 VITALS — BP 151/89 | HR 72 | Ht 64.0 in | Wt 270.0 lb

## 2019-05-25 DIAGNOSIS — R55 Syncope and collapse: Secondary | ICD-10-CM

## 2019-05-25 DIAGNOSIS — I10 Essential (primary) hypertension: Secondary | ICD-10-CM | POA: Diagnosis not present

## 2019-05-25 NOTE — Patient Instructions (Signed)
Medication Instructions:  Your physician recommends that you continue on your current medications as directed. Please refer to the Current Medication list given to you today.  If you need a refill on your cardiac medications before your next appointment, please call your pharmacy.   Lab work: None ordered If you have labs (blood work) drawn today and your tests are completely normal, you will receive your results only by: Marland Kitchen MyChart Message (if you have MyChart) OR . A paper copy in the mail If you have any lab test that is abnormal or we need to change your treatment, we will call you to review the results.  Testing/Procedures: Your physician has recommended that you wear an event monitor. Event monitors are medical devices that record the heart's electrical activity. Doctors most often [place] these monitors to diagnose arrhythmias. Arrhythmias are problems with the speed or rhythm of the heartbeat. The monitor is a small, portable device. You can wear one while you do your normal daily activities. This is usually used to diagnose what is causing palpitations/syncope (passing out).    Follow-Up: At [facility], you and your health needs are our priority.  As part of our continuing mission to provide you with exceptional heart care, we have created designated Provider Care Teams.  These Care Teams include your primary Cardiologist (physician) and Advanced Practice Providers (APPs -  Physician Assistants and Nurse Practitioners) who all work together to provide you with the care you need, when you need it. You will need a follow up appointment in 6 weeks with one of our Doctors of our [employer] Provider Team in [place]:   Any Other Special Instructions Will Be Listed Below (If Applicable).  Ambulatory Cardiac Monitoring An ambulatory cardiac monitor is a small recording device that is used to detect abnormal heart rhythms (arrhythmias). Most monitors are connected by wires to flat, sticky  disks (electrodes) that are then attached to your chest. You may need to wear a monitor if you have had symptoms such as:  Fast heartbeats (palpitations).  Dizziness.  Fainting or light-headedness.  Unexplained weakness.  Shortness of breath. There are several types of monitors. Some common monitors include:  Holter monitor. This records your heart rhythm continuously, usually for 24-48 hours.  Event (episodic) monitor. This monitor has a symptoms button, and when pushed, it will begin recording. You need to activate this monitor to record when you have a heart-related symptom.  Automatic detection monitor. This monitor will begin recording when it detects an abnormal heartbeat. What are the risks? Generally, these devices are safe to use. However, it is possible that the skin under the electrodes will become irritated. How to prepare for monitoring Your health care provider will prepare your chest for the electrode placement and show you how to use the monitor.  Do not apply lotions to your chest before monitoring.  Follow directions on how to care for the monitor, and how to return the monitor when the testing period is complete. How to use your cardiac monitor  Follow directions about how long to wear the monitor, and if you can take the monitor off in order to shower or bathe. ? Do not let the monitor get wet. ? Do not bathe, swim, or use a hot tub while wearing the monitor.  Keep your skin clean. Do not put body lotion or moisturizer on your chest.  Change the electrodes as told by your health care provider, or any time they stop sticking to your skin.  You may need to use medical tape to keep them on.  Try to put the electrodes in slightly different places on your chest to help prevent skin irritation. Follow directions from your health care provider about where to place the electrodes.  Make sure the monitor is safely clipped to your clothing or in a location close to your  body as recommended by your health care provider.  If your monitor has a symptoms button, press the button to mark an event as soon as you feel a heart-related symptom, such as: ? Dizziness. ? Weakness. ? Light-headedness. ? Palpitations. ? Thumping or pounding in your chest. ? Shortness of breath. ? Unexplained weakness.  Keep a diary of your activities, such as walking, doing chores, and taking medicine. It is very important to note what you were doing when you pushed the button to record your symptoms. This will help your health care provider determine what might be contributing to your symptoms.  Send the recorded information as recommended by your health care provider. It may take some time for your health care provider to process the results.  Change the batteries as told by your health care provider.  Keep electronic devices away from your monitor. These include: ? Tablets. ? MP3 players. ? Cell phones.  While wearing your monitor you should avoid: ? Electric blankets. ? Armed forces operational officer. ? Electric toothbrushes. ? Microwave ovens. ? Magnets. ? Metal detectors. Get help right away if:  You have chest pain.  You have shortness of breath or extreme difficulty breathing.  You develop a very fast heartbeat that does not get better.  You develop dizziness that does not go away.  You faint or constantly feel like you are about to faint. Summary  An ambulatory cardiac monitor is a small recording device that is used to detect abnormal heart rhythms (arrhythmias).  Make sure you understand how to send the information from the monitor to your health care provider.  It is important to press the button on the monitor when you have any heart-related symptoms.  Keep a diary of your activities, such as walking, doing chores, and taking medicine. It is very important to note what you were doing when you pushed the button to record your symptoms. This will help your health care  provider learn what might be causing your symptoms. This information is not intended to replace advice given to you by your health care provider. Make sure you discuss any questions you have with your health care provider. Document Released: 09/08/2008 Document Revised: 09/15/2017 Document Reviewed: 11/14/2016 Elsevier Interactive Patient Education  2019 [employer].

## 2019-05-25 NOTE — Progress Notes (Signed)
Virtual Visit via Video Note   This visit type was conducted due to national recommendations for restrictions regarding the COVID-19 Pandemic (e.g. social distancing) in an effort to limit this patient's exposure and mitigate transmission in our community.  Due to her co-morbid illnesses, this patient is at least at moderate risk for complications without adequate follow up.  This format is felt to be most appropriate for this patient at this time.  All issues noted in this document were discussed and addressed.  A limited physical exam was performed with this format.  Please refer to the patient's chart for her consent to telehealth for [facility].   Date:  05/25/2019   ID:  Madison Medina, DOB 1961-01-23, MRN [id number]  Patient Location: Home Provider Location: Office  PCP:  Welford Roche, NP  Cardiologist:  Dr. Acie Fredrickson (will be followed up in [place] office)  Evaluation Performed:  Follow-Up Visit  Chief Complaint:  Hospital follow up  History of Present Illness:    Madison Medina is a 58 y.o. 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   Admitted Jan 2020 with acute L MCA 2nd to high grade bilateral carotid stenosis. On 01/04/2019 the patient underwent left carotid endarterectomy with patch angioplasty.  Seen by vascular 01/2019 and noted patent patient endarterectomy and 60-79% R side stenosis. Recommended repeat ultrasound in 6 months.   Admitted 5/26-5/28/20 for syncope from cancer center while being evaluated on exam table. Felt dizzy when standing.  It was noted that patient had 49-month history of dizziness and lightheadedness while standing from sitting or laying position.  However no prior syncope episode. Poor PO intake. Felt orthostatic in nature. CT of head negative. MRI of brain without acute infract.  Carotid doppler with 70% R and 50% L stenosis. EKG with stable QT prolongation. Echo showed LVEF. Symptoms improved with IV fluids. Discontinued HCTZ. Continued Amlodipine and Lisinopril. (I have personally reviewed discharge summary and all studies -will scan in Epic).   Patient is seen for follow-up as virtual visit.  Patient denies recurrent syncope episode.  Has improved her p.o. intake.  She reports feeling poor due to her breast cancer.  Plan to start radiation today otherwise early next week.  She has intermittent palpitation lasting for few minutes.  No orthopnea, PND, lower extremity edema or melena.  The patient does not have symptoms concerning for COVID-19 infection (fever, chills, cough, or new shortness of breath).   Past Medical History:  Diagnosis Date  . Asthma   . Back pain   . Carotid artery occlusion   . COPD (chronic obstructive pulmonary disease) ([place])   . Hyperlipidemia   . Hypertension   . Hypothyroidism   . Stroke [facility])    Past Surgical History:  Procedure Laterality Date  . BREAST BIOPSY  12/2018   at breast center in [place]  . CAROTID ENDARTERECTOMY    . CARPAL TUNNEL RELEASE    . CYST EXCISION    . ENDARTERECTOMY Left 01/04/2019   Procedure: ENDARTERECTOMY CAROTID LEFT;  Surgeon: Serafina Mitchell, MD;  Location: [facility];  Service: Vascular;  Laterality: Left;  . LEFT HEART CATH AND CORONARY ANGIOGRAPHY N/A 01/03/2019   Procedure: LEFT HEART CATH AND CORONARY ANGIOGRAPHY;  Surgeon: Belva Crome, MD;  Location: [facility];  Service: Cardiovascular;  Laterality: N/A;  . PATCH ANGIOPLASTY Left 01/04/2019  Procedure: PATCH ANGIOPLASTY USING XENOSURE BIOLOGIC PATCH;  Surgeon: Serafina Mitchell, MD;  Location: [facility] OR;  Service: Vascular;  Laterality: Left;     Current Meds  Medication Sig  . acetaminophen (TYLENOL) 325 MG tablet Take 650 mg by mouth every 6 (six) hours as needed for mild pain, fever or headache.  . albuterol (PROVENTIL) (2.5 MG/3ML)  0.083% nebulizer solution Use three times daily with nebulizer  . amLODipine (NORVASC) 5 MG tablet Take 5 mg by mouth daily.   Marland Kitchen aspirin EC 325 MG EC tablet Take 1 tablet (325 mg total) by mouth daily.  Marland Kitchen atorvastatin (LIPITOR) 80 MG tablet Take 80 mg by mouth daily at 6 PM.   . clotrimazole-betamethasone (LOTRISONE) cream Apply topically 2 (two) times daily. to affected area  . famotidine (PEPCID) 40 MG tablet Take 40 mg by mouth at bedtime.  Marland Kitchen levothyroxine (SYNTHROID, LEVOTHROID) 200 MCG tablet Take 200 mcg by mouth daily before breakfast. (in addition to the 75mcg tablet)  . levothyroxine (SYNTHROID, LEVOTHROID) 25 MCG tablet Take 25 mcg by mouth daily before breakfast. (in addition to the 274mcg tablet)  . lisinopril (PRINIVIL,ZESTRIL) 20 MG tablet Take 20 mg by mouth 2 (two) times daily.   Marland Kitchen omeprazole (PRILOSEC) 40 MG capsule Take 40 mg by mouth daily.  Marland Kitchen oxyCODONE-acetaminophen (PERCOCET) 7.5-325 MG tablet Take 1 tablet by mouth 3 (three) times daily.   . traZODone (DESYREL) 150 MG tablet Take 150 mg by mouth at bedtime.     Allergies:   Patient has no known allergies.   Social History   Tobacco Use  . Smoking status: Current Every Day Smoker    Packs/day: 2.00    Years: 40.00    Pack years: 80.00    Types: Cigarettes  . Smokeless tobacco: Never Used  Substance Use Topics  . Alcohol use: Not Currently    Frequency: Never  . Drug use: Never     Family Hx: The patient's family history includes Cancer in an other family member; Diabetes in an other family member; Heart disease in an other family member; High blood pressure in an other family member; Stroke in an other family member.  ROS:   Please see the history of present illness.    All other systems reviewed and are negative.   Prior CV studies:   The following studies were reviewed today:  Carotid doppler 01/2019 Summary: Right Carotid: Velocities in the right ICA are consistent with a 60-79% stenosis.  Left  Carotid: Velocities in the left ICA are consistent with a 1-39% stenosis.  Vertebrals: Bilateral vertebral arteries demonstrate antegrade flow.  Labs/Other Tests and Data Reviewed:    EKG:  An ECG dated 05/10/2019 was personally reviewed today and demonstrated:  NSR at rate of 61 bpm, QTC 490ms, read as non specific T wave abnormality (but hard to interpret given poor quality)   Recent Labs: 01/04/2019: ALT 22; Magnesium 1.7 01/05/2019: BUN 13; Creatinine, Ser 0.75; Hemoglobin 11.7; Platelets 220; Potassium 4.5; Sodium 132   Recent Lipid Panel Lab Results  Component Value Date/Time   CHOL 245 (H) 12/31/2018 05:29 AM   TRIG 117 12/31/2018 05:29 AM   HDL 39 (L) 12/31/2018 05:29 AM   CHOLHDL 6.3 12/31/2018 05:29 AM   LDLCALC 183 (H) 12/31/2018 05:29 AM    Wt Readings from Last 3 Encounters:  05/25/19 270 lb (122.5 kg)  01/23/19 260 lb (117.9 kg)  01/12/19 260 lb 6.4 oz (118.1 kg)     Objective:  Vital Signs:  BP (!) 151/89   Pulse 72   Ht 5\' 4"  (1.626 m)   Wt 270 lb (122.5 kg)   BMI 46.35 kg/m    VITAL SIGNS:  reviewed GEN:  no acute distress EYES:  sclerae anicteric, EOMI - Extraocular Movements Intact RESPIRATORY:  normal respiratory effort, symmetric expansion CARDIOVASCULAR:  no peripheral edema SKIN:  no rash, lesions or ulcers. MUSCULOSKELETAL:  no obvious deformities. NEURO:  alert and oriented x 3, no obvious focal deficit PSYCH:  normal affect  ASSESSMENT & PLAN:    1. Recurrently syncope Felt likely due to vasovagal in nature given poor PO intake. No recurrent episode since discharge. Intermittent palpitations. Will get monitor.   2. HTN Minimally elevated. Advised to keep log. No change.   3. HLD Continue statin.   4. Carotid artery disease s/p  s/p left ICA carotid endarterectomy on 12/30/2018 Doppler 01/2019 showed  60 to 79% right ICA stenosis and  1 to 39% left ICA stenosis.  Study at [facility] 04/2019 showed Carotid doppler with 70% R and 50% L  stenosis.  - Follow up with vascular as scheduled  5. Breast Cancer - Plan to start radiation.    COVID-19 Education: The signs and symptoms of COVID-19 were discussed with the patient and how to seek care for testing (follow up with PCP or arrange E-visit). The importance of social distancing was discussed today.  Time:   Today, I have spent 13 minutes with the patient with telehealth technology (8 minutes of video and 5 minutes on phone (switch due to connection issue)  discussing the above problems.     Medication Adjustments/Labs and Tests Ordered: Current medicines are reviewed at length with the patient today.  Concerns regarding medicines are outlined above.   Tests Ordered: No orders of the defined types were placed in this encounter.   Medication Changes: No orders of the defined types were placed in this encounter.   Disposition:  Follow up in 8 week(s)  Signed, Leanor Kail, PA  05/25/2019 10:10 AM    [place]

## 2019-05-25 NOTE — Telephone Encounter (Signed)
Enrolled patient for a 30 Day Preventcie Event Monitor to be mailed. Brief instructions were gone over with the patient and she knows to expect the monitor to arrive in 3-4 days

## 2019-06-02 ENCOUNTER — Telehealth: Payer: Self-pay | Admitting: Cardiovascular Disease

## 2019-06-02 ENCOUNTER — Ambulatory Visit (INDEPENDENT_AMBULATORY_CARE_PROVIDER_SITE_OTHER): Payer: Medicare Other

## 2019-06-02 DIAGNOSIS — R55 Syncope and collapse: Secondary | ICD-10-CM | POA: Diagnosis not present

## 2019-06-02 NOTE — Telephone Encounter (Signed)
OK with me.

## 2019-06-02 NOTE — Telephone Encounter (Signed)
LMVM When you go in for your radiation Tx, touch the pause button on your monitor cell phone . ( It is on the same screen as do not disturb. Remove your monitor and sticker. When you have completed your radiation tx. Remove the monitor battery from the used sticker and insert it into a fresh sticker.  Apply the new sticker to your chest. Touch resume on the cell phone. Select picture of your monniotn

## 2019-06-02 NOTE — Telephone Encounter (Signed)
New Message    Patient see's Dr. Acie Fredrickson in [place] but lives closer to [place] and would like to start seeing Dr. Bettina Gavia.  Just wanted to make sure it's okay with both of you before I schedule.

## 2019-06-02 NOTE — Telephone Encounter (Signed)
New Message     Patient has heart monitor but wants to know what she do when she goes in for her radiation treatment.

## 2019-06-06 NOTE — Telephone Encounter (Signed)
OK with me.

## 2019-07-05 ENCOUNTER — Other Ambulatory Visit: Payer: Self-pay

## 2019-07-06 ENCOUNTER — Telehealth: Payer: Self-pay | Admitting: *Deleted

## 2019-07-06 NOTE — Telephone Encounter (Signed)
-----   Message from [place], [place] sent at 07/06/2019  9:16 AM EDT ----- Sinus rhythm. No evidence of arrhythmias or significant bradycardia.

## 2019-07-06 NOTE — Telephone Encounter (Signed)
Call placed to pt to discuss her monitor results, left a message for her to call back.

## 2019-07-10 ENCOUNTER — Telehealth: Payer: Self-pay | Admitting: Cardiology

## 2019-07-10 NOTE — Telephone Encounter (Signed)
Patient called the office returning Jennifer's call about her monitor results. Please call the patient back to discuss. Thanks.

## 2019-07-10 NOTE — Telephone Encounter (Signed)
LVM for patient to call and reschedule appt due to [facility] not being in [place] on 8/11/dvw

## 2019-07-11 NOTE — Telephone Encounter (Signed)
Returned pts call and left another message for her to call back.

## 2019-07-20 ENCOUNTER — Telehealth: Payer: Self-pay | Admitting: *Deleted

## 2019-07-20 ENCOUNTER — Encounter: Payer: Self-pay | Admitting: *Deleted

## 2019-07-20 NOTE — Telephone Encounter (Signed)
After several attempts to reach pt re: monitor results, letter has been mailed.

## 2019-07-25 ENCOUNTER — Ambulatory Visit: Payer: Medicare Other | Admitting: Cardiology

## 2019-07-25 ENCOUNTER — Telehealth: Payer: Self-pay | Admitting: *Deleted

## 2019-07-25 NOTE — Telephone Encounter (Signed)
Pt received the letter to call re: heart monitor results.  Pt has been made aware of her results and verbalized understanding.

## 2019-08-03 ENCOUNTER — Encounter: Payer: Self-pay | Admitting: *Deleted

## 2019-08-03 ENCOUNTER — Ambulatory Visit (INDEPENDENT_AMBULATORY_CARE_PROVIDER_SITE_OTHER): Payer: Medicare Other | Admitting: Cardiology

## 2019-08-03 ENCOUNTER — Other Ambulatory Visit: Payer: Self-pay

## 2019-08-03 ENCOUNTER — Encounter: Payer: Self-pay | Admitting: Cardiology

## 2019-08-03 VITALS — BP 134/90 | HR 88 | Temp 97.9°F | Ht 64.0 in | Wt 276.0 lb

## 2019-08-03 DIAGNOSIS — R55 Syncope and collapse: Secondary | ICD-10-CM

## 2019-08-03 DIAGNOSIS — E7849 Other hyperlipidemia: Secondary | ICD-10-CM

## 2019-08-03 DIAGNOSIS — I10 Essential (primary) hypertension: Secondary | ICD-10-CM | POA: Diagnosis not present

## 2019-08-03 DIAGNOSIS — I2510 Atherosclerotic heart disease of native coronary artery without angina pectoris: Secondary | ICD-10-CM

## 2019-08-03 HISTORY — DX: Atherosclerotic heart disease of native coronary artery without angina pectoris: I25.10

## 2019-08-03 MED ORDER — CLONIDINE HCL 0.1 MG PO TABS: ORAL_TABLET | ORAL | 3 refills | Status: DC

## 2019-08-03 MED ORDER — ASPIRIN 81 MG PO CHEW: 81.0000 mg | CHEWABLE_TABLET | Freq: Once | ORAL | Status: DC

## 2019-08-03 MED ORDER — NITROGLYCERIN 0.4 MG SL SUBL: 0.4000 mg | SUBLINGUAL_TABLET | SUBLINGUAL | 11 refills | Status: DC | PRN

## 2019-08-03 MED ORDER — ASPIRIN EC 81 MG PO TBEC: 81.0000 mg | DELAYED_RELEASE_TABLET | Freq: Every day | ORAL | 3 refills | Status: AC

## 2019-08-03 NOTE — Patient Instructions (Addendum)
Medication Instructions:  Your physician has recommended you make the following change in your medication:   CHANGE your aspirin to 81mg  daily.  START Clonidine 0.1mg  (one tablet) as needed for blood pressures >180/100  Please SIT DOWN prior to taking this medication as it works quickly to lower your blood pressure.  START Nitroglycerin as needed for chest pain.   IF you have chest pain: sit down and place one tablet under your tongue, wait 5 minutes  IF chest pain persists after 5 minutes, put a second tablet under your tongue, wait 5 minutes  IF chest pain persists after another 5 minutes, put a third tablet under your tongue and call 911  If you need a refill on your cardiac medications before your next appointment, please call your pharmacy.   Lab work: None today. We will request labs from your PCP.  If you have labs (blood work) drawn today and your tests are completely normal, you will receive your results only by: Marland Kitchen MyChart Message (if you have MyChart) OR . A paper copy in the mail If you have any lab test that is abnormal or we need to change your treatment, we will call you to review the results.  Testing/Procedures: None today.  Follow-Up: At [facility], you and your health needs are our priority.  As part of our continuing mission to provide you with exceptional heart care, we have created designated Provider Care Teams.  These Care Teams include your primary Cardiologist (physician) and Advanced Practice Providers (APPs -  Physician Assistants and Nurse Practitioners) who all work together to provide you with the care you need, when you need it. You will need a follow up appointment in 1 years.    Any Other Special Instructions Will Be Listed Below (If Applicable).  Call our office if you have new symptoms of shortness of breath on exertion or chest pain.   Nitroglycerin sublingual tablets What is this medicine? NITROGLYCERIN (nye troe GLI ser in) is a type of  vasodilator. It relaxes blood vessels, increasing the blood and oxygen supply to your heart. This medicine is used to relieve chest pain caused by angina. It is also used to prevent chest pain before activities like climbing stairs, going outdoors in cold weather, or sexual activity. This medicine may be used for other purposes; ask your health care provider or pharmacist if you have questions. COMMON BRAND NAME(S): Nitroquick, Nitrostat, Nitrotab What should I tell my health care provider before I take this medicine? They need to know if you have any of these conditions:  anemia  head injury, recent stroke, or bleeding in the brain  liver disease  previous heart attack  an unusual or allergic reaction to nitroglycerin, other medicines, foods, dyes, or preservatives  pregnant or trying to get pregnant  breast-feeding How should I use this medicine? Take this medicine by mouth as needed. At the first sign of an angina attack (chest pain or tightness) place one tablet under your tongue. You can also take this medicine 5 to 10 minutes before an event likely to produce chest pain. Follow the directions on the prescription label. Let the tablet dissolve under the tongue. Do not swallow whole. Replace the dose if you accidentally swallow it. It will help if your mouth is not dry. Saliva around the tablet will help it to dissolve more quickly. Do not eat or drink, smoke or chew tobacco while a tablet is dissolving. If you are not better within 5 minutes after  taking ONE dose of nitroglycerin, call 9-1-1 immediately to seek emergency medical care. Do not take more than 3 nitroglycerin tablets over 15 minutes. If you take this medicine often to relieve symptoms of angina, your doctor or health care professional may provide you with different instructions to manage your symptoms. If symptoms do not go away after following these instructions, it is important to call 9-1-1 immediately. Do not take more than  3 nitroglycerin tablets over 15 minutes. Talk to your pediatrician regarding the use of this medicine in children. Special care may be needed. Overdosage: If you think you have taken too much of this medicine contact a poison control center or emergency room at once. NOTE: This medicine is only for you. Do not share this medicine with others. What if I miss a dose? This does not apply. This medicine is only used as needed. What may interact with this medicine? Do not take this medicine with any of the following medications:  certain migraine medicines like ergotamine and dihydroergotamine (DHE)  medicines used to treat erectile dysfunction like sildenafil, tadalafil, and vardenafil  riociguat This medicine may also interact with the following medications:  alteplase  aspirin  heparin  medicines for high blood pressure  medicines for mental depression  other medicines used to treat angina  phenothiazines like chlorpromazine, mesoridazine, prochlorperazine, thioridazine This list may not describe all possible interactions. Give your health care provider a list of all the medicines, herbs, non-prescription drugs, or dietary supplements you use. Also tell them if you smoke, drink alcohol, or use illegal drugs. Some items may interact with your medicine. What should I watch for while using this medicine? Tell your doctor or health care professional if you feel your medicine is no longer working. Keep this medicine with you at all times. Sit or lie down when you take your medicine to prevent falling if you feel dizzy or faint after using it. Try to remain calm. This will help you to feel better faster. If you feel dizzy, take several deep breaths and lie down with your feet propped up, or bend forward with your head resting between your knees. You may get drowsy or dizzy. Do not drive, use machinery, or do anything that needs mental alertness until you know how this drug affects you. Do  not stand or sit up quickly, especially if you are an older patient. This reduces the risk of dizzy or fainting spells. Alcohol can make you more drowsy and dizzy. Avoid alcoholic drinks. Do not treat yourself for coughs, colds, or pain while you are taking this medicine without asking your doctor or health care professional for advice. Some ingredients may increase your blood pressure. What side effects may I notice from receiving this medicine? Side effects that you should report to your doctor or health care professional as soon as possible:  blurred vision  dry mouth  skin rash  sweating  the feeling of extreme pressure in the head  unusually weak or tired Side effects that usually do not require medical attention (report to your doctor or health care professional if they continue or are bothersome):  flushing of the face or neck  headache  irregular heartbeat, palpitations  nausea, vomiting This list may not describe all possible side effects. Call your doctor for medical advice about side effects. You may report side effects to FDA at 1-800-FDA-1088. Where should I keep my medicine? Keep out of the reach of children. Store at room temperature between 20 and 25  degrees C (68 and 77 degrees F). Store in [employer]. Protect from light and moisture. Keep tightly closed. Throw away any unused medicine after the expiration date. NOTE: This sheet is a summary. It may not cover all possible information. If you have questions about this medicine, talk to your doctor, pharmacist, or health care provider.  2020 Elsevier/Gold Standard (2013-09-28 17:57:36) Clonidine tablets What is this medicine? CLONIDINE (KLOE ni deen) is used to treat high blood pressure. This medicine may be used for other purposes; ask your health care provider or pharmacist if you have questions. COMMON BRAND NAME(S): Catapres What should I tell my health care provider before I take this medicine? They  need to know if you have any of these conditions:  kidney disease  an unusual or allergic reaction to clonidine, other medicines, foods, dyes, or preservatives  pregnant or trying to get pregnant  breast-feeding How should I use this medicine? Take this medicine by mouth with a glass of water. Follow the directions on the prescription label. Take your doses at regular intervals. Do not take your medicine more often than directed. Do not suddenly stop taking this medicine. You must gradually reduce the dose or you may get a dangerous increase in blood pressure. Ask your doctor or health care professional for advice. Talk to your pediatrician regarding the use of this medicine in children. Special care may be needed. Overdosage: If you think you have taken too much of this medicine contact a poison control center or emergency room at once. NOTE: This medicine is only for you. Do not share this medicine with others. What if I miss a dose? If you miss a dose, take it as soon as you can. If it is almost time for your next dose, take only that dose. Do not take double or extra doses. What may interact with this medicine? Do not take this medicine with any of the following medications:  MAOIs like Carbex, Eldepryl, Marplan, Nardil, and Parnate This medicine may also interact with the following medications:  barbiturate medicines for inducing sleep or treating seizures like phenobarbital  certain medicines for blood pressure, heart disease, irregular heart beat  certain medicines for depression, anxiety, or psychotic disturbances  prescription pain medicines This list may not describe all possible interactions. Give your health care provider a list of all the medicines, herbs, non-prescription drugs, or dietary supplements you use. Also tell them if you smoke, drink alcohol, or use illegal drugs. Some items may interact with your medicine. What should I watch for while using this  medicine? Visit your doctor or health care professional for regular checks on your progress. Check your heart rate and blood pressure regularly while you are taking this medicine. Ask your doctor or health care professional what your heart rate should be and when you should contact him or her. You may get drowsy or dizzy. Do not drive, use machinery, or do anything that needs mental alertness until you know how this medicine affects you. To avoid dizzy or fainting spells, do not stand or sit up quickly, especially if you are an older person. Alcohol can make you more drowsy and dizzy. Avoid alcoholic drinks. Your mouth may get dry. Chewing sugarless gum or sucking hard candy, and drinking plenty of water will help. Do not treat yourself for coughs, colds, or pain while you are taking this medicine without asking your doctor or health care professional for advice. Some ingredients may increase your blood pressure. If you are  going to have surgery tell your doctor or health care professional that you are taking this medicine. What side effects may I notice from receiving this medicine? Side effects that you should report to your doctor or health care professional as soon as possible:  allergic reactions like skin rash, itching or hives, swelling of the face, lips, or tongue  anxiety, nervousness  chest pain  depression  fast, irregular heartbeat  swelling of feet or legs  unusually weak or tired Side effects that usually do not require medical attention (report to your doctor or health care professional if they continue or are bothersome):  change in sex drive or performance  constipation  headache This list may not describe all possible side effects. Call your doctor for medical advice about side effects. You may report side effects to FDA at 1-800-FDA-1088. Where should I keep my medicine? Keep out of the reach of children. Store at room temperature between 15 and 30 degrees C (59 and  86 degrees F). Protect from light. Keep container tightly closed. Throw away any unused medicine after the expiration date. NOTE: This sheet is a summary. It may not cover all possible information. If you have questions about this medicine, talk to your doctor, pharmacist, or health care provider.  2020 Elsevier/Gold Standard (2011-05-27 13:01:28)

## 2019-08-03 NOTE — Progress Notes (Signed)
Cardiology Office Note:    Date:  08/03/2019   ID:  Madison Medina, DOB 05-Feb-1961, MRN [id number]  PCP:  Madison Roche, NP  Cardiologist:  Madison More, MD    Referring MD: Madison Roche, NP    ASSESSMENT:    1. Syncope and collapse   2. Essential hypertension   3. Familial combined hyperlipidemia   4. Nonobstructive atherosclerosis of coronary artery    PLAN:    In order of problems listed above:  1. Syncope and collapse - No recurrence. Orthostatic vital signs negative today. 2. HTN - Checks at home with arm cuff. Stable today. Reports some elevated readings at home such as 130s/108. Will give new prescription for as needed Clonidine 0.1mg  for BP >130s/108.  3. HLD - Tells me her PCP recently checked labs and we will request them. Continue high intensity statin.  4. Carotid stenosis s/p L ICA carotid endarterectomy 12/30/18 - No TIA like symptoms. Change aspirin to 81mg  daily. Continue high intensity statin.  5. Breast cancer - Recently completed radiation. We discussed need for cardiology follow up due to increased risk of CAD in setting of cancer. She verbalized understanding of symptoms to call our office with including new or worsening DOE and/or chest pain.  6. Palpitations - Reports they are very intermittent. Not associated with chest pain nor shortness of breath. Politely declines Zio monitor.  7. Mild nonobstructive coronary artery disease - Cath 12/2018 with LAD mid vessel 40-50% narrowing, RCA 40%, PDA 60%, LVEF >60%. She denies chest pain, no anginal symptoms. Continue aspirin 81mg , statin, calcium channel blocker. Would not add beta blocker as her BP and HR are well controlled. Will give Rx for PRN Nitroglycerin.    Next appointment: 1 year   Medication Adjustments/Labs and Tests Ordered: Current medicines are reviewed at length with the patient today.  Concerns regarding medicines are outlined above.  No orders of the defined types were placed in this encounter.   Meds ordered this encounter  Medications  . DISCONTD: aspirin chewable tablet 81 mg  . aspirin EC 81 MG tablet    Sig: Take 1 tablet (81 mg total) by mouth daily.    Dispense:  90 tablet    Refill:  3  . cloNIDine (CATAPRES) 0.1 MG tablet    Sig: Take 1 tablet daily if your systolic BP (top number) is greater than 180.    Dispense:  30 tablet    Refill:  3  . nitroGLYCERIN (NITROSTAT) 0.4 MG SL tablet    Sig: Place 1 tablet (0.4 mg total) under the tongue every 5 (five) minutes as needed for chest pain.    Dispense:  25 tablet    Refill:  11    Chief Complaint  Patient presents with  . Follow-up  . Loss of Consciousness    History of Present Illness:    Madison Medina is a 58 y.o. female with a hx of CAD hypertension, obesity, hyperlipidemia, hypothyroidism, COPD, left cerebellar hemisphere infarcts s/p left ICA carotid endarterectomy on 12/30/2018 and recent admission for low BP/syncope  last seen 05/25/2019. Admitted 5/26-5/28/20 for syncope from cancer center while being evaluated on exam table. Felt dizzy when standing.  It was noted that patient had 55-month history of dizziness and lightheadedness while standing from sitting or laying position.  However no prior syncope episode. Poor PO intake. Felt orthostatic in nature. CT of head negative. MRI of brain without acute infract. Carotid doppler with 70% R and 50% L stenosis.  EKG with stable QT prolongation. Echo showed LVEF. Symptoms improved with IV fluids. Discontinued HCTZ. Continued Amlodipine and Lisinopril   Reports feeling well. Recently finished her radiation treatments to bilateral breast (no chemotherapy) and is grateful to be done. Reports check her BP at home with arm cuff with some elevated diastolic readings sometimes, I.e. 130s/108. No recurrent syncope episodes - will occasionally get light headed with position changes.   She reports intermittent palpitations that are non bothersome and she describes as an awareness  of her heart. These are not associated with SOB nor chest pain.   Tells me she follows closely with her PCP and has labs approximately every 3 months.   Compliance with diet, lifestyle and medications: Yes Past Medical History:  Diagnosis Date  . Asthma   . Back pain   . Carotid artery occlusion   . COPD (chronic obstructive pulmonary disease) ([place])   . Hyperlipidemia   . Hypertension   . Hypothyroidism   . Stroke [facility])     Past Surgical History:  Procedure Laterality Date  . BREAST BIOPSY  12/2018   at breast center in [place]  . CARDIAC CATHETERIZATION    . CAROTID ENDARTERECTOMY    . CARPAL TUNNEL RELEASE    . CYST EXCISION    . ENDARTERECTOMY Left 01/04/2019   Procedure: ENDARTERECTOMY CAROTID LEFT;  Surgeon: Serafina Mitchell, MD;  Location: [facility];  Service: Vascular;  Laterality: Left;  . LEFT HEART CATH AND CORONARY ANGIOGRAPHY N/A 01/03/2019   Procedure: LEFT HEART CATH AND CORONARY ANGIOGRAPHY;  Surgeon: Belva Crome, MD;  Location: [place] CV LAB;  Service: Cardiovascular;  Laterality: N/A;  . PATCH ANGIOPLASTY Left 01/04/2019   Procedure: PATCH ANGIOPLASTY USING Rueben Bash BIOLOGIC PATCH;  Surgeon: Serafina Mitchell, MD;  Location: [facility] OR;  Service: Vascular;  Laterality: Left;    Current Medications: Current Meds  Medication Sig  . acetaminophen (TYLENOL) 325 MG tablet Take 650 mg by mouth every 6 (six) hours as needed for mild pain, fever or headache.  . albuterol (PROVENTIL) (2.5 MG/3ML) 0.083% nebulizer solution Use three times daily with nebulizer  . amLODipine (NORVASC) 5 MG tablet Take 5 mg by mouth daily.   Marland Kitchen atorvastatin (LIPITOR) 80 MG tablet Take 80 mg by mouth daily at 6 PM.   . clotrimazole-betamethasone (LOTRISONE) cream Apply topically 2 (two) times daily. to affected area  . famotidine (PEPCID) 40 MG tablet Take 40 mg by mouth at bedtime.  Marland Kitchen levothyroxine (SYNTHROID, LEVOTHROID) 200 MCG tablet Take 200 mcg by mouth daily before breakfast. (in  addition to the 26mcg tablet)  . levothyroxine (SYNTHROID, LEVOTHROID) 25 MCG tablet Take 25 mcg by mouth daily before breakfast. (in addition to the 267mcg tablet)  . lisinopril (PRINIVIL,ZESTRIL) 20 MG tablet Take 20 mg by mouth 2 (two) times daily.   Marland Kitchen omeprazole (PRILOSEC) 40 MG capsule Take 40 mg by mouth daily.  Marland Kitchen oxyCODONE-acetaminophen (PERCOCET) 7.5-325 MG tablet Take 1 tablet by mouth 3 (three) times daily.   . traZODone (DESYREL) 150 MG tablet Take 150 mg by mouth at bedtime.  . [DISCONTINUED] aspirin EC 325 MG EC tablet Take 1 tablet (325 mg total) by mouth daily.     Allergies:   Patient has no known allergies.   Social History   Socioeconomic History  . Marital status: Married    Spouse name: Not on file  . Number of children: Not on file  . Years of education: Not on file  . Highest  education level: Not on file  Occupational History  . Not on file  Social Needs  . Financial resource strain: Not on file  . Food insecurity    Worry: Not on file    Inability: Not on file  . Transportation needs    Medical: Not on file    Non-medical: Not on file  Tobacco Use  . Smoking status: Current Every Day Smoker    Packs/day: 2.00    Years: 40.00    Pack years: 80.00    Types: Cigarettes  . Smokeless tobacco: Never Used  Substance and Sexual Activity  . Alcohol use: Not Currently    Frequency: Never  . Drug use: Never  . Sexual activity: Not on file  Lifestyle  . Physical activity    Days per week: Not on file    Minutes per session: Not on file  . Stress: Not on file  Relationships  . Social Herbalist on phone: Not on file    Gets together: Not on file    Attends religious service: Not on file    Active member of club or organization: Not on file    Attends meetings of clubs or organizations: Not on file    Relationship status: Not on file  Other Topics Concern  . Not on file  Social History Narrative  . Not on file     Family History: The  patient's family history includes Cancer in an other family member; Diabetes in her brother, father, mother, and another family member; Heart disease in her brother, father, mother, sister, and another family member; High blood pressure in an other family member; Stroke in an other family member. ROS:   Review of Systems  Constitution: Negative for chills, fever and malaise/fatigue.  Cardiovascular: Positive for palpitations. Negative for chest pain, dyspnea on exertion, irregular heartbeat, leg swelling, near-syncope and syncope.  Respiratory: Negative for cough, shortness of breath and wheezing.   Gastrointestinal: Negative for nausea and vomiting.  Neurological: Positive for light-headedness. Negative for dizziness and weakness.    Please see the history of present illness.    All other systems reviewed and are negative.  EKGs/Labs/Other Studies Reviewed:    The following studies were reviewed today:  EKG:  No EKG today.  LEFT HEART CATH AND CORONARY ANGIOGRAPHY 01/03/2019  Conclusion  Widely patent left main  LAD is tortuous and contains segmental mid vessel 40 to 50% narrowing.  No focal high-grade obstruction is noted.  Second diagonal is large and also free of significant obstruction.  A large ramus intermedius branch contains mid vessel irregularity but no high-grade obstruction.  The circumflex system is relatively small in distribution.  No significant obstruction.  The right coronary is dominant.  Diffuse irregularities up to 40% from proximal to distal vessel.  PDA contains eccentric 60% narrowing in the mid third.  LVEF greater than 60% with EDP greater than 25 to 30 mmHg.   Recent Labs: 01/04/2019: ALT 22; Magnesium 1.7 01/05/2019: BUN 13; Creatinine, Ser 0.75; Hemoglobin 11.7; Platelets 220; Potassium 4.5; Sodium 132  Recent Lipid Panel    Component Value Date/Time   CHOL 245 (H) 12/31/2018 0529   TRIG 117 12/31/2018 0529   HDL 39 (L) 12/31/2018 0529   CHOLHDL  6.3 12/31/2018 0529   VLDL 23 12/31/2018 0529   LDLCALC 183 (H) 12/31/2018 0529    Physical Exam:    VS:  BP 134/90 (BP Location: Right Arm, Patient Position: Sitting, Cuff Size: Large)  Pulse 88   Temp 97.9 F (36.6 C)   Ht 5\' 4"  (1.626 m)   Wt 276 lb (125.2 kg)   SpO2 97%   BMI 47.38 kg/m     Wt Readings from Last 3 Encounters:  08/03/19 276 lb (125.2 kg)  05/25/19 270 lb (122.5 kg)  01/23/19 260 lb (117.9 kg)     GEN:  Well nourished, overweight, well developed in no acute distress HEENT: Normal NECK: No JVD; No carotid bruits LYMPHATICS: No lymphadenopathy CARDIAC: RRR, no murmurs, rubs, gallops RESPIRATORY:  Clear to auscultation without rales, wheezing or rhonchi  ABDOMEN: Soft, non-tender, non-distended MUSCULOSKELETAL:  No edema; No deformity  SKIN: Warm and dry, multiple healed scars to bilateral UE NEUROLOGIC:  Alert and oriented x 3 PSYCHIATRIC:  Normal affect    Signed, Madison More, MD  08/03/2019 3:49 PM    [facility]

## 2019-08-17 DIAGNOSIS — C50411 Malignant neoplasm of upper-outer quadrant of right female breast: Secondary | ICD-10-CM

## 2019-08-17 DIAGNOSIS — C50412 Malignant neoplasm of upper-outer quadrant of left female breast: Secondary | ICD-10-CM

## 2019-10-18 ENCOUNTER — Other Ambulatory Visit: Payer: Self-pay

## 2019-10-18 ENCOUNTER — Encounter: Payer: Self-pay | Admitting: Adult Health

## 2019-10-18 ENCOUNTER — Ambulatory Visit: Payer: Medicare Other | Admitting: Adult Health

## 2019-10-18 VITALS — BP 131/79 | HR 87 | Temp 98.0°F | Ht 64.0 in | Wt 274.6 lb

## 2019-10-18 DIAGNOSIS — E785 Hyperlipidemia, unspecified: Secondary | ICD-10-CM

## 2019-10-18 DIAGNOSIS — I6522 Occlusion and stenosis of left carotid artery: Secondary | ICD-10-CM

## 2019-10-18 DIAGNOSIS — I6521 Occlusion and stenosis of right carotid artery: Secondary | ICD-10-CM

## 2019-10-18 DIAGNOSIS — Z8673 Personal history of transient ischemic attack (TIA), and cerebral infarction without residual deficits: Secondary | ICD-10-CM

## 2019-10-18 DIAGNOSIS — I10 Essential (primary) hypertension: Secondary | ICD-10-CM | POA: Diagnosis not present

## 2019-10-18 NOTE — Progress Notes (Signed)
[facility] [address] [place]. [place] [address] [phone]       OFFICE FOLLOW UP NOTE  Ms. Madison Medina Date of Birth:  [birth year] Medical Record Number:  [id number]   Referring MD:  Lilia Pro Reason for Referral:  Carotid stenosis and cerebellar stroke  Chief Complaint  Patient presents with   Follow-up    Treatment room, with daughter in law. No changes no concerns.     HPI:   Initial visit 01/12/2019 PS: [name] is a 58 year old female with PMH of HTN, obesity, HLD, hypothyroid, COPD, tobacco abuse, transferred to [facility] from [facility] on 12/30/2018 for acute CVA. The morning of January 15 she had a breast biopsy at [facility] and around that time she had acute onset of right upper extremity tingling and numbness and shortly thereafter developed right lower extremity weakness. The next morning around 9 AM family noticed that patient was mumbling and had garbled speech but she herself was unaware of her speech and was able to understand what people said. However later that day she became confused. She was admitted for acute embolic stroke. Neurology was consulted. She completed stroke work-up.  CT scan of the head showed only mild white matter changes.  MRI scan showed patchy acute infarcts involving left cerebral hemisphere in the deep watershed zone.  CT angiogram of the head and neck showed severe proximal left ICA near occlusion with threadlike stenosis.  Right proximal ICA also showed severe 70% stenosis with plaque.  Transthoracic echo showed normal ejection fraction.  LDL cholesterol was 183 mg percent.  Hemoglobin A1c was 5.7.  Patient was started on aspirin and counseled to quit smoking. Her stroke was felt to be due to severe carotid artery stenosis. After cardiology performed cardiac cath and cleared her for procedure, She underwent carotid endarterectomy by vascular surgery on 01/04/2018.  And did well.  Patient is breast biopsy was positive for  cancer but the surgeon feels that she needs bilateral lumpectomy and has an excellent prognosis and is interested in doing the procedure but wants neurological clearance for the same.  Virtual visit 04/17/2019: Ms. Madison Medina is being seen today via virtual visit due to COVID-19 safety restrictions for follow-up regarding prior stroke.  She has been stable from a stroke standpoint without residual deficits or recurring symptoms.  She does have symptoms of 2-week left mid forearm tingling which occurs 2-3 times per week and only last for couple minutes.  She has not experienced these symptoms prior.  Denies any other deficits such as weakness in upper or lower extremities, no other areas of numbness/tingling or headache.  Underlying history of neck pain but denies worsening or any recent trauma.  She continues on aspirin 325 mg without side effects of bleeding or bruising.  Continues on atorvastatin 80 mg without side effects myalgias.  Blood pressure typically 140s/80s.  she did have follow-up visit with vascular surgery on 01/23/2019 with repeat carotid duplex showing right ICA 60 to 79% stenosis and left ICA 1 to 39% stenosis.  Recommended to follow-up in 6 months with a repeat ultrasound.  No further concerns at this time.  Denies new or worsening stroke/TIA symptoms.  Update 10/18/2019: Ms. Madison Medina is a 58 year old female who is being seen today for stroke follow-up accompanied by daughter-in-law.  She has been stable from a stroke standpoint without reoccurring or new stroke/TIA symptoms.  Continues on aspirin 81 mg and atorvastatin for secondary stroke prevention without side effects.  Blood pressure today 131/79.  It was recommended to follow-up with Dr. Trula Slade for repeat carotid ultrasound in August but she states she has not contacted to schedule follow-up visit.  Continues to follow with cardiology.  No further concerns at this time.     ROS:   14 system review of systems is positive for no concerns and  all other systems negative  PMH:  Past Medical History:  Diagnosis Date   Asthma    Back pain    Carotid artery occlusion    COPD (chronic obstructive pulmonary disease) ([facility])    Hyperlipidemia    Hypertension    Hypothyroidism    Stroke [facility])     Social History:  Social History   Socioeconomic History   Marital status: Married    Spouse name: Not on file   Number of children: Not on file   Years of education: Not on file   Highest education level: Not on file  Occupational History   Not on file  Social Needs   Financial resource strain: Not on file   Food insecurity    Worry: Not on file    Inability: Not on file   Transportation needs    Medical: Not on file    Non-medical: Not on file  Tobacco Use   Smoking status: Current Every Day Smoker    Packs/day: 2.00    Years: 40.00    Pack years: 80.00    Types: Cigarettes   Smokeless tobacco: Never Used  Substance and Sexual Activity   Alcohol use: Not Currently    Frequency: Never   Drug use: Never   Sexual activity: Not on file  Lifestyle   Physical activity    Days per week: Not on file    Minutes per session: Not on file   Stress: Not on file  Relationships   Social connections    Talks on phone: Not on file    Gets together: Not on file    Attends religious service: Not on file    Active member of club or organization: Not on file    Attends meetings of clubs or organizations: Not on file    Relationship status: Not on file   Intimate partner violence    Fear of current or ex partner: Not on file    Emotionally abused: Not on file    Physically abused: Not on file    Forced sexual activity: Not on file  Other Topics Concern   Not on file  Social History Narrative   Not on file    Medications:   Current Outpatient Medications on File Prior to Visit  Medication Sig Dispense Refill   acetaminophen (TYLENOL) 325 MG tablet Take 650 mg by mouth every 6 (six) hours as  needed for mild pain, fever or headache.     albuterol (PROVENTIL) (2.5 MG/3ML) 0.083% nebulizer solution Use three times daily with nebulizer     amLODipine (NORVASC) 5 MG tablet Take 5 mg by mouth daily.      aspirin EC 81 MG tablet Take 1 tablet (81 mg total) by mouth daily. 90 tablet 3   atorvastatin (LIPITOR) 80 MG tablet Take 80 mg by mouth daily at 6 PM.      cloNIDine (CATAPRES) 0.1 MG tablet Take 1 tablet daily if your systolic BP (top number) is greater than 180. 30 tablet 3   clotrimazole-betamethasone (LOTRISONE) cream Apply topically 2 (two) times daily. to affected area     famotidine (PEPCID) 40 MG tablet Take  40 mg by mouth at bedtime.     levothyroxine (SYNTHROID, LEVOTHROID) 200 MCG tablet Take 200 mcg by mouth daily before breakfast. (in addition to the 42mcg tablet)     levothyroxine (SYNTHROID, LEVOTHROID) 25 MCG tablet Take 25 mcg by mouth daily before breakfast. (in addition to the 246mcg tablet)     lisinopril (PRINIVIL,ZESTRIL) 20 MG tablet Take 20 mg by mouth 2 (two) times daily.      nitroGLYCERIN (NITROSTAT) 0.4 MG SL tablet Place 1 tablet (0.4 mg total) under the tongue every 5 (five) minutes as needed for chest pain. 25 tablet 11   omeprazole (PRILOSEC) 40 MG capsule Take 40 mg by mouth daily.     oxyCODONE-acetaminophen (PERCOCET) 7.5-325 MG tablet Take 1 tablet by mouth 3 (three) times daily.      traZODone (DESYREL) 150 MG tablet Take 150 mg by mouth at bedtime.     No current facility-administered medications on file prior to visit.     Allergies:  No Known Allergies  Today's Vitals   10/18/19 1020  BP: 131/79  Pulse: 87  Temp: 98 F (36.7 C)  Weight: 274 lb 9.6 oz (124.6 kg)  Height: 5\' 4"  (1.626 m)   Body mass index is 47.13 kg/m.  General: Obese pleasant middle-age Caucasian female, seated, in no evident distress Head: head normocephalic and atraumatic.   Neck: supple with no carotid or supraclavicular bruits Cardiovascular:  regular rate and rhythm, no murmurs Musculoskeletal: no deformity Skin:  no rash/petichiae Vascular:  Normal pulses all extremities   Neurologic Exam Mental Status: Awake and fully alert. Oriented to place and time. Recent and remote memory intact. Attention span, concentration and fund of knowledge appropriate. Mood and affect appropriate.  Cranial Nerves: Pupils equal, briskly reactive to light. Extraocular movements full without nystagmus. Visual fields full to confrontation. Hearing intact. Facial sensation intact. Face, tongue, palate moves normally and symmetrically.  Motor: Normal bulk and tone. Normal strength in all tested extremity muscles. Sensory.: intact to touch , pinprick , position and vibratory sensation.  Coordination: Rapid alternating movements normal in all extremities. Finger-to-nose performed accurately and heel-to-shin slight difficulty but no ataxia or dysmetria. Gait and Station: Arises from chair without difficulty. Stance is normal. Gait demonstrates normal stride length with slight waddling gait and no evidence of balance difficulties Reflexes: 1+ and symmetric. Toes downgoing.     ASSESSMENT: Madison Medina is a 58 year old Caucasian lady with left cerebellar hemisphere infarcts and symptomatic high-grade left carotid stenosis in January 2020 s/p left ICA endarterectomy.  Multiple vascular risk factors of moderate right ICA stenosis, smoking, obesity, hypertension and hyperlipidemia.  Stable from neurological standpoint without new or recurring stroke/TIA symptoms.  No residual deficits.   PLAN: -Continue aspirin 81 mg daily and atorvastatin 80 mg daily -Continue to follow with PCP for HLD and HTN management  -Advised to schedule follow-up visit with vascular surgery Dr. Trula Slade and provided office number -Continue to monitor blood pressure at home -Continue stay active and maintain healthy diet - Maintain strict control of hypertension with blood pressure goal  below 130/90 with avoidance of hypotension, diabetes with hemoglobin A1c goal below 6.5% and cholesterol with LDL cholesterol (bad cholesterol) goal below 70 mg/dL.  I also advised the patient to eat a healthy diet with plenty of whole grains, cereals, fruits and vegetables, exercise regularly with at least 30 minutes of continuous activity daily and maintain ideal body weight.  Overall stable from stroke standpoint recommend follow-up as needed  Better than 50% time during this 25-minute visit was spent discussing prior stroke, indication for vascular surgery follow-up and importance of scheduling follow-up visit, discussion about stroke prevention and need for aggressive risk factor modification and answered questions to patient and daughter-in-law satisfaction  Frann Rider, AGNP-BC  [facility] [address] [place], [place] [address]  Phone [phone] Fax [phone] Note: This document was prepared with digital dictation and possible smart phrase technology. Any transcriptional errors that result from this process are unintentional.

## 2019-10-18 NOTE — Patient Instructions (Signed)
Schedule follow-up with Dr. Trula Slade with repeat carotid ultrasound as recommended Office number: [phone]  Continue aspirin 81 mg daily  and Lipitor for secondary stroke prevention  Continue to follow up with PCP regarding cholesterol and blood pressure management   Continue to monitor blood pressure at home  Maintain strict control of hypertension with blood pressure goal below 130/90, diabetes with hemoglobin A1c goal below 6.5% and cholesterol with LDL cholesterol (bad cholesterol) goal below 70 mg/dL. I also advised the patient to eat a healthy diet with plenty of whole grains, cereals, fruits and vegetables, exercise regularly and maintain ideal body weight.         Thank you for coming to see [place] at [facility]. I hope we have been able to provide you high quality care today.  You may receive a patient satisfaction survey over the next few weeks. We would appreciate your feedback and comments so that we may continue to improve ourselves and the health of our patients.

## 2019-10-23 NOTE — Progress Notes (Signed)
I agree with the above plan 

## 2019-11-22 DIAGNOSIS — C50912 Malignant neoplasm of unspecified site of left female breast: Secondary | ICD-10-CM | POA: Insufficient documentation

## 2019-11-22 DIAGNOSIS — C50911 Malignant neoplasm of unspecified site of right female breast: Secondary | ICD-10-CM

## 2019-11-22 HISTORY — DX: Morbid (severe) obesity due to excess calories: E66.01

## 2019-11-22 HISTORY — DX: Malignant neoplasm of unspecified site of right female breast: C50.911

## 2019-11-27 ENCOUNTER — Ambulatory Visit: Payer: Medicare Other | Admitting: Cardiology

## 2020-02-15 DIAGNOSIS — C50411 Malignant neoplasm of upper-outer quadrant of right female breast: Secondary | ICD-10-CM

## 2020-02-15 DIAGNOSIS — C50412 Malignant neoplasm of upper-outer quadrant of left female breast: Secondary | ICD-10-CM

## 2020-05-16 ENCOUNTER — Other Ambulatory Visit: Payer: Self-pay | Admitting: *Deleted

## 2020-05-16 DIAGNOSIS — I6523 Occlusion and stenosis of bilateral carotid arteries: Secondary | ICD-10-CM

## 2020-05-17 DIAGNOSIS — C50411 Malignant neoplasm of upper-outer quadrant of right female breast: Secondary | ICD-10-CM

## 2020-05-20 ENCOUNTER — Other Ambulatory Visit: Payer: Self-pay

## 2020-05-20 ENCOUNTER — Ambulatory Visit (INDEPENDENT_AMBULATORY_CARE_PROVIDER_SITE_OTHER): Payer: Medicare Other | Admitting: Physician Assistant

## 2020-05-20 ENCOUNTER — Ambulatory Visit (HOSPITAL_COMMUNITY)
Admission: RE | Admit: 2020-05-20 | Discharge: 2020-05-20 | Disposition: A | Discharge status: Home / Self Care | Payer: Medicare Other | Source: Ambulatory Visit | Attending: Surgery | Admitting: Surgery

## 2020-05-20 VITALS — BP 191/95 | HR 95 | Temp 98.2°F | Resp 16 | Ht 64.0 in | Wt 273.0 lb

## 2020-05-20 DIAGNOSIS — I6523 Occlusion and stenosis of bilateral carotid arteries: Secondary | ICD-10-CM | POA: Diagnosis present

## 2020-05-20 NOTE — Progress Notes (Signed)
History of Present Illness:  Patient is a 59 y.o. year old female who presents for evaluation of carotid stenosis.  S/P Left carotid CEA by Dr. Trula Slade 01/04/2019.    Symptoms related to this stenosis include slurred speech and right arm weakness.  She was found to have an acute stroke on MRI.  CT scan of the neck revealed possible near occlusion of the left carotid artery 90% stenosis of the right.    The patient denies symptoms of TIA, amaurosis, or stroke.  The patient is currently on aspirin antiplatelet therapy.    Past Medical History:  Diagnosis Date  . Asthma   . Back pain   . Carotid artery occlusion   . COPD (chronic obstructive pulmonary disease) ([place])   . Hyperlipidemia   . Hypertension   . Hypothyroidism   . Stroke [facility])     Past Surgical History:  Procedure Laterality Date  . BREAST BIOPSY  12/2018   at breast center in [place]  . CARDIAC CATHETERIZATION    . CAROTID ENDARTERECTOMY    . CARPAL TUNNEL RELEASE    . CYST EXCISION    . ENDARTERECTOMY Left 01/04/2019   Procedure: ENDARTERECTOMY CAROTID LEFT;  Surgeon: Serafina Mitchell, MD;  Location: [place];  Service: Vascular;  Laterality: Left;  . LEFT HEART CATH AND CORONARY ANGIOGRAPHY N/A 01/03/2019   Procedure: LEFT HEART CATH AND CORONARY ANGIOGRAPHY;  Surgeon: Belva Crome, MD;  Location: [place] CV [facility];  Service: Cardiovascular;  Laterality: N/A;  . PATCH ANGIOPLASTY Left 01/04/2019   Procedure: PATCH ANGIOPLASTY USING Rueben Bash BIOLOGIC PATCH;  Surgeon: Serafina Mitchell, MD;  [facility];  Service: Vascular;  Laterality: Left;     Social History Social History   Tobacco Use  . Smoking status: Current Every Day Smoker    Packs/day: 2.00    Years: 40.00    Pack years: 80.00    Types: Cigarettes  . Smokeless tobacco: Never Used  Substance Use Topics  . Alcohol use: Not Currently  . Drug use: Never    Family History Family History  Problem Relation Age of Onset  . High blood pressure Other    . Diabetes Other   . Cancer Other   . Stroke Other   . Heart disease Other   . Heart disease Mother   . Diabetes Mother   . Heart disease Father   . Diabetes Father   . Heart disease Sister   . Heart disease Brother   . Diabetes Brother     Allergies  No Known Allergies   Current Outpatient Medications  Medication Sig Dispense Refill  . acetaminophen (TYLENOL) 325 MG tablet Take 650 mg by mouth every 6 (six) hours as needed for mild pain, fever or headache.    . albuterol (PROVENTIL) (2.5 MG/3ML) 0.083% nebulizer solution Use three times daily with nebulizer    . amLODipine (NORVASC) 5 MG tablet Take 5 mg by mouth daily.     Marland Kitchen aspirin EC 81 MG tablet Take 1 tablet (81 mg total) by mouth daily. 90 tablet 3  . atorvastatin (LIPITOR) 80 MG tablet Take 80 mg by mouth daily at 6 PM.     . cloNIDine (CATAPRES) 0.1 MG tablet Take 1 tablet daily if your systolic BP (top number) is greater than 180. 30 tablet 3  . clotrimazole-betamethasone (LOTRISONE) cream Apply topically 2 (two) times daily. to affected area    . famotidine (PEPCID) 40 MG tablet Take 40 mg by mouth  at bedtime.    Marland Kitchen levothyroxine (SYNTHROID, LEVOTHROID) 200 MCG tablet Take 200 mcg by mouth daily before breakfast. (in addition to the 45mcg tablet)    . levothyroxine (SYNTHROID, LEVOTHROID) 25 MCG tablet Take 25 mcg by mouth daily before breakfast. (in addition to the 29mcg tablet)    . lisinopril (PRINIVIL,ZESTRIL) 20 MG tablet Take 20 mg by mouth 2 (two) times daily.     Marland Kitchen omeprazole (PRILOSEC) 40 MG capsule Take 40 mg by mouth daily.    Marland Kitchen oxyCODONE-acetaminophen (PERCOCET) 7.5-325 MG tablet Take 1 tablet by mouth 3 (three) times daily.     . traZODone (DESYREL) 150 MG tablet Take 150 mg by mouth at bedtime.    . nitroGLYCERIN (NITROSTAT) 0.4 MG SL tablet Place 1 tablet (0.4 mg total) under the tongue every 5 (five) minutes as needed for chest pain. 25 tablet 11   No current facility-administered medications for this  visit.    ROS:   General:  No weight loss, Fever, chills  HEENT: No recent headaches, no nasal bleeding, no visual changes, no sore throat  Neurologic: No dizziness, blackouts, seizures. No recent symptoms of stroke or mini- stroke. No recent episodes of slurred speech, or temporary blindness.  Cardiac: No recent episodes of chest pain/pressure, no shortness of breath at rest.  No shortness of breath with exertion.  Denies history of atrial fibrillation or irregular heartbeat  Vascular: No history of rest pain in feet.  No history of claudication.  No history of non-healing ulcer, No history of DVT   Pulmonary: No home oxygen, no productive cough, no hemoptysis,  No asthma or wheezing Hx of COPD  Musculoskeletal:  [ ]  Arthritis, [ ]  Low back pain,  [ ]  Joint pain  Hematologic:No history of hypercoagulable state.  No history of easy bleeding.  No history of anemia  Gastrointestinal: No hematochezia or melena,  No gastroesophageal reflux, no trouble swallowing  Urinary: [ ]  chronic Kidney disease, [ ]  on HD - [ ]  MWF or [ ]  TTHS, [ ]  Burning with urination, [ ]  Frequent urination, [ ]  Difficulty urinating;   Skin: No rashes  Psychological: No history of anxiety,  No history of depression   Physical Examination  Vitals:   05/20/20 1147 05/20/20 1150 05/20/20 1151  BP: (!) 207/93 (!) 179/82 (!) 191/95  Pulse: 95 95 95  Resp: 16    Temp: 98.2 F (36.8 C)    TempSrc: Temporal    SpO2: 93%    Weight: 273 lb (123.8 kg)    Height: 5\' 4"  (1.626 m)      Body mass index is 46.86 kg/m.  General:  Alert and oriented, no acute distress HEENT: Normal Neck: No bruit or JVD Pulmonary: Clear to auscultation bilaterally Cardiac: Regular Rate and Rhythm without murmur Gastrointestinal: Soft, non-tender, non-distended, no mass, no scars Skin: No rash Extremity Pulses:  2+ radial, brachial Musculoskeletal: No deformity or edema  Neurologic: Upper and lower extremity motor 5/5 and  symmetric  DATA:     Right Carotid Findings:  +----------+--------+--------+--------+------------------+----------------+        PSV cm/sEDV cm/sStenosisPlaque DescriptionComments        +----------+--------+--------+--------+------------------+----------------+   CCA Prox 86   0                             +----------+--------+--------+--------+------------------+----------------+   CCA Mid  58   0                             +----------+--------+--------+--------+------------------+----------------+  CCA Distal45   8                             +----------+--------+--------+--------+------------------+----------------+   ICA Prox             heterogenous   appears  occluded  +----------+--------+--------+--------+------------------+----------------+   ICA Mid                       appears  occluded  +----------+--------+--------+--------+------------------+----------------+   ICA Distal                     appears  occluded  +----------+--------+--------+--------+------------------+----------------+   ECA    283   15   >50%  heterogenous               +----------+--------+--------+--------+------------------+----------------+    +----------+--------+-------+----------------+-------------------+       PSV cm/sEDV cmsDescribe    Arm Pressure (mmHG)  +----------+--------+-------+----------------+-------------------+  [id number]       Multiphasic, WNL            +----------+--------+-------+----------------+-------------------+   +---------+--------+--+--------+-+---------+  VertebralPSV cm/s29EDV cm/s8Antegrade  +---------+--------+--+--------+-+---------+      Left Carotid Findings:   +----------+--------+--------+--------+------------------+--------------+       PSV cm/sEDV cm/sStenosisPlaque DescriptionComments     +----------+--------+--------+--------+------------------+--------------+  CCA Prox 111   29                         +----------+--------+--------+--------+------------------+--------------+  CCA Mid  71   25                         +----------+--------+--------+--------+------------------+--------------+  CCA Distal95   22                         +----------+--------+--------+--------+------------------+--------------+  ICA Prox 100   29   1-39%                    +----------+--------+--------+--------+------------------+--------------+  ICA Mid  131   32                         +----------+--------+--------+--------+------------------+--------------+  ICA Distal78   24                         +----------+--------+--------+--------+------------------+--------------+  ECA                         Not visualized  +----------+--------+--------+--------+------------------+--------------+   +----------+--------+--------+----------------+-------------------+       PSV cm/sEDV cm/sDescribe    Arm Pressure (mmHG)  +----------+--------+--------+----------------+-------------------+  [id number]       Multiphasic, WNL            +----------+--------+--------+----------------+-------------------+   +---------+--------+--+--------+--+---------+  VertebralPSV cm/s33EDV cm/s11Antegrade  +---------+--------+--+--------+--+---------+      Technically limited exam.   Summary:  Right Carotid: The ECA appears >50% stenosed. Right ICA appears occluded.   Left Carotid: Velocities in  the left ICA are consistent with a 1-39%  stenosis.   Vertebrals: Bilateral vertebral arteries demonstrate antegrade flow.  Subclavians: Normal flow hemodynamics were seen in bilateral subclavian        arteries.    ASSESSMENT:  S/P left CEA with 1-39% stenosis on duplex today The duplex is inconclusive, but indicates right ICA occlusion.  She is asymptomatic for stroke or TIA today.  PLAN: I have  scheduled her for CTA head and neck to further evaluate the right ICA.  Her previous CTA reported estimated at 70%. Plaque continues to the distal right ICA bulb.   Roxy Horseman PA-C [facility] [place] Office: [phone]  MD in clinic [place]

## 2020-05-23 ENCOUNTER — Ambulatory Visit (HOSPITAL_COMMUNITY): Payer: Medicare Other

## 2020-05-23 ENCOUNTER — Ambulatory Visit (HOSPITAL_COMMUNITY)
Admission: RE | Admit: 2020-05-23 | Discharge: 2020-05-23 | Disposition: A | Discharge status: Home / Self Care | Payer: Medicare Other | Source: Ambulatory Visit | Attending: Surgery | Admitting: Surgery

## 2020-05-23 ENCOUNTER — Other Ambulatory Visit: Payer: Self-pay

## 2020-05-23 DIAGNOSIS — I6523 Occlusion and stenosis of bilateral carotid arteries: Secondary | ICD-10-CM | POA: Diagnosis present

## 2020-05-23 LAB — POCT I-STAT CREATININE: Creatinine, Ser: 1.4 mg/dL — ABNORMAL HIGH (ref 0.44–1.00)

## 2020-05-23 MED ORDER — SODIUM CHLORIDE (PF) 0.9 % IJ SOLN
INTRAMUSCULAR | Status: AC
  Filled 2020-05-23: qty 50

## 2020-05-23 MED ORDER — IOHEXOL 350 MG/ML SOLN
100.0000 mL | Freq: Once | INTRAVENOUS | Status: AC | PRN
  Administered 2020-05-23: 100 mL via INTRAVENOUS

## 2020-05-27 ENCOUNTER — Ambulatory Visit (INDEPENDENT_AMBULATORY_CARE_PROVIDER_SITE_OTHER): Payer: Medicare Other | Admitting: Physician Assistant

## 2020-05-27 ENCOUNTER — Other Ambulatory Visit: Payer: Self-pay

## 2020-05-27 VITALS — BP 177/91 | HR 77 | Temp 98.1°F | Resp 20 | Ht 64.0 in | Wt 274.8 lb

## 2020-05-27 DIAGNOSIS — I6521 Occlusion and stenosis of right carotid artery: Secondary | ICD-10-CM

## 2020-05-27 DIAGNOSIS — I6523 Occlusion and stenosis of bilateral carotid arteries: Secondary | ICD-10-CM

## 2020-05-27 NOTE — Progress Notes (Signed)
Office Note     CC:  follow up Requesting Provider:  [facility]*  HPI: Madison Medina is a 59 y.o. (1961-05-13) female who presents with a history of bilateral carotid artery stenosis.  She is s/p left CEA in January of 2020 and presented with left MCA acute infarct. Post-op duplex showed patent endarterectomy and right ICA stenosis of 60-79%. Six month follow-up was recommended but not carried out due to Covid-19 pandemic.  Her recent follow-up in the clinic revealed occlusion of the right ICA by duplex ultrasound and CTA of head and neck arranged.  She presents for follow-up of those results.  She denies symptoms of stoke/TIA.  She has no residual deficits with the exception occasionally finding it difficult to find words. She continues to smoke and is compliant with statin and asa 81mg .  Past Medical History:  Diagnosis Date  . Asthma   . Back pain   . Carotid artery occlusion   . COPD (chronic obstructive pulmonary disease) ([place])   . Hyperlipidemia   . Hypertension   . Hypothyroidism   . Stroke [facility])     Past Surgical History:  Procedure Laterality Date  . BREAST BIOPSY  12/2018   at breast center in [place]  . CARDIAC CATHETERIZATION    . CAROTID ENDARTERECTOMY    . CARPAL TUNNEL RELEASE    . CYST EXCISION    . ENDARTERECTOMY Left 01/04/2019   Procedure: ENDARTERECTOMY CAROTID LEFT;  Surgeon: Serafina Mitchell, MD;  Location: [place];  Service: Vascular;  Laterality: Left;  . LEFT HEART CATH AND CORONARY ANGIOGRAPHY N/A 01/03/2019   Procedure: LEFT HEART CATH AND CORONARY ANGIOGRAPHY;  Surgeon: Belva Crome, MD;  Location: [place] [facility];  Service: Cardiovascular;  Laterality: N/A;  . PATCH ANGIOPLASTY Left 01/04/2019   Procedure: PATCH ANGIOPLASTY USING Rueben Bash BIOLOGIC PATCH;  Surgeon: Serafina Mitchell, MD;  [facility];  Service: Vascular;  Laterality: Left;    Social History   Socioeconomic History  . Marital status: Married     Spouse name: Not on file  . Number of children: Not on file  . Years of education: Not on file  . Highest education level: Not on file  Occupational History  . Not on file  Tobacco Use  . Smoking status: Current Every Day Smoker    Packs/day: 2.00    Years: 40.00    Pack years: 80.00    Types: Cigarettes  . Smokeless tobacco: Never Used  Vaping Use  . Vaping Use: Former  Substance and Sexual Activity  . Alcohol use: Not Currently  . Drug use: Never  . Sexual activity: Not on file  Other Topics Concern  . Not on file  Social History Narrative  . Not on file   Social Determinants of Health   Financial Resource Strain:   . Difficulty of Paying Living Expenses:   Food Insecurity:   . Worried About Charity fundraiser in the Last Year:   . Arboriculturist in the Last Year:   Transportation Needs:   . Film/video editor (Medical):   Marland Kitchen Lack of Transportation (Non-Medical):   Physical Activity:   . Days of Exercise per Week:   . Minutes of Exercise per Session:   Stress:   . Feeling of Stress :   Social Connections:   . Frequency of Communication with Friends and Family:   . Frequency of Social Gatherings with Friends and Family:   .  Attends Religious Services:   . Active Member of Clubs or Organizations:   . Attends Archivist Meetings:   Marland Kitchen Marital Status:   Intimate Partner Violence:   . Fear of Current or Ex-Partner:   . Emotionally Abused:   Marland Kitchen Physically Abused:   . Sexually Abused:    Family History  Problem Relation Age of Onset  . High blood pressure Other   . Diabetes Other   . Cancer Other   . Stroke Other   . Heart disease Other   . Heart disease Mother   . Diabetes Mother   . Heart disease Father   . Diabetes Father   . Heart disease Sister   . Heart disease Brother   . Diabetes Brother     Current Outpatient Medications  Medication Sig Dispense Refill  . acetaminophen (TYLENOL) 325 MG tablet Take 650 mg by mouth every 6 (six)  hours as needed for mild pain, fever or headache.    . albuterol (PROVENTIL) (2.5 MG/3ML) 0.083% nebulizer solution Use three times daily with nebulizer    . amLODipine (NORVASC) 5 MG tablet Take 5 mg by mouth daily.     Marland Kitchen aspirin EC 81 MG tablet Take 1 tablet (81 mg total) by mouth daily. 90 tablet 3  . atorvastatin (LIPITOR) 80 MG tablet Take 80 mg by mouth daily at 6 PM.     . cloNIDine (CATAPRES) 0.1 MG tablet Take 1 tablet daily if your systolic BP (top number) is greater than 180. 30 tablet 3  . clotrimazole-betamethasone (LOTRISONE) cream Apply topically 2 (two) times daily. to affected area    . famotidine (PEPCID) 40 MG tablet Take 40 mg by mouth at bedtime.    Marland Kitchen levothyroxine (SYNTHROID, LEVOTHROID) 200 MCG tablet Take 200 mcg by mouth daily before breakfast. (in addition to the 22mcg tablet)    . levothyroxine (SYNTHROID, LEVOTHROID) 25 MCG tablet Take 25 mcg by mouth daily before breakfast. (in addition to the 232mcg tablet)    . lisinopril (PRINIVIL,ZESTRIL) 20 MG tablet Take 20 mg by mouth 2 (two) times daily.     . nitroGLYCERIN (NITROSTAT) 0.4 MG SL tablet Place 1 tablet (0.4 mg total) under the tongue every 5 (five) minutes as needed for chest pain. 25 tablet 11  . omeprazole (PRILOSEC) 40 MG capsule Take 40 mg by mouth daily.    Marland Kitchen oxyCODONE-acetaminophen (PERCOCET) 7.5-325 MG tablet Take 1 tablet by mouth 3 (three) times daily.     . traZODone (DESYREL) 150 MG tablet Take 150 mg by mouth at bedtime.     No current facility-administered medications for this visit.    No Known Allergies   REVIEW OF SYSTEMS:   [X]  denotes positive finding, [ ]  denotes negative finding Cardiac  Comments:  Chest pain or chest pressure:    Shortness of breath upon exertion: x   Short of breath when lying flat:    Irregular heart rhythm:        Vascular    Pain in calf, thigh, or hip brought on by ambulation:    Pain in feet at night that wakes you up from your sleep:     Blood clot in  your veins:    Leg swelling:         Pulmonary    Oxygen at home:    Productive cough:  x  loose cough  Wheezing:         Neurologic    Sudden weakness in arms or  legs:     Sudden numbness in arms or legs:     Sudden onset of difficulty speaking or slurred speech:    Temporary loss of vision in one eye:     Problems with dizziness:         Gastrointestinal    Blood in stool:     Vomited blood:         Genitourinary    Burning when urinating:     Blood in urine:        Psychiatric    Major depression:         Hematologic    Bleeding problems:    Problems with blood clotting too easily:        Skin    Rashes or ulcers:        Constitutional    Fever or chills:      PHYSICAL EXAMINATION:  Vitals:   05/27/20 0914  BP: (!) 177/91  Pulse: 77  Resp: 20  Temp: 98.1 F (36.7 C)  SpO2: 95%   General:  WDWN in NAD; vital signs documented above Gait: steady, no ataxia HENT: WNL, normocephalic Pulmonary: normal non-labored breathing , without Rales, rhonchi,  wheezing Cardiac: regular HR, without  Murmurs without carotid bruit Abdomen: soft, NT, no masses Skin: without rashes Vascular Exam/Pulses: 2+ left brachial. 2+ ulnar, PT and DP pulses bilaterally. 1+ right brachial and radial pulses Extremities: without ischemic changes, without Gangrene , without cellulitis; without open wounds;  Musculoskeletal: no muscle wasting or atrophy  Neurologic: A&O X 3;  No focal weakness or paresthesias are detected Psychiatric:  The pt has Normal affect.   Non-Invasive Vascular Imaging:   IMPRESSION: 1. Occluded Right ICA origin through proximal right ICA siphon, new since the 2020 CTA. Reconstituted Right ICA terminus, with stable right anterior circulation from the prior CTA.  2. Interval Left Carotid Endarterectomy with no adverse features.  3. Otherwise stable advanced atherosclerosis in the head and neck, most notable for Moderate (origin) to Severe (V4 segment)  stenosis of the Dominant Left Vertebral Artery which arises directly from the arch and supplies the Basilar.  4. No acute intracranial abnormality by CT, but evidence of progressed small and medium-sized vessel ischemia in the brain since 2020.  5.  Aortic Atherosclerosis (ICD10-I70.0).  Electronically Signed: By: Genevie Ann M.D. On: 05/23/2020 17:39  ASSESSMENT/PLAN:: 59 y.o. female here for follow up for carotid artery stenosis.  Patent left CEA. Occluded right ICA. She is asymptomatic.  Discussed smoking cessation, signs and symptoms of stroke/TIA and continued routine surveillance.  Follow-up in one year or sooner for symptoms.  Barbie Banner, PA-C [facility] [phone]  Clinic MD:   Trula Slade

## 2020-09-06 ENCOUNTER — Ambulatory Visit: Payer: Medicare Other | Admitting: Cardiology

## 2020-10-17 DIAGNOSIS — I6529 Occlusion and stenosis of unspecified carotid artery: Secondary | ICD-10-CM | POA: Insufficient documentation

## 2020-10-17 DIAGNOSIS — M549 Dorsalgia, unspecified: Secondary | ICD-10-CM | POA: Insufficient documentation

## 2020-10-17 DIAGNOSIS — E039 Hypothyroidism, unspecified: Secondary | ICD-10-CM | POA: Insufficient documentation

## 2020-10-17 DIAGNOSIS — E785 Hyperlipidemia, unspecified: Secondary | ICD-10-CM | POA: Insufficient documentation

## 2020-10-17 DIAGNOSIS — I639 Cerebral infarction, unspecified: Secondary | ICD-10-CM | POA: Insufficient documentation

## 2020-10-17 DIAGNOSIS — I10 Essential (primary) hypertension: Secondary | ICD-10-CM | POA: Insufficient documentation

## 2020-10-17 DIAGNOSIS — J45909 Unspecified asthma, uncomplicated: Secondary | ICD-10-CM | POA: Insufficient documentation

## 2020-10-20 NOTE — Progress Notes (Deleted)
Cardiology Office Note:    Date:  10/21/2020   ID:  Madison Medina, DOB [birth year], MRN [id number]  PCP:  [facility]  Cardiologist:  Shirlee More, MD    Referring MD: [facility]    ASSESSMENT:    1. Orthostatic hypotension   2. Essential hypertension   3. Familial combined hyperlipidemia   4. Mild CAD    PLAN:    In order of problems listed above:  1. ***   Next appointment: ***   Medication Adjustments/Labs and Tests Ordered: Current medicines are reviewed at length with the patient today.  Concerns regarding medicines are outlined above.  No orders of the defined types were placed in this encounter.  No orders of the defined types were placed in this encounter.   No chief complaint on file.   History of Present Illness:    Madison Medina is a 59 y.o. female with a hx of symptomatic hypotension with syncope orthostatic hypotension mild nonobstructive CAD at coronary angiography January 2020 hypertension hyperlipidemia hypothyroidism COPD history of stroke left cerebellar hemisphere and previous left carotid endarterectomy last seen 08/03/2019 she was admitted to [facility] May 2020 for syncope due to orthostatic hypotension.  MRI of the brain showed no acute findings carotid duplex showed 70% right ICA stenosis 50% left ICA stenosis echocardiogram showed normal left ventricular systolic function and hypotension resolved with IV fluid loading discontinuation of a thiazide diuretic and was continued on amlodipine and lisinopril for hypertension.  This occurred in the context of radiation therapy for breast cancer.  Compliance with diet, lifestyle and medications: ***  Past Medical History:  Diagnosis Date  . AKI (acute kidney injury) ([place]) 12/30/2018  . Anxiety 10/12/2016  . Asthma   . Back pain   . CAD (coronary artery disease) 01/03/2019  . Carotid artery occlusion   . COPD (chronic  obstructive pulmonary disease) ([place])   . CVA (cerebral vascular accident) ([place]) 12/30/2018  . 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 HTN (hypertension) 12/30/2018  . Hyperlipidemia   . Hypertension   . Hyponatremia 12/30/2018  . Hypothyroid 02/03/2016  . Hypothyroidism   . Nonobstructive atherosclerosis of coronary artery 08/03/2019  . Stroke ([place])   . Tobacco use 07/09/2016    Past Surgical History:  Procedure Laterality Date  . BREAST BIOPSY  12/2018   at breast center in [place]  . CARDIAC CATHETERIZATION    . CAROTID ENDARTERECTOMY    . CARPAL TUNNEL RELEASE    . CYST EXCISION    . ENDARTERECTOMY Left 01/04/2019   Procedure: ENDARTERECTOMY CAROTID LEFT;  Surgeon: Serafina Mitchell, MD;  Location: [facility];  Service: Vascular;  Laterality: Left;  . LEFT HEART CATH AND CORONARY ANGIOGRAPHY N/A 01/03/2019   Procedure: LEFT HEART CATH AND CORONARY ANGIOGRAPHY;  Surgeon: Belva Crome, MD;  Location: [facility];  Service: Cardiovascular;  Laterality: N/A;  . PATCH ANGIOPLASTY Left 01/04/2019   Procedure: PATCH ANGIOPLASTY USING Rueben Bash BIOLOGIC PATCH;  Surgeon: Serafina Mitchell, MD;  Location: [facility] OR;  Service: Vascular;  Laterality: Left;    Current Medications: No outpatient medications have been marked as taking for the 10/21/20 encounter (Appointment) with Richardo Priest, MD.     Allergies:   Patient has no known allergies.   Social History   Socioeconomic History  . Marital status: Married    Spouse name: Not on file  . Number of children: Not on file  .  Years of education: Not on file  . Highest education level: Not on file  Occupational History  . Not on file  Tobacco Use  . Smoking status: Current Every Day Smoker    Packs/day: 2.00    Years: 40.00    Pack years: 80.00    Types: Cigarettes  . Smokeless tobacco: Never Used  Vaping Use  . Vaping Use: Former  Substance and Sexual Activity  . Alcohol use: Not Currently  . Drug use: Never  . Sexual  activity: Not on file  Other Topics Concern  . Not on file  Social History Narrative  . Not on file   Social Determinants of Health   Financial Resource Strain:   . Difficulty of Paying Living Expenses: Not on file  Food Insecurity:   . Worried About Charity fundraiser in the Last Year: Not on file  . Ran Out of Food in the Last Year: Not on file  Transportation Needs:   . Lack of Transportation (Medical): Not on file  . Lack of Transportation (Non-Medical): Not on file  Physical Activity:   . Days of Exercise per Week: Not on file  . Minutes of Exercise per Session: Not on file  Stress:   . Feeling of Stress : Not on file  Social Connections:   . Frequency of Communication with Friends and Family: Not on file  . Frequency of Social Gatherings with Friends and Family: Not on file  . Attends Religious Services: Not on file  . Active Member of Clubs or Organizations: Not on file  . Attends Archivist Meetings: Not on file  . Marital Status: Not on file     Family History: The patient's ***family history includes Cancer in an other family member; Diabetes in her brother, father, mother, and another family member; Heart disease in her brother, father, mother, sister, and another family member; High blood pressure in an other family member; Stroke in an other family member. ROS:   Please see the history of present illness.    All other systems reviewed and are negative.  EKGs/Labs/Other Studies Reviewed:    The following studies were reviewed today:  EKG:  EKG ordered today and personally reviewed.  The ekg ordered today demonstrates ***  Recent Labs: 05/23/2020: Creatinine, Ser 1.40  Recent Lipid Panel    Component Value Date/Time   CHOL 245 (H) 12/31/2018 0529   TRIG 117 12/31/2018 0529   HDL 39 (L) 12/31/2018 0529   CHOLHDL 6.3 12/31/2018 0529   VLDL 23 12/31/2018 0529   LDLCALC 183 (H) 12/31/2018 0529    Physical Exam:    VS:  There were no vitals  taken for this visit.    Wt Readings from Last 3 Encounters:  05/27/20 274 lb 12.8 oz (124.6 kg)  05/20/20 273 lb (123.8 kg)  10/18/19 274 lb 9.6 oz (124.6 kg)     GEN: *** Well nourished, well developed in no acute distress HEENT: Normal NECK: No JVD; No carotid bruits LYMPHATICS: No lymphadenopathy CARDIAC: ***RRR, no murmurs, rubs, gallops RESPIRATORY:  Clear to auscultation without rales, wheezing or rhonchi  ABDOMEN: Soft, non-tender, non-distended MUSCULOSKELETAL:  No edema; No deformity  SKIN: Warm and dry NEUROLOGIC:  Alert and oriented x 3 PSYCHIATRIC:  Normal affect    Signed, Shirlee More, MD  10/21/2020 1:32 PM    [facility]

## 2020-10-21 ENCOUNTER — Ambulatory Visit: Payer: Medicare Other | Admitting: Cardiology

## 2020-11-13 ENCOUNTER — Other Ambulatory Visit: Payer: Self-pay | Admitting: Hematology and Oncology

## 2020-11-13 DIAGNOSIS — C50411 Malignant neoplasm of upper-outer quadrant of right female breast: Secondary | ICD-10-CM

## 2020-11-21 NOTE — Progress Notes (Signed)
[facility]  [address] [place],  [place]  [address] [phone]  Clinic Day:  11/22/2020  Referring physician: [facility]   This document serves as a record of services personally performed by Hosie Poisson, MD. It was created on their behalf by Curry,Lauren E, a trained medical scribe. The creation of this record is based on the scribe's personal observations and the provider's statements to them.   CHIEF COMPLAINT:  CC: Stage IA bilateral hormone receptor positive breast cancer  Current Treatment:  Anastrozole 1 mg daily for a total of 5 years   HISTORY OF PRESENT ILLNESS:  Madison Medina is a 59 y.o. 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  We began seeing her in February.  Her disease was discovered on mammography.  There was an 8 mm mass within the right breast,which appeared suspicious, and a 6 mm indeterminate mass within the left breast, as well as an area of distortion within the left breast.   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  The right breast lesion had positive estrogen and progesterone receptors, negative HER 2 and a Ki 67 of 10%.  The left breast had positive estrogen and progesterone receptors, negative HER 2 and Ki 67 of 5%.  She developed a stroke after her biopsy and was transferred to [facility], where she had a carotid endarterectomy.  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  Shes is being followed by a vascular surgeon.  She finally had bilateral lumpectomies performed in March.  Pathology of the right breast revealed a 12 mm, grade 2, invasive ductal carcinoma with negative sentinel node.  Pathology of the left breast revealed a 12 mm, grade 2, invasive ductal carcinoma with negative sentinel node.  Margins were clear.  Adjuvant chemotherapy was not recommended,  however she did receive adjuvant radiation to the bilateral breasts, completed in August.  Due to her personal and family history of malignancy, she underwent testing for hereditary cancer syndromes with the My[employer] myRisk hereditary cancer panel test in June.  This did not reveal any clinically significant mutation or variants of uncertain significance.  Due to her history of stroke, we felt tamoxifen would be contraindicated due to the increased risk of thrombosis seen with it.  She had a bone density scan in late September 2020, which revealed normal bone density.  She was placed on anastrozole 1 mg daily in December 2020.  She had a palpate a mass in the upper outer quadrant of her right breast in late 2020, and spot tangential view of this region shows a circumscribed lucent mass measuring 1.8 cm which was consistent with benign fat necrosis.  No evidence of malignancy was identified.  She continues to smoke 1 and half packs of cigarettes daily.  She has smoked since age 34 and at least 1 pack per day for 30 years.  We have given her information on the [place].  She has COPD and sleep apnea, and uses a C-PAP mask.   INTERVAL HISTORY:  Madison Medina is here for routine follow up and states that she has been well.  She continues anastrozole daily without significant difficulty.  Her shortness of breath remains stable.  Low dose CT for lung cancer screening from September 2021 revealed Lung-RADS 2, benign appearance or behavior.  Her  appetite is good, and  she has lost 9 pounds since her last visit.  She denies fever, chills or other signs of infection.  She denies nausea, vomiting, bowel issues, or abdominal pain.  She denies sore throat, cough, dyspnea, or chest pain.   REVIEW OF SYSTEMS:  Review of Systems  Constitutional: Negative.   HENT:  Negative.   Eyes: Negative.   Respiratory: Positive for shortness of breath (stable, COPD) and wheezing (mild, stable).   Cardiovascular: Negative.    Gastrointestinal: Negative.   Endocrine: Negative.   Genitourinary: Negative.    Musculoskeletal: Negative.   Skin: Negative.   Neurological: Negative.   Hematological: Negative.   Psychiatric/Behavioral: Negative.      VITALS:  Blood pressure (!) 181/84, pulse 87, temperature 97.9 F (36.6 C), temperature source Oral, resp. rate 18, height _0  (1.626 m), weight 268 lb 9.6 oz (121.8 kg), SpO2 94 %.  Wt Readings from Last 3 Encounters:  11/22/20 268 lb 9.6 oz (121.8 kg)  05/27/20 274 lb 12.8 oz (124.6 kg)  05/20/20 273 lb (123.8 kg)    Body mass index is 46.11 kg/m.  Performance status (ECOG): 1 - Symptomatic but completely ambulatory  PHYSICAL EXAM:  Physical Exam Constitutional:      General: She is not in acute distress.    Appearance: Normal appearance. She is normal weight.  HENT:     Head: Normocephalic and atraumatic.  Eyes:     General: No scleral icterus.    Extraocular Movements: Extraocular movements intact.     Conjunctiva/sclera: Conjunctivae normal.     Pupils: Pupils are equal, round, and reactive to light.  Cardiovascular:     Rate and Rhythm: Normal rate and regular rhythm.     Pulses: Normal pulses.     Heart sounds: Normal heart sounds. No murmur heard. No friction rub. No gallop.   Pulmonary:     Effort: Pulmonary effort is normal. No respiratory distress.     Breath sounds: Wheezing (mild, bilateral) present.  Chest:  Breasts:     Right: Normal.     Left: Normal.      Comments: Inversion of the left nipple.  Scar tissue in the lower outer quadrant of the right breast.  No masses in either breast. Abdominal:     General: Bowel sounds are normal. There is no distension.     Palpations: Abdomen is soft. There is no mass.     Tenderness: There is no abdominal tenderness.  Musculoskeletal:        General: Normal range of motion.     Cervical back: Normal range of motion and neck supple.     Right lower leg: No edema.     Left lower leg: No  edema.  Lymphadenopathy:     Cervical: No cervical adenopathy.  Skin:    General: Skin is warm and dry.  Neurological:     General: No focal deficit present.     Mental Status: She is alert and oriented to person, place, and time. Mental status is at baseline.  Psychiatric:        Mood and Affect: Mood normal.        Behavior: Behavior normal.        Thought Content: Thought content normal.        Judgment: Judgment normal.     LABS:   CBC Latest Ref Rng & Units 01/05/2019 01/04/2019 01/03/2019  WBC 4.0 - 10.5 K/uL 14.8(H) 13.8(H) 9.0  Hemoglobin 12.0 - 15.0 g/dL 11.7(L) 11.7(L) 11.0(L)  Hematocrit 36.0 - 46.0 % 36.1 36.2 35.3(L)  Platelets 150 - 400 K/uL 220 240 204   CMP Latest Ref Rng & Units 05/23/2020 01/05/2019 01/04/2019  Glucose 70 - 99 mg/dL - 96 156(H)  BUN 6 - 20 mg/dL - 13 13  Creatinine 0.44 - 1.00 mg/dL 1.40(H) 0.75 0.97  Sodium 135 - 145 mmol/L - 132(L) 136  Potassium 3.5 - 5.1 mmol/L - 4.5 4.1  Chloride 98 - 111 mmol/L - 99 104  CO2 22 - 32 mmol/L - 24 24  Calcium 8.9 - 10.3 mg/dL - 8.2(L) 8.1(L)  Total Protein 6.5 - 8.1 g/dL - - 6.1(L)  Total Bilirubin 0.3 - 1.2 mg/dL - - 0.8  Alkaline Phos 38 - 126 U/L - - 57  AST 15 - 41 U/L - - 22  ALT 0 - 44 U/L - - 22     STUDIES:   She underwent a CT chest without contrast low dose for lung cancer screening on 08/30/2020 showing: 1. Lung-RADS 2, benign appearance or behavior. Continue annual screening with low-dose chest CT without contrast in 12 months. 2. Coronary artery calcifications.   Allergies: No Known Allergies  Current Medications: Current Outpatient Medications  Medication Sig Dispense Refill  . albuterol (PROVENTIL) (2.5 MG/3ML) 0.083% nebulizer solution Use three times daily with nebulizer    . amLODipine (NORVASC) 5 MG tablet Take 5 mg by mouth daily.     Marland Kitchen anastrozole (ARIMIDEX) 1 MG tablet TAKE ONE TABLET BY MOUTH EVERY DAY 90 tablet 3  . aspirin EC 81 MG tablet Take 1 tablet (81 mg total) by  mouth daily. 90 tablet 3  . atorvastatin (LIPITOR) 80 MG tablet Take 80 mg by mouth daily at 6 PM.     . busPIRone (BUSPAR) 5 MG tablet SMARTSIG:1 Tablet(s) By Mouth Twice Daily    . celecoxib (CELEBREX) 200 MG capsule Take 200 mg by mouth 2 (two) times daily.    . cloNIDine (CATAPRES) 0.1 MG tablet Take 1 tablet daily if your systolic BP (top number) is greater than 180. 30 tablet 3  . famotidine (PEPCID) 40 MG tablet Take 40 mg by mouth at bedtime.    Marland Kitchen levothyroxine (SYNTHROID) 300 MCG tablet Take by mouth.    Marland Kitchen lisinopril (PRINIVIL,ZESTRIL) 20 MG tablet Take 20 mg by mouth 2 (two) times daily.     Marland Kitchen omeprazole (PRILOSEC) 40 MG capsule Take 40 mg by mouth daily.    Marland Kitchen oxyCODONE-acetaminophen (PERCOCET) 7.5-325 MG tablet Take 1 tablet by mouth 3 (three) times daily.     . promethazine-dextromethorphan (PROMETHAZINE-DM) 6.25-15 MG/5ML syrup Take 5 mLs by mouth 2 (two) times daily as needed.    . traZODone (DESYREL) 150 MG tablet Take 150 mg by mouth at bedtime.    . triamcinolone cream (KENALOG) 0.5 % Apply topically as needed.    . clobetasol cream (TEMOVATE) 1.61 % Apply 1 application topically 2 (two) times daily between meals as needed.    . nitroGLYCERIN (NITROSTAT) 0.4 MG SL tablet Place 1 tablet (0.4 mg total) under the tongue every 5 (five) minutes as needed for chest pain. 25 tablet 11   No current facility-administered medications for this visit.     ASSESSMENT & PLAN:   Assessment:   1. Bilateral stage IA hormone receptor positive breast cancers, treated with bilateral lumpectomies followed by adjuvant radiation therapy.  She remains without evidence of recurrence.  She knows to continue anastrozole1 mg daily for least a total of 5 years.  2. Candidiasis of the bilateral inframammary areas.  3. Tobacco abuse.  Due to her 30+ pack-year history of smoking, I recommend low-dose CT chest screening for lung cancer.  We have discussed smoking cessation.  Plan: She is scheduled  for annual mammogram on December 15th with Dr. Lilia Pro.  As she has not undergone routine blood work recently, I will order CBC and CMP today, and I will call her with the results.  We will plan to see her back in 4 months for re-examination.  The patient understands the plans discussed today and is in agreement with them.  She knows to contact our office if she has difficulty tolerating anastrozole or develops other prior to her next appointment.   I provided 15 minutes of face-to-face time during this this encounter and > 50% was spent counseling as documented under my assessment and plan.    Derwood Kaplan, MD [facility] [address] [place] [address] Dept: [phone] Dept Fax: [phone]   I, Rita Ohara, am acting as scribe for Derwood Kaplan, MD  I have reviewed this report as typed by the medical scribe, and it is complete and accurate.

## 2020-11-22 ENCOUNTER — Telehealth: Payer: Self-pay | Admitting: Oncology

## 2020-11-22 ENCOUNTER — Encounter: Payer: Self-pay | Admitting: Oncology

## 2020-11-22 ENCOUNTER — Other Ambulatory Visit: Payer: Self-pay

## 2020-11-22 ENCOUNTER — Inpatient Hospital Stay: Payer: Medicare Other

## 2020-11-22 ENCOUNTER — Inpatient Hospital Stay: Payer: Medicare Other | Attending: Oncology | Admitting: Oncology

## 2020-11-22 ENCOUNTER — Other Ambulatory Visit: Payer: Self-pay | Admitting: Hematology and Oncology

## 2020-11-22 ENCOUNTER — Other Ambulatory Visit: Payer: Self-pay | Admitting: Oncology

## 2020-11-22 VITALS — BP 181/84 | HR 87 | Temp 97.9°F | Resp 18 | Ht 64.0 in | Wt 268.6 lb

## 2020-11-22 DIAGNOSIS — C50412 Malignant neoplasm of upper-outer quadrant of left female breast: Secondary | ICD-10-CM | POA: Diagnosis not present

## 2020-11-22 DIAGNOSIS — C50411 Malignant neoplasm of upper-outer quadrant of right female breast: Secondary | ICD-10-CM | POA: Diagnosis not present

## 2020-11-22 DIAGNOSIS — Z170 Estrogen receptor positive status [ER+]: Secondary | ICD-10-CM

## 2020-11-22 LAB — BASIC METABOLIC PANEL
BUN: 28 — AB (ref 4–21)
CO2: 24 — AB (ref 13–22)
Chloride: 105 (ref 99–108)
Creatinine: 0.8 (ref 0.5–1.1)
Glucose: 108
Potassium: 4.5 (ref 3.4–5.3)
Sodium: 136 — AB (ref 137–147)

## 2020-11-22 LAB — HEPATIC FUNCTION PANEL
ALT: 45 — AB (ref 7–35)
AST: 53 — AB (ref 13–35)
Alkaline Phosphatase: 86 (ref 25–125)
Bilirubin, Total: 0.4

## 2020-11-22 LAB — COMPREHENSIVE METABOLIC PANEL
Albumin: 4.1 (ref 3.5–5.0)
Calcium: 9.3 (ref 8.7–10.7)

## 2020-11-22 LAB — CBC AND DIFFERENTIAL
HCT: 39 (ref 36–46)
Hemoglobin: 13 (ref 12.0–16.0)
Neutrophils Absolute: 6.22
Platelets: 340 (ref 150–399)
WBC: 8.4

## 2020-11-22 LAB — CBC: RBC: 4.56 (ref 3.87–5.11)

## 2020-11-22 NOTE — Telephone Encounter (Signed)
Per 12/10 LOS, patient scheduled for April 2022 Appt's.  Gave patient Appt Summary

## 2020-11-26 ENCOUNTER — Telehealth: Payer: Self-pay

## 2020-11-26 NOTE — Telephone Encounter (Signed)
-----   Message from Derwood Kaplan, MD sent at 11/22/2020  6:26 PM EST ----- Regarding: call pt Tell her labs look good except mildly abn liver tests, probably from her cholesterol medicine.  Will just need to check occasionally.  Pls send copy to her PCP & we can repeat next time.

## 2020-11-26 NOTE — Telephone Encounter (Signed)
Called patient and informed her about her lab results. Also informed her that the results will be faxed to PCP.

## 2020-12-25 ENCOUNTER — Telehealth: Payer: Self-pay | Admitting: Cardiology

## 2020-12-25 NOTE — Telephone Encounter (Signed)
I say yes if OK [place] Does not look like seen by [place]

## 2020-12-25 NOTE — Telephone Encounter (Signed)
Patient has to be seen first. She has appointment Friday so can order then.

## 2020-12-25 NOTE — Telephone Encounter (Signed)
Alex from [facility] is requesting for this patient to have an echo. Can we place that order?

## 2020-12-26 NOTE — Progress Notes (Signed)
Cardiology Office Note:    Date:  12/27/2020   ID:  Madison Medina, DOB 1961/08/14, MRN [id number]  PCP:  [facility]  Cardiologist:  Shirlee More, MD    Referring MD: [facility]*    ASSESSMENT:    1. Mild CAD   2. Familial combined hyperlipidemia   3. Essential hypertension   4. Bilateral carotid artery stenosis    PLAN:    In order of problems listed above:  1. She continues to have stable angina add oral nitrates to her medical regimen. Antihypertensives and high intensity statin 2. Recheck lipids today may well require combined therapy and I would favor PCSK9 inhibitor over Zetia 3. BP at target continue treatment including calcium channel blocker centrally active clonidine and lisinopril check renal function potassium 4. Check carotid duplex at her request   Next appointment: 6 months   Medication Adjustments/Labs and Tests Ordered: Current medicines are reviewed at length with the patient today.  Concerns regarding medicines are outlined above.  No orders of the defined types were placed in this encounter.  No orders of the defined types were placed in this encounter.   Chief Complaint  Patient presents with  . Follow-up  . Coronary Artery Disease  . Hyperlipidemia    History of Present Illness:    Madison Medina is a 61 y.o. female with a hx of previous syncope hypertension familial combined hyperlipidemia and mild nonobstructive CAD.  Other problems include breast cancer with radiation therapy and surgery.  She wants last seen me 08/03/2019. Cath 12/2018 with LAD mid vessel 40-50% narrowing, RCA 40%, PDA 60%, LVEF >60%. She follows Dr. 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 She had a left carotid endarterectomy performed after her stroke 12/30/2018. Get the ultrasound 05/20/2020 she was occlusion of the right internal carotid artery and no stenosis by area on the  left after carotid endarterectomy. She is due for follow-up and requested to have it performed in my office.  Compliance with diet, lifestyle and medications: Yes  She has typical angina chest discomfort is central and radiates to the right chest with activity relieved with rest occurs almost daily stable pattern last for about 10 min. Can intensify medical therapy adding more monitor nitrates and continue other treatment including calcium channel blocker aspirin and her statin. No shortness of breath palpitation or syncope  Tests reviewed for today's visit: Left heart cath 01/03/2019: Coronary Diagrams Coronary Findings   Diagnostic Dominance: Right  Left Anterior Descending  Prox LAD lesion is 40% stenosed.  First Diagonal Branch  Vessel is small in size.  First Septal Branch  Vessel is small in size.  Ramus Intermedius  Ramus lesion is 25% stenosed.  Right Coronary Artery  Prox RCA to Mid RCA lesion is 40% stenosed.  Right Posterior Descending Artery  RPDA lesion is 60% stenosed.     Diagnostic Dominance: Right      She had CTA angio of the chest performed 12/17/2020 which showed no evidence of pulmonary embolism there is coronary artery calcification consistent with her known CAD and stable small nodule left lung. Past Medical History:  Diagnosis Date  . AKI (acute kidney injury) ([place]) 12/30/2018  . Anxiety 10/12/2016  . Asthma   . Back pain   . CAD (coronary artery disease) 01/03/2019  . Carotid artery occlusion   . COPD (chronic obstructive pulmonary disease) ([place])   . CVA (cerebral vascular accident) ([place]) 12/30/2018  .  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 HTN (hypertension) 12/30/2018  . Hyperlipidemia   . Hypertension   . Hyponatremia 12/30/2018  . Hypothyroid 02/03/2016  . Hypothyroidism   . Nonobstructive atherosclerosis of coronary artery 08/03/2019  . Stroke ([place])   . Tobacco use 07/09/2016    Past Surgical History:  Procedure Laterality Date   . BREAST BIOPSY  12/2018   at breast center in [place]  . CARDIAC CATHETERIZATION    . CAROTID ENDARTERECTOMY    . CARPAL TUNNEL RELEASE    . CYST EXCISION    . ENDARTERECTOMY Left 01/04/2019   Procedure: ENDARTERECTOMY CAROTID LEFT;  Surgeon: Serafina Mitchell, MD;  Location: [facility];  Service: Vascular;  Laterality: Left;  . LEFT HEART CATH AND CORONARY ANGIOGRAPHY N/A 01/03/2019   Procedure: LEFT HEART CATH AND CORONARY ANGIOGRAPHY;  Surgeon: Belva Crome, MD;  Location: [facility];  Service: Cardiovascular;  Laterality: N/A;  . PATCH ANGIOPLASTY Left 01/04/2019   Procedure: PATCH ANGIOPLASTY USING Rueben Bash BIOLOGIC PATCH;  Surgeon: Serafina Mitchell, MD;  Location: [facility] OR;  Service: Vascular;  Laterality: Left;    Current Medications: Current Meds  Medication Sig  . albuterol (PROVENTIL) (2.5 MG/3ML) 0.083% nebulizer solution Use three times daily with nebulizer  . amLODipine (NORVASC) 5 MG tablet Take 5 mg by mouth daily.   Marland Kitchen anastrozole (ARIMIDEX) 1 MG tablet TAKE ONE TABLET BY MOUTH EVERY DAY  . aspirin EC 81 MG tablet Take 1 tablet (81 mg total) by mouth daily.  Marland Kitchen atorvastatin (LIPITOR) 40 MG tablet Take 40 mg by mouth daily.  . busPIRone (BUSPAR) 5 MG tablet SMARTSIG:1 Tablet(s) By Mouth Twice Daily  . celecoxib (CELEBREX) 200 MG capsule Take 200 mg by mouth 2 (two) times daily.  . clobetasol cream (TEMOVATE) 5.18 % Apply 1 application topically 2 (two) times daily between meals as needed.  . cloNIDine (CATAPRES) 0.1 MG tablet Take 1 tablet daily if your systolic BP (top number) is greater than 180.  . famotidine (PEPCID) 40 MG tablet Take 40 mg by mouth at bedtime.  Marland Kitchen levothyroxine (SYNTHROID) 300 MCG tablet Take by mouth.  Marland Kitchen lisinopril (PRINIVIL,ZESTRIL) 20 MG tablet Take 20 mg by mouth 2 (two) times daily.   . nitroGLYCERIN (NITROSTAT) 0.4 MG SL tablet Place 1 tablet (0.4 mg total) under the tongue every 5 (five) minutes as needed for chest pain.  Marland Kitchen omeprazole  (PRILOSEC) 40 MG capsule Take 40 mg by mouth daily.  Marland Kitchen oxyCODONE-acetaminophen (PERCOCET) 7.5-325 MG tablet Take 1 tablet by mouth 3 (three) times daily.   . promethazine-dextromethorphan (PROMETHAZINE-DM) 6.25-15 MG/5ML syrup Take 5 mLs by mouth 2 (two) times daily as needed.  . traZODone (DESYREL) 150 MG tablet Take 150 mg by mouth at bedtime.  . triamcinolone cream (KENALOG) 0.5 % Apply topically as needed.  . [DISCONTINUED] atorvastatin (LIPITOR) 80 MG tablet Take 80 mg by mouth daily at 6 PM.      Allergies:   Patient has no known allergies.   Social History   Socioeconomic History  . Marital status: Married    Spouse name: Not on file  . Number of children: Not on file  . Years of education: Not on file  . Highest education level: Not on file  Occupational History  . Not on file  Tobacco Use  . Smoking status: Current Every Day Smoker    Packs/day: 2.00    Years: 40.00    Pack years: 80.00    Types: Cigarettes  .  Smokeless tobacco: Never Used  Vaping Use  . Vaping Use: Former  Substance and Sexual Activity  . Alcohol use: Not Currently  . Drug use: Never  . Sexual activity: Not on file  Other Topics Concern  . Not on file  Social History Narrative  . Not on file   Social Determinants of Health   Financial Resource Strain: Not on file  Food Insecurity: Not on file  Transportation Needs: Not on file  Physical Activity: Not on file  Stress: Not on file  Social Connections: Not on file     Family History: The patient's family history includes Cancer in an other family member; Diabetes in her brother, father, mother, and another family member; Heart disease in her brother, father, mother, sister, and another family member; High blood pressure in an other family member; Stroke in an other family member. ROS:   Please see the history of present illness.    All other systems reviewed and are negative.  EKGs/Labs/Other Studies Reviewed:    The following studies  were reviewed today:  EKG:  EKG ordered today and personally reviewed.  The ekg ordered today demonstrates sinus rhythm minor nonspecific ST abnormality otherwise normal EKG  Recent Labs: 11/22/2020: ALT 45; BUN 28; Creatinine 0.8; Hemoglobin 13.0; Platelets 340; Potassium 4.5; Sodium 136  Recent Lipid Panel    Component Value Date/Time   CHOL 245 (H) 12/31/2018 0529   TRIG 117 12/31/2018 0529   HDL 39 (L) 12/31/2018 0529   CHOLHDL 6.3 12/31/2018 0529   VLDL 23 12/31/2018 0529   LDLCALC 183 (H) 12/31/2018 0529    Physical Exam:    VS:  BP 134/60   Pulse 87   Ht 5\' 4"  (1.626 m)   Wt 268 lb (121.6 kg)   SpO2 95%   BMI 46.00 kg/m     Wt Readings from Last 3 Encounters:  12/27/20 268 lb (121.6 kg)  11/22/20 268 lb 9.6 oz (121.8 kg)  05/27/20 274 lb 12.8 oz (124.6 kg)     GEN:  Well nourished, well developed in no acute distress HEENT: Normal NECK: No JVD; No carotid bruits LYMPHATICS: No lymphadenopathy CARDIAC: RRR, no murmurs, rubs, gallops RESPIRATORY:  Clear to auscultation without rales, wheezing or rhonchi  ABDOMEN: Soft, non-tender, non-distended MUSCULOSKELETAL:  No edema; No deformity  SKIN: Warm and dry NEUROLOGIC:  Alert and oriented x 3 PSYCHIATRIC:  Normal affect    Signed, Shirlee More, MD  12/27/2020 1:01 PM    [facility]

## 2020-12-27 ENCOUNTER — Encounter: Payer: Self-pay | Admitting: Cardiology

## 2020-12-27 ENCOUNTER — Ambulatory Visit: Payer: Medicare Other | Admitting: Cardiology

## 2020-12-27 ENCOUNTER — Other Ambulatory Visit: Payer: Self-pay

## 2020-12-27 VITALS — BP 134/60 | HR 87 | Ht 64.0 in | Wt 268.0 lb

## 2020-12-27 DIAGNOSIS — E7849 Other hyperlipidemia: Secondary | ICD-10-CM

## 2020-12-27 DIAGNOSIS — I6523 Occlusion and stenosis of bilateral carotid arteries: Secondary | ICD-10-CM

## 2020-12-27 DIAGNOSIS — I10 Essential (primary) hypertension: Secondary | ICD-10-CM | POA: Diagnosis not present

## 2020-12-27 DIAGNOSIS — I2510 Atherosclerotic heart disease of native coronary artery without angina pectoris: Secondary | ICD-10-CM | POA: Diagnosis not present

## 2020-12-27 MED ORDER — ISOSORBIDE MONONITRATE ER 30 MG PO TB24: 30.0000 mg | ORAL_TABLET | Freq: Every day | ORAL | 3 refills | Status: DC

## 2020-12-27 NOTE — Addendum Note (Signed)
Addended by: Resa Miner I on: 12/27/2020 01:13 PM   Modules accepted: Orders

## 2020-12-27 NOTE — Patient Instructions (Signed)
Medication Instructions:  Your physician has recommended you make the following change in your medication:  START: Imdur 30 mg take one tablet by mouth daily.  *If you need a refill on your cardiac medications before your next appointment, please call your pharmacy*   Lab Work: Your physician recommends that you return for lab work in: TODAY CMP, Lipids, Lpa If you have labs (blood work) drawn today and your tests are completely normal, you will receive your results only by: Marland Kitchen MyChart Message (if you have MyChart) OR . A paper copy in the mail If you have any lab test that is abnormal or we need to change your treatment, we will call you to review the results.   Testing/Procedures: Your physician has requested that you have a carotid duplex. This test is an ultrasound of the carotid arteries in your neck. It looks at blood flow through these arteries that supply the brain with blood. Allow one hour for this exam. There are no restrictions or special instructions.     Follow-Up: At [facility], you and your health needs are our priority.  As part of our continuing mission to provide you with exceptional heart care, we have created designated Provider Care Teams.  These Care Teams include your primary Cardiologist (physician) and Advanced Practice Providers (APPs -  Physician Assistants and Nurse Practitioners) who all work together to provide you with the care you need, when you need it.  We recommend signing up for the patient portal called "MyChart".  Sign up information is provided on this After Visit Summary.  MyChart is used to connect with patients for Virtual Visits (Telemedicine).  Patients are able to view lab/test results, encounter notes, upcoming appointments, etc.  Non-urgent messages can be sent to your provider as well.   To learn more about what you can do with MyChart, go to [email or web address].    Your next appointment:   6 month(s)  The format for your next  appointment:   In Person  Provider:   Shirlee More, MD   Other Instructions

## 2021-01-23 ENCOUNTER — Other Ambulatory Visit: Payer: Self-pay

## 2021-01-23 ENCOUNTER — Ambulatory Visit (INDEPENDENT_AMBULATORY_CARE_PROVIDER_SITE_OTHER): Payer: Medicare Other

## 2021-01-23 DIAGNOSIS — I6523 Occlusion and stenosis of bilateral carotid arteries: Secondary | ICD-10-CM | POA: Diagnosis not present

## 2021-01-23 DIAGNOSIS — I10 Essential (primary) hypertension: Secondary | ICD-10-CM

## 2021-01-23 DIAGNOSIS — E7849 Other hyperlipidemia: Secondary | ICD-10-CM

## 2021-01-23 DIAGNOSIS — I2510 Atherosclerotic heart disease of native coronary artery without angina pectoris: Secondary | ICD-10-CM | POA: Diagnosis not present

## 2021-01-23 NOTE — Progress Notes (Signed)
Complete echocardiogram performed.  Jimmy Khalee Mazo RDCS, RVT  

## 2021-01-24 ENCOUNTER — Telehealth: Payer: Self-pay

## 2021-01-24 NOTE — Telephone Encounter (Signed)
-----   Message from Richardo Priest, MD sent at 01/24/2021 12:11 PM EST ----- Good result she has had a CEA.  We will need to recheck in 1 year

## 2021-01-24 NOTE — Telephone Encounter (Signed)
Left message on patients voicemail to please return our call.   

## 2021-01-27 ENCOUNTER — Telehealth: Payer: Self-pay

## 2021-01-27 NOTE — Telephone Encounter (Signed)
-----   Message from Richardo Priest, MD sent at 01/24/2021 12:11 PM EST ----- Good result she has had a CEA.  We will need to recheck in 1 year

## 2021-01-27 NOTE — Telephone Encounter (Signed)
Spoke with patient regarding results and recommendation.  Patient verbalizes understanding and is agreeable to plan of care. Advised patient to call back with any issues or concerns.  

## 2021-03-24 ENCOUNTER — Inpatient Hospital Stay: Payer: Medicare Other | Attending: Hematology and Oncology | Admitting: Hematology and Oncology

## 2021-03-24 ENCOUNTER — Inpatient Hospital Stay: Payer: Medicare Other

## 2021-03-24 ENCOUNTER — Telehealth: Payer: Self-pay | Admitting: Hematology and Oncology

## 2021-03-24 ENCOUNTER — Other Ambulatory Visit: Payer: Self-pay

## 2021-03-24 VITALS — BP 160/73 | HR 87 | Temp 98.5°F | Resp 18 | Ht 64.0 in | Wt 275.4 lb

## 2021-03-24 DIAGNOSIS — Z170 Estrogen receptor positive status [ER+]: Secondary | ICD-10-CM | POA: Diagnosis not present

## 2021-03-24 DIAGNOSIS — C50412 Malignant neoplasm of upper-outer quadrant of left female breast: Secondary | ICD-10-CM

## 2021-03-24 NOTE — Telephone Encounter (Signed)
Per 4/11 LOS, patient scheduled for Aug Appt's.  Gave patient Appt Summary 

## 2021-03-24 NOTE — Progress Notes (Signed)
[facility]  [address] [place],  [place]  [address] [phone]  Clinic Day:  03/24/2021  Referring physician: [facility]*   CHIEF COMPLAINT:  CC:  Bilateral stage IA hormone receptor positive breast cancers  Current Treatment:   Anastrozole 1 mg daily   HISTORY OF PRESENT ILLNESS:  RAJAH LAMBA is a 60 y.o. 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  We began seeing her in February.  Her disease was discovered on mammography.  There was an 8 mm mass within the right breast,which appeared suspicious, and a 6 mm indeterminate mass within the left breast, as well as an area of distortion within the left breast. 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  The right breast lesion had positive estrogen and progesterone receptors, negative HER 2 and a Ki 67 of 10%. The left breast had positive estrogen and progesterone receptors, negative HER 2 and Ki 67 of 5%.  She developed a stroke after her biopsy and was transferred to [facility], where she had a carotid endarterectomy.  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  She follows with a vascular surgeon. She finally had bilateral lumpectomies performed in March.  Pathology of the right breast revealed a 12 mm, grade 2, invasive ductal carcinoma with negative sentinel node.  Pathology of the left breast revealed a 12 mm, grade 2, invasive ductal carcinoma with negative sentinel node.  Margins were clear.  Adjuvant chemotherapy was not recommended, however, she did receive adjuvant radiation to the bilateral breasts, completed in August.    Due to her personal and family history of malignancy, she underwent testing for hereditary cancer syndromes with the [employer] myRisk hereditary cancer panel test in June.  This did not reveal any clinically significant mutation or  variants of uncertain significance.  Due to her history of stroke, we felt tamoxifen would be contraindicated due to the increased risk of thrombosis seen with it.  She had a bone density scan in September 2020, which revealed normal bone density. She was placed on anastrozole 1 mg daily in December 2020.  She had a palpate a mass in the upper outer quadrant of her right breast in late 2020, and spot tangential view of this region shows a circumscribed lucent mass measuring 1.8 cm which was consistent with benign fat necrosis.  No evidence of malignancy was identified.   She has continued to smoke 1 and half packs of cigarettes daily.  She has smoked since age 53 and at least 1 pack per day for 30 years.  We have given her information on the [facility].  She has COPD and sleep apnea, and uses a C-PAP mask.  Low dose CT for lung cancer screening from September 2021 revealed Lung-RADS 2, benign appearance or behavior.  INTERVAL HISTORY:  [name] is here today for repeat examination and states she continues anastrozole daily without significant difficulty. She has rarely missed a dose.  She denies any changes in her breasts.  She reports chronic dyspnea with exertion due to COPD, which is stable. She reports stable chronic lower back pain which is managed by her primary care provider. She denies fevers or chills. She denies pain. Her appetite is good. Her weight has increased 7 pounds over last 4 months. She had a bilateral diagnostic mammogram in December that did not reveal any  evidence of malignancy. Review of her records reveals she had a CT a chest in January which did not reveal any evidence of pulmonary embolism or other acute abnormality.  There was a 4 mm subpleural nodule in the left lateral lung base , which was unchanged from previous CT screening. She will not need repeat CT lung cancer screening until January 2023.  REVIEW OF SYSTEMS:  Review of Systems  Constitutional: Negative for  appetite change, chills, fatigue, fever and unexpected weight change.  HENT:   Negative for lump/mass, mouth sores and sore throat.   Respiratory: Positive for shortness of breath (Chronic, stable). Negative for cough.   Cardiovascular: Negative for chest pain and leg swelling.  Gastrointestinal: Negative for abdominal pain, constipation, diarrhea, nausea and vomiting.  Endocrine: Negative for hot flashes.  Genitourinary: Negative for difficulty urinating, dysuria, frequency and hematuria.   Musculoskeletal: Positive for back pain (Chronic, stable). Negative for arthralgias and myalgias.  Skin: Negative for rash.  Neurological: Negative for dizziness and headaches.  Hematological: Negative for adenopathy. Does not bruise/bleed easily.  Psychiatric/Behavioral: Negative for depression and sleep disturbance. The patient is not nervous/anxious.      VITALS:  There were no vitals taken for this visit.  Wt Readings from Last 3 Encounters:  12/27/20 268 lb (121.6 kg)  11/22/20 268 lb 9.6 oz (121.8 kg)  05/27/20 274 lb 12.8 oz (124.6 kg)    There is no height or weight on file to calculate BMI.  Performance status (ECOG): 1 - Symptomatic but completely ambulatory  PHYSICAL EXAM:  Physical Exam Vitals and nursing note reviewed.  Constitutional:      General: She is not in acute distress.    Appearance: Normal appearance.  HENT:     Head: Normocephalic and atraumatic.     Mouth/Throat:     Mouth: Mucous membranes are moist.     Pharynx: Oropharynx is clear. No oropharyngeal exudate or posterior oropharyngeal erythema.  Eyes:     General: No scleral icterus.    Extraocular Movements: Extraocular movements intact.     Conjunctiva/sclera: Conjunctivae normal.     Pupils: Pupils are equal, round, and reactive to light.  Cardiovascular:     Rate and Rhythm: Normal rate and regular rhythm.     Heart sounds: Normal heart sounds. No murmur heard. No friction rub. No gallop.   Pulmonary:      Effort: Pulmonary effort is normal.     Breath sounds: Normal breath sounds. No wheezing, rhonchi or rales.  Chest:  Breasts:     Right: Inverted nipple present. No swelling, bleeding, mass, nipple discharge, skin change, tenderness, axillary adenopathy or supraclavicular adenopathy.     Left: Inverted nipple present. No swelling, bleeding, mass, nipple discharge, skin change, tenderness, axillary adenopathy or supraclavicular adenopathy.      Comments:   Bilateral nipples are chronically inverted. Abdominal:     General: There is no distension.     Palpations: Abdomen is soft. There is no hepatomegaly, splenomegaly or mass.     Tenderness: There is no abdominal tenderness.  Musculoskeletal:        General: Normal range of motion.     Cervical back: Normal range of motion and neck supple. No tenderness.     Right lower leg: No edema.     Left lower leg: No edema.  Lymphadenopathy:     Cervical: No cervical adenopathy.     Upper Body:     Right upper body: No supraclavicular or axillary adenopathy.  Left upper body: No supraclavicular or axillary adenopathy.     Lower Body: No right inguinal adenopathy. No left inguinal adenopathy.  Skin:    General: Skin is warm and dry.     Coloration: Skin is not jaundiced.     Findings: No rash.  Neurological:     Mental Status: She is alert and oriented to person, place, and time.     Cranial Nerves: No cranial nerve deficit.  Psychiatric:        Mood and Affect: Mood normal.        Behavior: Behavior normal.        Thought Content: Thought content normal.     LABS:   CBC Latest Ref Rng & Units 11/22/2020 01/05/2019 01/04/2019  WBC - 8.4 14.8(H) 13.8(H)  Hemoglobin 12.0 - 16.0 13.0 11.7(L) 11.7(L)  Hematocrit 36 - 46 39 36.1 36.2  Platelets 150 - 399 340 220 240   CMP Latest Ref Rng & Units 11/22/2020 05/23/2020 01/05/2019  Glucose 70 - 99 mg/dL - - 96  BUN 4 - 21 28(A) - 13  Creatinine 0.5 - 1.1 0.8 1.40(H) 0.75  Sodium 137 -  147 136(A) - 132(L)  Potassium 3.4 - 5.3 4.5 - 4.5  Chloride 99 - 108 105 - 99  CO2 13 - 22 24(A) - 24  Calcium 8.7 - 10.7 9.3 - 8.2(L)  Total Protein 6.5 - 8.1 g/dL - - -  Total Bilirubin 0.3 - 1.2 mg/dL - - -  Alkaline Phos 25 - 125 86 - -  AST 13 - 35 53(A) - -  ALT 7 - 35 45(A) - -     No results found for: CEA1 / No results found for: CEA1 No results found for: PSA1 No results found for: YSA630 No results found for: ZSW109  No results found for: TOTALPROTELP, ALBUMINELP, A1GS, A2GS, BETS, BETA2SER, GAMS, MSPIKE, SPEI No results found for: TIBC, FERRITIN, IRONPCTSAT No results found for: LDH  STUDIES:  No results found.  Exam(s): [id number] MAM/MAM DIGITAL W/TOMO DIAG B CLINICAL DATA:  60 year old female presenting for annual exam. History of bilateral breast cancer in 2020 status post lumpectomy and radiation.  EXAM: DIGITAL DIAGNOSTIC BILATERAL MAMMOGRAM WITH TOMO AND CAD  COMPARISON:  Previous exam(s).  ACR Breast Density Category b: There are scattered areas of fibroglandular density.  FINDINGS: Right breast: A spot 2D magnification view of the lumpectomy site which were performed in addition to standard views. There are expected postsurgical changes. No suspicious mass, distortion, or microcalcifications are identified to suggest presence of malignancy.  Left breast: A spot 2D magnification view of the lumpectomy site was performed in addition to standard views. Additionally spot compression tomosynthesis views were performed to evaluate a previously biopsied area in the outer left breast. There are expected postsurgical and biopsy site changes. No suspicious mass, distortion, or microcalcifications are identified to suggest presence of malignancy.  Mammographic images were processed with CAD.  IMPRESSION: Expected postsurgical changes in the bilateral breasts. No mammographic evidence of malignancy bilaterally.  RECOMMENDATION: Diagnostic mammogram  is suggested in 1 year. (Code:DM-B-01Y)   Exam(s): [id number] CT/CT ANGIO CHEST CLINICAL DATA:  History of breast cancer.  Low oxygen saturation.  EXAM: CT ANGIOGRAPHY CHEST WITH CONTRAST  TECHNIQUE: Multidetector CT imaging of the chest was performed using the standard protocol during bolus administration of intravenous contrast. Multiplanar CT image reconstructions and MIPs were obtained to evaluate the vascular anatomy.  CONTRAST:  80 cc Isovue 370  COMPARISON:  08/30/2020  FINDINGS: Cardiovascular: Heart size is normal. No pericardial fluid. Extensive coronary artery calcification is noted. Extensive aortic atherosclerotic calcification is present. Pulmonary arterial opacification is good. There are no pulmonary emboli.  Mediastinum/Nodes: No mass or lymphadenopathy.  Lungs/Pleura: The lungs are clear except for minimal scarring in the right middle lobe and lingula. No infiltrate, collapse or effusion. No mass or significant nodule. 4 mm subpleural nodule at the left lateral lung base is unchanged, axial image 104.  Upper Abdomen: Negative.  Incidental gastric diverticulum.  Musculoskeletal: Ordinary mild thoracic degenerative changes.  Review of the MIP images confirms the above findings.  IMPRESSION: 1. No pulmonary emboli or other acute chest pathology. 2. Extensive coronary artery calcification. Aortic atherosclerosis. 3. Stable 4 mm subpleural nodule left lateral lung base, likely benign.  HISTORY:   Past Medical History:  Diagnosis Date  . AKI (acute kidney injury) ([place]) 12/30/2018  . Anxiety 10/12/2016  . Asthma   . Back pain   . CAD (coronary artery disease) 01/03/2019  . Carotid artery occlusion   . COPD (chronic obstructive pulmonary disease) ([place])   . CVA (cerebral vascular accident) ([place]) 12/30/2018  . 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 HTN (hypertension) 12/30/2018  . Hyperlipidemia   . Hypertension   . Hyponatremia 12/30/2018  .  Hypothyroid 02/03/2016  . Hypothyroidism   . Nonobstructive atherosclerosis of coronary artery 08/03/2019  . Stroke ([place])   . Tobacco use 07/09/2016    Past Surgical History:  Procedure Laterality Date  . BREAST BIOPSY  12/2018   at breast center in [place]  . CARDIAC CATHETERIZATION    . CAROTID ENDARTERECTOMY    . CARPAL TUNNEL RELEASE    . CYST EXCISION    . ENDARTERECTOMY Left 01/04/2019   Procedure: ENDARTERECTOMY CAROTID LEFT;  Surgeon: Serafina Mitchell, MD;  Location: [facility];  Service: Vascular;  Laterality: Left;  . LEFT HEART CATH AND CORONARY ANGIOGRAPHY N/A 01/03/2019   Procedure: LEFT HEART CATH AND CORONARY ANGIOGRAPHY;  Surgeon: Belva Crome, MD;  Location: [facility];  Service: Cardiovascular;  Laterality: N/A;  . PATCH ANGIOPLASTY Left 01/04/2019   Procedure: PATCH ANGIOPLASTY USING Rueben Bash BIOLOGIC PATCH;  Surgeon: Serafina Mitchell, MD;  Location: [facility];  Service: Vascular;  Laterality: Left;    Family History  Problem Relation Age of Onset  . High blood pressure Other   . Diabetes Other   . Cancer Other   . Stroke Other   . Heart disease Other   . Heart disease Mother   . Diabetes Mother   . Heart disease Father   . Diabetes Father   . Heart disease Sister   . Heart disease Brother   . Diabetes Brother     Social History:  reports that she has been smoking cigarettes. She has a 80.00 pack-year smoking history. She has never used smokeless tobacco. She reports previous alcohol use. She reports that she does not use drugs.The patient is alone today.  Allergies: No Known Allergies  Current Medications: Current Outpatient Medications  Medication Sig Dispense Refill  . albuterol (PROVENTIL) (2.5 MG/3ML) 0.083% nebulizer solution Use three times daily with nebulizer    . amLODipine (NORVASC) 5 MG tablet Take 5 mg by mouth daily.     Marland Kitchen anastrozole (ARIMIDEX) 1 MG tablet TAKE ONE TABLET BY MOUTH EVERY DAY 90 tablet 3  . aspirin EC 81 MG tablet Take  1 tablet (81 mg total) by mouth daily. 90 tablet 3  . atorvastatin (LIPITOR)  40 MG tablet Take 40 mg by mouth daily.    . busPIRone (BUSPAR) 5 MG tablet SMARTSIG:1 Tablet(s) By Mouth Twice Daily    . celecoxib (CELEBREX) 200 MG capsule Take 200 mg by mouth 2 (two) times daily.    . clobetasol cream (TEMOVATE) 8.14 % Apply 1 application topically 2 (two) times daily between meals as needed.    . cloNIDine (CATAPRES) 0.1 MG tablet Take 1 tablet daily if your systolic BP (top number) is greater than 180. 30 tablet 3  . famotidine (PEPCID) 40 MG tablet Take 40 mg by mouth at bedtime.    . isosorbide mononitrate (IMDUR) 30 MG 24 hr tablet Take 1 tablet (30 mg total) by mouth daily. 90 tablet 3  . levothyroxine (SYNTHROID) 300 MCG tablet Take by mouth.    Marland Kitchen lisinopril (PRINIVIL,ZESTRIL) 20 MG tablet Take 20 mg by mouth 2 (two) times daily.     . nitroGLYCERIN (NITROSTAT) 0.4 MG SL tablet Place 1 tablet (0.4 mg total) under the tongue every 5 (five) minutes as needed for chest pain. 25 tablet 11  . omeprazole (PRILOSEC) 40 MG capsule Take 40 mg by mouth daily.    Marland Kitchen oxyCODONE-acetaminophen (PERCOCET) 7.5-325 MG tablet Take 1 tablet by mouth 3 (three) times daily.     . promethazine-dextromethorphan (PROMETHAZINE-DM) 6.25-15 MG/5ML syrup Take 5 mLs by mouth 2 (two) times daily as needed.    . traZODone (DESYREL) 150 MG tablet Take 150 mg by mouth at bedtime.    . triamcinolone cream (KENALOG) 0.5 % Apply topically as needed.     No current facility-administered medications for this visit.     ASSESSMENT & PLAN:   Assessment:  1. Bilateral stage IA hormone receptor positive breast cancers, treated with bilateral lumpectomies followed by adjuvant radiation therapy.  She remains without evidence of recurrence.  She knows to continue anastrozole 1 mg daily for least a total of 5 years.  She will be due for bone density scan again in September.  2. Candidiasis of the bilateral inframammary areas and  left axilla, fairly well controlled with medication.  3. Tobacco abuse. We have discussed smoking cessation on multiple occasions.  Due to her 30+ pack-year history of smoking, she is undergoing low-dose CT chest screening for lung cancer. Recent CT a chest did not reveal any evidence for malignancy, so she will not need low-dose CT chest screening again until January 2023.  Plan:  She knows to continue anastrozole 1 mg daily.  We will plan to see her back in 4 months with a CBC and comprehensive metabolic panel.  The patient understands the plans discussed today and is in agreement with them.  She knows to contact our office if she develops concerns prior to her next appointment.    Marvia Pickles, PA-C

## 2021-03-28 ENCOUNTER — Encounter: Payer: Self-pay | Admitting: Hematology and Oncology

## 2021-05-13 ENCOUNTER — Telehealth: Payer: Self-pay

## 2021-05-13 NOTE — Telephone Encounter (Signed)
Pt LVM on nurse triage line yesterday (we were closed for Memorial Day). She is reporting bilateral breasts became red, swollen, painful & warm to touch. These symptoms started Friday. She did have temp of 101 Friday night.   I notified [facility], of above, who recommends pt to be seen at Urgent Care or PCP today if possible. Pt verbalized understanding & will call her PCP.

## 2021-07-17 NOTE — Progress Notes (Signed)
[name]  [address] [place],  [place]  [address] [phone]  Clinic Day:  07/24/2021  Referring physician: [facility]*  This document serves as a record of services personally performed by Hosie Poisson, MD. It was created on their behalf by Curry,Lauren E, a trained medical scribe. The creation of this record is based on the scribe's personal observations and the provider's statements to them.  CHIEF COMPLAINT:  CC:  Bilateral stage IA hormone receptor positive breast cancers  Current Treatment:   Anastrozole 1 mg daily   HISTORY OF PRESENT ILLNESS:  Madison Medina is a 60 y.o. 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  We began seeing her in February.  Her disease was discovered on mammography.  There was an 8 mm mass within the right breast,which appeared suspicious, and a 6 mm indeterminate mass within the left breast, as well as an area of distortion within the left breast. 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  The right breast lesion had positive estrogen and progesterone receptors, negative HER 2 and a Ki 67 of 10%. The left breast had positive estrogen and progesterone receptors, negative HER 2 and Ki 67 of 5%.  She developed a stroke after her biopsy and was transferred to [facility], where she had a carotid endarterectomy.  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  She follows with a vascular surgeon. She finally had bilateral lumpectomies performed in March.  Pathology of the right breast revealed a 12 mm, grade 2, invasive ductal carcinoma with negative sentinel node.  Pathology of the left breast revealed a 12 mm, grade 2, invasive ductal carcinoma with negative sentinel node.  Margins were clear.  Adjuvant chemotherapy was not recommended, however, she did receive adjuvant radiation to  the bilateral breasts, completed in August.    Due to her personal and family history of malignancy, she underwent testing for hereditary cancer syndromes with the [facility] myRisk hereditary cancer panel test in June.  This did not reveal any clinically significant mutation or variants of uncertain significance.  Due to her history of stroke, we felt tamoxifen would be contraindicated due to the increased risk of thrombosis seen with it.  She had a bone density scan in September 2020, which revealed normal bone density. She was placed on anastrozole 1 mg daily in December 2020.  She had a palpable a mass in the upper outer quadrant of her right breast in late 2020, and spot tangential view of this region shows a circumscribed lucent mass measuring 1.8 cm which was consistent with benign fat necrosis.  No evidence of malignancy was identified.  She has continued to smoke 1 and half packs of cigarettes daily.  She has smoked since age 67 and at least 1 pack per day for 30 years.  We have given her information on the [place].  She has COPD and sleep apnea, and uses a C-PAP mask.  Low dose CT for lung cancer screening from September 2021 revealed Lung-RADS 2, benign appearance or behavior.  CT chest in January 2022 did not reveal any evidence of pulmonary embolism or other acute abnormality.  There was a 4 mm subpleural nodule in the left lateral lung base , which was unchanged from previous CT screening. She will not need repeat CT lung cancer screening until January 2023.  INTERVAL HISTORY:  Madison Medina is here for routine follow up and states that she is well other than struggling with depression.  She states that this is doing better now with medication.  She continues anastrozole daily without significant difficulty.  She has some rash of the underarms and underneath her breasts.  She uses a prescription cream and powders.  She undergoes routine blood work with her primary care office.  Her  appetite  is good, and she has lost 7 pounds since her last visit, but was retaining fluid.  She saw Dr. Bettina Gavia recently.  She is scheduled for annual mammogram and follow up with Dr. Lilia Pro in December.  She denies fever, chills or other signs of infection.  She denies nausea, vomiting, bowel issues, or abdominal pain.  She denies sore throat, cough, dyspnea, or chest pain.  REVIEW OF SYSTEMS:  Review of Systems  Constitutional: Negative.  Negative for appetite change, chills, fatigue, fever and unexpected weight change.  HENT:  Negative.    Eyes: Negative.   Respiratory: Negative.  Negative for chest tightness, cough, hemoptysis, shortness of breath and wheezing.   Cardiovascular: Negative.  Negative for chest pain, leg swelling and palpitations.  Gastrointestinal: Negative.  Negative for abdominal distention, abdominal pain, blood in stool, constipation, diarrhea, nausea and vomiting.  Endocrine: Negative.   Genitourinary: Negative.  Negative for difficulty urinating, dysuria, frequency and hematuria.   Musculoskeletal: Negative.  Negative for arthralgias, back pain, flank pain, gait problem and myalgias.  Skin: Negative.   Neurological: Negative.  Negative for dizziness, extremity weakness, gait problem, headaches, light-headedness, numbness, seizures and speech difficulty.  Hematological: Negative.   Psychiatric/Behavioral:  Positive for depression (better recently). Negative for sleep disturbance. The patient is not nervous/anxious.     VITALS:  Blood pressure (!) 197/86, pulse 87, temperature 98 F (36.7 C), temperature source Oral, resp. rate 18, height '5\' 4"'  (1.626 m), weight 286 lb 14.4 oz (130.1 kg), SpO2 93 %.  Wt Readings from Last 3 Encounters:  07/24/21 286 lb 14.4 oz (130.1 kg)  07/18/21 293 lb (132.9 kg)  03/24/21 275 lb 6.4 oz (124.9 kg)    Body mass index is 49.25 kg/m.  Performance status (ECOG): 1 - Symptomatic but completely ambulatory  PHYSICAL EXAM:  Physical  Exam Constitutional:      General: She is not in acute distress.    Appearance: Normal appearance. She is normal weight.  HENT:     Head: Normocephalic and atraumatic.  Eyes:     General: No scleral icterus.    Extraocular Movements: Extraocular movements intact.     Conjunctiva/sclera: Conjunctivae normal.     Pupils: Pupils are equal, round, and reactive to light.  Neck:     Comments: Well healed scar from left endarterectomy Cardiovascular:     Rate and Rhythm: Normal rate and regular rhythm.     Pulses: Normal pulses.     Heart sounds: Normal heart sounds. No murmur heard.   No friction rub. No gallop.  Pulmonary:     Effort: Pulmonary effort is normal. No respiratory distress.     Breath sounds: Wheezing (slight) present.  Chest:  Breasts:    Right: Normal.     Left: Normal.     Comments: Extensive radiation changes, more severe on the left, with lymphedema of the left breast and diffuse erythema.  No tenderness or skin lesions.  She has a well healed scar in the upper outer quadrant of the left breast and of the left axilla.  She has a rash of the inframammary fold and bilateral axilla.  The right breast has a well healed scar at 9 o'clock. Abdominal:     General: Bowel sounds are normal. There is no distension.     Palpations: Abdomen is soft. There is no hepatomegaly, splenomegaly or mass.     Tenderness: There is no abdominal tenderness.  Musculoskeletal:        General: Normal range of motion.     Cervical back: Normal range of motion and neck supple.     Right lower leg: No edema.     Left lower leg: No edema.  Lymphadenopathy:     Cervical: No cervical adenopathy.  Skin:    General: Skin is warm and dry.  Neurological:     General: No focal deficit present.     Mental Status: She is alert and oriented to person, place, and time. Mental status is at baseline.  Psychiatric:        Mood and Affect: Mood normal.        Behavior: Behavior normal.        Thought  Content: Thought content normal.        Judgment: Judgment normal.    LABS:   CBC Latest Ref Rng & Units 07/24/2021 11/22/2020 01/05/2019  WBC - 7.9 8.4 14.8(H)  Hemoglobin 12.0 - 16.0 14.4 13.0 11.7(L)  Hematocrit 36 - 46 44 39 36.1  Platelets 150 - 399 311 340 220   CMP Latest Ref Rng & Units 07/24/2021 11/22/2020 05/23/2020  Glucose 70 - 99 mg/dL - - -  BUN 4 - 21 25(A) 28(A) -  Creatinine 0.5 - 1.1 1.0 0.8 1.40(H)  Sodium 137 - 147 138 136(A) -  Potassium 3.4 - 5.3 4.6 4.5 -  Chloride 99 - 108 100 105 -  CO2 13 - 22 29(A) 24(A) -  Calcium 8.7 - 10.7 8.9 9.3 -  Total Protein 6.5 - 8.1 g/dL - - -  Total Bilirubin 0.3 - 1.2 mg/dL - - -  Alkaline Phos 25 - 125 72 86 -  AST 13 - 35 65(A) 53(A) -  ALT 7 - 35 45(A) 45(A) -    STUDIES:  No results found.   HISTORY:   Allergies: No Known Allergies  Current Medications: Current Outpatient Medications  Medication Sig Dispense Refill   albuterol (PROVENTIL) (2.5 MG/3ML) 0.083% nebulizer solution Use three times daily with nebulizer     amLODipine (NORVASC) 5 MG tablet Take 5 mg by mouth daily.      anastrozole (ARIMIDEX) 1 MG tablet TAKE ONE TABLET BY MOUTH EVERY DAY 90 tablet 3   aspirin EC 81 MG tablet Take 1 tablet (81 mg total) by mouth daily. 90 tablet 3   atorvastatin (LIPITOR) 80 MG tablet Take 80 mg by mouth daily.     benzonatate (TESSALON) 100 MG capsule Take 100 mg by mouth 3 (three) times daily as needed for cough.     busPIRone (BUSPAR) 5 MG tablet SMARTSIG:1 Tablet(s) By Mouth Twice Daily     celecoxib (CELEBREX) 200 MG capsule Take 200 mg by mouth 2 (two) times daily.     clobetasol cream (TEMOVATE) 0.16 % Apply 1 application topically 2 (two) times daily between meals as needed.     cloNIDine (CATAPRES) 0.1 MG tablet Take 1 tablet daily if your systolic BP (top number) is greater than 180. 30 tablet 3   famotidine (PEPCID) 40 MG tablet Take 40 mg by mouth at bedtime.  isosorbide mononitrate (IMDUR) 30 MG 24 hr  tablet Take 1 tablet (30 mg total) by mouth daily. 90 tablet 3   levothyroxine (SYNTHROID) 300 MCG tablet Take 300 mcg by mouth daily before breakfast.     lisinopril (PRINIVIL,ZESTRIL) 20 MG tablet Take 20 mg by mouth 2 (two) times daily.      nitroGLYCERIN (NITROSTAT) 0.4 MG SL tablet Place 1 tablet (0.4 mg total) under the tongue every 5 (five) minutes as needed for chest pain. 25 tablet 11   omeprazole (PRILOSEC) 40 MG capsule Take 40 mg by mouth daily.     oxyCODONE-acetaminophen (PERCOCET) 7.5-325 MG tablet Take 1 tablet by mouth 3 (three) times daily.      promethazine-dextromethorphan (PROMETHAZINE-DM) 6.25-15 MG/5ML syrup Take 5 mLs by mouth 2 (two) times daily as needed for cough.     SYMBICORT 160-4.5 MCG/ACT inhaler Inhale 2 puffs into the lungs daily.     traZODone (DESYREL) 150 MG tablet Take 150 mg by mouth at bedtime.     triamcinolone cream (KENALOG) 0.5 % Apply topically as needed (itching).     No current facility-administered medications for this visit.     ASSESSMENT & PLAN:   Assessment:  1. Bilateral stage IA hormone receptor positive breast cancers, treated with bilateral lumpectomies followed by bilateral adjuvant radiation therapy.  She remains without evidence of recurrence.  She knows to continue anastrozole 1 mg daily for least a total of 5 years.  She will be due for bone density scan again in September, so we will schedule this for her.   2. Candidiasis of the bilateral inframammary areas and left axilla.   3. Tobacco abuse. We have discussed smoking cessation on multiple occasions.  Due to her 30+ pack-year history of smoking, she is undergoing low-dose CT chest screening for lung cancer. Recent CT chest did not reveal any evidence for malignancy, so she will not need low-dose CT chest screening again until January 2023.   4.  Severe carotid arteriosclerosis requiring endarterectomy on the left.  Plan:   She knows to continue anastrozole 1 mg daily.  I will  schedule her bone density for late September/early October.  She will see Dr. Lilia Pro for annual mammogram in December.  I will call her with her lab results.  We will plan to see her back in 5 months for repeat evaluation.  She will be due for low  dose CT chest lung cancer screening in January. The patient understands the plans discussed today and is in agreement with them.  She knows to contact our office if she develops concerns prior to her next appointment.   I, Rita Ohara, am acting as scribe for Derwood Kaplan, MD  Hosie Poisson MD Hematology/Oncology  I have reviewed this report as typed by the medical scribe, and it is complete and accurate.

## 2021-07-17 NOTE — Progress Notes (Signed)
Cardiology Office Note:    Date:  07/18/2021   ID:  Madison Medina, DOB 08-19-1961, MRN [id number]  PCP:  Darrol Jump, NP  Cardiologist:  Shirlee More, MD    Referring MD: [facility]*    ASSESSMENT:    1. Mild CAD   2. Familial combined hyperlipidemia   3. Essential hypertension   4. Other hyperlipidemia   5. Bilateral carotid artery stenosis    PLAN:    In order of problems listed above:  CAD is stable having no anginal discomfort continue medical therapy including her aspirin oral nitrate lipid-lowering therapy to see if I can access those labs and she likely needs a PCSK9 inhibitor plus or statin.  At this time I do not think she requires an ischemia evaluation BP at target continue current treatment calcium channel blocker clonidine Recheck her carotid duplex following left carotid endarterectomy and stroke Continue markedly elevated TSH with hypothyroidism managed by endocrinology I obtained her recent labs performed 07/11/2021 LDL cholesterol was 202 on a statin and the patient was coronary and carotid disease have her initiate a PCSK9 inhibitor.   Next appointment: 1 year   Medication Adjustments/Labs and Tests Ordered: Current medicines are reviewed at length with the patient today.  Concerns regarding medicines are outlined above.  Orders Placed This Encounter  Procedures   VAS US CAROTID    No orders of the defined types were placed in this encounter.   Chief Complaint  Patient presents with   Follow-up   Coronary Artery Disease     History of Present Illness:    Madison Medina is a 60 y.o. female with a hx of CAD mild nonobstructive hyperlipidemia familial hypertension and bilateral carotid stenosis.  She was last seen 12/27/2020.She had a left carotid endarterectomy performed after her stroke 12/30/2018 .Cath 12/2018 with LAD mid vessel 40-50% narrowing, RCA 40%, PDA 60%, LVEF >60%. She follows  with Dr. Hinton Rao at [facility] cancer program for  stage Ia bilateral hormone receptor positive breast cancer   Compliance with diet, lifestyle and medications: Yes She feels very weak and fatigued and she remains markedly hypothyroid on high-dose Synthroid.  I asked her to discuss with endocrine if she is unimproved taking active T3 Cytomel. She fatigues easily short of breath walking indoors and outdoors even has to stop and rest with her ADLs but is not having chest pain edema orthopnea palpitation or syncope. She relates recent labs in her PCP office where the lipids were elevated I Minna see if I can access those results before she leaves my office. Her last carotid duplex was 1 year ago and I will make arrangements to repeat it in my office.  At that time the right internal carotid artery was occluded and she had mild 1 to 39% stenosis on the left.  Recent labs [facility] endocrinology.  06/04/2021: TSH severely elevated 75.8 Past Medical History:  Diagnosis Date   AKI (acute kidney injury) ([place]) 12/30/2018   Anxiety 10/12/2016   Asthma    Back pain    CAD (coronary artery disease) 01/03/2019   Carotid artery occlusion    COPD (chronic obstructive pulmonary disease) ([facility])    CVA (cerebral vascular accident) ([place]) 12/30/2018   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   HTN (hypertension) 12/30/2018   Hyperlipidemia    Hypertension    Hyponatremia 12/30/2018   Hypothyroid 02/03/2016   Hypothyroidism    Nonobstructive atherosclerosis of coronary artery 08/03/2019   Stroke ([place])  Tobacco use 07/09/2016    Past Surgical History:  Procedure Laterality Date   BREAST BIOPSY  12/2018   at breast center in [place]     CYST EXCISION     ENDARTERECTOMY Left 01/04/2019   Procedure: ENDARTERECTOMY CAROTID LEFT;  Surgeon: Serafina Mitchell, MD;  Location: [facility] OR;  Service: Vascular;  Laterality: Left;   LEFT HEART CATH AND CORONARY ANGIOGRAPHY N/A  01/03/2019   Procedure: LEFT HEART CATH AND CORONARY ANGIOGRAPHY;  Surgeon: Belva Crome, MD;  Location: [facility] CV LAB;  Service: Cardiovascular;  Laterality: N/A;   PATCH ANGIOPLASTY Left 01/04/2019   Procedure: PATCH ANGIOPLASTY USING XENOSURE BIOLOGIC PATCH;  Surgeon: Serafina Mitchell, MD;  Location: [facility] OR;  Service: Vascular;  Laterality: Left;    Current Medications: Current Meds  Medication Sig   albuterol (PROVENTIL) (2.5 MG/3ML) 0.083% nebulizer solution Use three times daily with nebulizer   amLODipine (NORVASC) 5 MG tablet Take 5 mg by mouth daily.    anastrozole (ARIMIDEX) 1 MG tablet TAKE ONE TABLET BY MOUTH EVERY DAY   aspirin EC 81 MG tablet Take 1 tablet (81 mg total) by mouth daily.   atorvastatin (LIPITOR) 80 MG tablet Take 80 mg by mouth daily.   benzonatate (TESSALON) 100 MG capsule Take 100 mg by mouth 3 (three) times daily as needed for cough.   busPIRone (BUSPAR) 5 MG tablet SMARTSIG:1 Tablet(s) By Mouth Twice Daily   celecoxib (CELEBREX) 200 MG capsule Take 200 mg by mouth 2 (two) times daily.   clobetasol cream (TEMOVATE) [id number] % Apply 1 application topically 2 (two) times daily between meals as needed.   cloNIDine (CATAPRES) 0.1 MG tablet Take 1 tablet daily if your systolic BP (top number) is greater than 180.   famotidine (PEPCID) 40 MG tablet Take 40 mg by mouth at bedtime.   isosorbide mononitrate (IMDUR) 30 MG 24 hr tablet Take 1 tablet (30 mg total) by mouth daily.   levothyroxine (SYNTHROID) 300 MCG tablet Take 300 mcg by mouth daily before breakfast.   lisinopril (PRINIVIL,ZESTRIL) 20 MG tablet Take 20 mg by mouth 2 (two) times daily.    nitroGLYCERIN (NITROSTAT) 0.4 MG SL tablet Place 1 tablet (0.4 mg total) under the tongue every 5 (five) minutes as needed for chest pain.   omeprazole (PRILOSEC) 40 MG capsule Take 40 mg by mouth daily.   oxyCODONE-acetaminophen (PERCOCET) 7.5-325 MG tablet Take 1 tablet by mouth 3 (three) times daily.     promethazine-dextromethorphan (PROMETHAZINE-DM) 6.25-15 MG/5ML syrup Take 5 mLs by mouth 2 (two) times daily as needed for cough.   SYMBICORT 160-4.5 MCG/ACT inhaler Inhale 2 puffs into the lungs daily.   traZODone (DESYREL) 150 MG tablet Take 150 mg by mouth at bedtime.   triamcinolone cream (KENALOG) 0.5 % Apply topically as needed (itching).     Allergies:   Patient has no known allergies.   Social History   Socioeconomic History   Marital status: Married    Spouse name: Not on file   Number of children: Not on file   Years of education: Not on file   Highest education level: Not on file  Occupational History   Not on file  Tobacco Use   Smoking status: Every Day    Packs/day: 2.00    Years: 40.00    Pack years: 80.00    Types: Cigarettes   Smokeless tobacco: Never  Vaping Use   Vaping Use: Former  Substance and Sexual Activity   Alcohol use: Not Currently   Drug use: Never   Sexual activity: Not on file  Other Topics Concern   Not on file  Social History Narrative   Not on file   Social Determinants of Health   Financial Resource Strain: Not on file  Food Insecurity: Not on file  Transportation Needs: Not on file  Physical Activity: Not on file  Stress: Not on file  Social Connections: Not on file     Family History: The patient's family history includes Cancer in an other family member; Diabetes in her brother, father, mother, and another family member; Heart disease in her brother, father, mother, sister, and another family member; High blood pressure in an other family member; Stroke in an other family member. ROS:   Please see the history of present illness.    All other systems reviewed and are negative.  EKGs/Labs/Other Studies Reviewed:    The following studies were reviewed today:  Her EKG last visits were 12/27/2020 showed sinus rhythm and was normal Recent Labs: 11/22/2020: ALT 45; BUN 28; Creatinine 0.8; Hemoglobin 13.0; Platelets 340; Potassium  4.5; Sodium 136  Recent Lipid Panel    Component Value Date/Time   CHOL 245 (H) 12/31/2018 0529   TRIG 117 12/31/2018 0529   HDL 39 (L) 12/31/2018 0529   CHOLHDL 6.3 12/31/2018 0529   VLDL 23 12/31/2018 0529   LDLCALC 183 (H) 12/31/2018 0529    Physical Exam:    VS:  BP 120/70 (BP Location: Right Arm, Patient Position: Sitting, Cuff Size: Large)   Pulse 76   Ht '5\' 4"'$  (1.626 m)   Wt 293 lb (132.9 kg)   SpO2 91%   BMI 50.29 kg/m     Wt Readings from Last 3 Encounters:  07/18/21 293 lb (132.9 kg)  03/24/21 275 lb 6.4 oz (124.9 kg)  12/27/20 268 lb (121.6 kg)     GEN: She looks hypothyroid well nourished, well developed in no acute distress HEENT: Normal NECK: No JVD; No carotid bruits LYMPHATICS: No lymphadenopathy CARDIAC: Distant heart sounds RRR, no murmurs, rubs, gallops RESPIRATORY:  Clear to auscultation without rales, wheezing or rhonchi  ABDOMEN: Soft, non-tender, non-distended MUSCULOSKELETAL:  No edema; No deformity  SKIN: Warm and dry NEUROLOGIC:  Alert and oriented x 3 PSYCHIATRIC:  Normal affect    Signed, Shirlee More, MD  07/18/2021 8:40 AM    [place]

## 2021-07-18 ENCOUNTER — Ambulatory Visit: Payer: Medicare Other | Admitting: Cardiology

## 2021-07-18 ENCOUNTER — Other Ambulatory Visit: Payer: Self-pay

## 2021-07-18 ENCOUNTER — Encounter: Payer: Self-pay | Admitting: Cardiology

## 2021-07-18 VITALS — BP 120/70 | HR 76 | Ht 64.0 in | Wt 293.0 lb

## 2021-07-18 DIAGNOSIS — E7849 Other hyperlipidemia: Secondary | ICD-10-CM | POA: Diagnosis not present

## 2021-07-18 DIAGNOSIS — I10 Essential (primary) hypertension: Secondary | ICD-10-CM | POA: Diagnosis not present

## 2021-07-18 DIAGNOSIS — I6523 Occlusion and stenosis of bilateral carotid arteries: Secondary | ICD-10-CM

## 2021-07-18 DIAGNOSIS — I2510 Atherosclerotic heart disease of native coronary artery without angina pectoris: Secondary | ICD-10-CM

## 2021-07-18 NOTE — Patient Instructions (Signed)
Medication Instructions:  Your physician recommends that you continue on your current medications as directed. Please refer to the Current Medication list given to you today.  *If you need a refill on your cardiac medications before your next appointment, please call your pharmacy*   Lab Work: None If you have labs (blood work) drawn today and your tests are completely normal, you will receive your results only by: [place] (if you have MyChart) OR A paper copy in the mail If you have any lab test that is abnormal or we need to change your treatment, we will call you to review the results.   Testing/Procedures: Your physician has requested that you have a carotid duplex. This test is an ultrasound of the carotid arteries in your neck. It looks at blood flow through these arteries that supply the brain with blood. Allow one hour for this exam. There are no restrictions or special instructions.    Follow-Up: At [facility] [place], you and your health needs are our priority.  As part of our continuing mission to provide you with exceptional heart care, we have created designated Provider Care Teams.  These Care Teams include your primary Cardiologist (physician) and Advanced Practice Providers (APPs -  Physician Assistants and Nurse Practitioners) who all work together to provide you with the care you need, when you need it.  We recommend signing up for the patient portal called "MyChart".  Sign up information is provided on this After Visit Summary.  MyChart is used to connect with patients for Virtual Visits (Telemedicine).  Patients are able to view lab/test results, encounter notes, upcoming appointments, etc.  Non-urgent messages can be sent to your provider as well.   To learn more about what you can do with MyChart, go to [email or web address].    Your next appointment:   1 year(s)  The format for your next appointment:   In Person  Provider:   Shirlee More, MD   Other  Instructions

## 2021-07-23 ENCOUNTER — Other Ambulatory Visit: Payer: Self-pay | Admitting: Oncology

## 2021-07-23 DIAGNOSIS — C50412 Malignant neoplasm of upper-outer quadrant of left female breast: Secondary | ICD-10-CM

## 2021-07-24 ENCOUNTER — Encounter: Payer: Self-pay | Admitting: Oncology

## 2021-07-24 ENCOUNTER — Other Ambulatory Visit: Payer: Self-pay

## 2021-07-24 ENCOUNTER — Inpatient Hospital Stay: Payer: Medicare Other | Attending: Oncology

## 2021-07-24 ENCOUNTER — Inpatient Hospital Stay (INDEPENDENT_AMBULATORY_CARE_PROVIDER_SITE_OTHER): Payer: Medicare Other | Admitting: Oncology

## 2021-07-24 ENCOUNTER — Telehealth: Payer: Self-pay | Admitting: Oncology

## 2021-07-24 VITALS — BP 197/86 | HR 87 | Temp 98.0°F | Resp 18 | Ht 64.0 in | Wt 286.9 lb

## 2021-07-24 DIAGNOSIS — C50412 Malignant neoplasm of upper-outer quadrant of left female breast: Secondary | ICD-10-CM

## 2021-07-24 DIAGNOSIS — Z170 Estrogen receptor positive status [ER+]: Secondary | ICD-10-CM

## 2021-07-24 LAB — HEPATIC FUNCTION PANEL
ALT: 45 — AB (ref 7–35)
AST: 65 — AB (ref 13–35)
Alkaline Phosphatase: 72 (ref 25–125)
Bilirubin, Total: 0.4

## 2021-07-24 LAB — COMPREHENSIVE METABOLIC PANEL
Albumin: 4.3 (ref 3.5–5.0)
Calcium: 8.9 (ref 8.7–10.7)

## 2021-07-24 LAB — CBC: RBC: 4.7 (ref 3.87–5.11)

## 2021-07-24 LAB — BASIC METABOLIC PANEL WITH GFR
BUN: 25 — AB (ref 4–21)
CO2: 29 — AB (ref 13–22)
Chloride: 100 (ref 99–108)
Creatinine: 1 (ref 0.5–1.1)
Glucose: 92
Potassium: 4.6 (ref 3.4–5.3)
Sodium: 138 (ref 137–147)

## 2021-07-24 LAB — CBC AND DIFFERENTIAL
HCT: 44 (ref 36–46)
Hemoglobin: 14.4 (ref 12.0–16.0)
Neutrophils Absolute: 5.77
Platelets: 311 (ref 150–399)
WBC: 7.9

## 2021-07-24 NOTE — Telephone Encounter (Signed)
Per 8/11 LOS next appt scheduled and given to patient 

## 2021-07-29 ENCOUNTER — Telehealth: Payer: Self-pay

## 2021-07-29 ENCOUNTER — Other Ambulatory Visit: Payer: Self-pay

## 2021-07-29 ENCOUNTER — Ambulatory Visit (INDEPENDENT_AMBULATORY_CARE_PROVIDER_SITE_OTHER): Payer: Medicare Other

## 2021-07-29 DIAGNOSIS — I6523 Occlusion and stenosis of bilateral carotid arteries: Secondary | ICD-10-CM

## 2021-07-29 NOTE — Telephone Encounter (Signed)
-----   Message from Derwood Kaplan, MD sent at 07/28/2021  7:23 AM EDT ----- Regarding: call pt Tell her rest of labs are stable, still mildly abn liver tests

## 2021-07-31 ENCOUNTER — Ambulatory Visit: Payer: Medicare Other

## 2021-08-01 ENCOUNTER — Telehealth: Payer: Self-pay

## 2021-08-01 NOTE — Telephone Encounter (Signed)
Patient notified

## 2021-08-01 NOTE — Telephone Encounter (Signed)
-----   Message from Derwood Kaplan, MD sent at 07/28/2021  7:23 AM EDT ----- Regarding: call pt Tell her rest of labs are stable, still mildly abn liver tests

## 2021-08-08 ENCOUNTER — Telehealth: Payer: Self-pay | Admitting: Pharmacist

## 2021-08-08 ENCOUNTER — Other Ambulatory Visit: Payer: Self-pay

## 2021-08-08 ENCOUNTER — Ambulatory Visit (INDEPENDENT_AMBULATORY_CARE_PROVIDER_SITE_OTHER): Payer: Medicare Other | Admitting: Pharmacist

## 2021-08-08 DIAGNOSIS — I6529 Occlusion and stenosis of unspecified carotid artery: Secondary | ICD-10-CM

## 2021-08-08 DIAGNOSIS — E7849 Other hyperlipidemia: Secondary | ICD-10-CM

## 2021-08-08 DIAGNOSIS — I2510 Atherosclerotic heart disease of native coronary artery without angina pectoris: Secondary | ICD-10-CM

## 2021-08-08 MED ORDER — NITROGLYCERIN 0.4 MG SL SUBL: 0.4000 mg | SUBLINGUAL_TABLET | SUBLINGUAL | 11 refills | Status: DC | PRN

## 2021-08-08 MED ORDER — ROSUVASTATIN CALCIUM 40 MG PO TABS: 40.0000 mg | ORAL_TABLET | Freq: Every day | ORAL | 3 refills | Status: DC

## 2021-08-08 NOTE — Telephone Encounter (Signed)
Please complete prior authorization for:  Name of medication, dose, and frequency Repatha '140mg'$  SQ Q 14days or Praluent '150mg'$  SQ Q 14 days  Lab Orders Requested? yes  Which labs? Lipid panel in 2-3 months   Does patient need activated copay card? no

## 2021-08-08 NOTE — Progress Notes (Signed)
Patient ID: Madison Medina                 DOB: 1961/06/14                    MRN: [id number]     HPI: ANIE PLA is a 60 y.o. female patient referred to lipid clinic by Dr Bettina Gavia. PMH is significant for HTN, stroke, CAD, COPD, breast cancer, and smoking.  Patient has been managed on atorvastatin '80mg'$  without any decrease in LDL.  Patient presents today in good spirits.  Can not find current lab work, however Dr Joya Gaskins last note mentions an LDL of 202 on 7/29 despite high intensity statin.  Patient continues to smoke 1/2 pack per day.  Does not drink alcohol.  Had Covid recently and was prescribed Paxlovid.  While on Paxlovid she had to hold atorvastatin. She believes she held medication for about 10 days and since she restarted it she has felt nauseas after taking it.     Eats at restaurants for most of her meals.  Is not watching salt or sugar content.  Drinks black coffee throughout the day.   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      anastrozole (ARIMIDEX) 1 MG tablet TAKE ONE TABLET  BY MOUTH EVERY DAY 90 tablet 3   aspirin EC 81 MG tablet Take 1 tablet (81 mg total) by mouth daily. 90 tablet 3   atorvastatin (LIPITOR) 80 MG tablet Take 80 mg by mouth daily.     benzonatate (TESSALON) 100 MG capsule Take 100 mg by mouth 3 (three) times daily as needed for cough.     busPIRone (BUSPAR) 5 MG tablet SMARTSIG:1 Tablet(s) By Mouth Twice Daily     celecoxib (CELEBREX) 200 MG capsule Take 200 mg by mouth 2 (two) times daily.     clobetasol cream (TEMOVATE) [id number] % Apply 1 application topically 2 (two) times daily between meals as needed.     cloNIDine (CATAPRES) 0.1 MG tablet Take 1 tablet daily if your systolic BP (top number) is greater than 180. 30 tablet 3   famotidine (PEPCID) 40 MG tablet Take 40 mg by mouth at bedtime.     isosorbide mononitrate (IMDUR) 30 MG 24 hr tablet Take 1 tablet (30 mg total) by mouth daily. 90 tablet 3   levothyroxine (SYNTHROID) 300 MCG tablet Take 300 mcg by mouth daily before breakfast.     lisinopril (PRINIVIL,ZESTRIL) 20 MG tablet Take 20 mg by mouth 2 (two) times daily.  nitroGLYCERIN (NITROSTAT) 0.4 MG SL tablet Place 1 tablet (0.4 mg total) under the tongue every 5 (five) minutes as needed for chest pain. 25 tablet 11   omeprazole (PRILOSEC) 40 MG capsule Take 40 mg by mouth daily.     oxyCODONE-acetaminophen (PERCOCET) 7.5-325 MG tablet Take 1 tablet by mouth 3 (three) times daily.      promethazine-dextromethorphan (PROMETHAZINE-DM) 6.25-15 MG/5ML syrup Take 5 mLs by mouth 2 (two) times daily as needed for cough.     SYMBICORT 160-4.5 MCG/ACT inhaler Inhale 2 puffs into the lungs daily.     traZODone (DESYREL) 150 MG tablet Take 150 mg by mouth at bedtime.     triamcinolone cream (KENALOG) 0.5 % Apply topically as needed (itching).     No current facility-administered medications on file prior to visit.    No Known Allergies  Assessment/Plan:  1. Hyperlipidemia - Per Dr Joya Gaskins note, last LDL 202 which is well above goal of <70.   If attainable, <55 would be ideal.  Since patient has developed nausea from atorvastatin, is agreeable to switching to rosuvastatin '40mg'$  daily. However will need more aggressive therapy.  Using [employer], educated patient on mechanism of action, storage, site selection, administration, and possible side effects.  Patient was able to demonstrate in room.  Will complete PA and schedule follow up labs at [facility].  Patient voiced understanding.  Stop atorvastatin '80mg'$  daily Start rosuvastatin '40mg'$  daily Start Repatha/Praluent SQ Q 14 days Recheck lipid panel in 2-3 months  Karren Cobble, PharmD, BCACP, [place], [place] [id number], [place], [place] [address] Phone: [phone]; Fax: [phone] 08/08/2021 4:37 PM

## 2021-08-08 NOTE — Patient Instructions (Addendum)
It was nice meeting you today!  We would like to reduce your LDL (bad cholesterol) to lower than 70   We will switch your atorvastatin to rosuvastatin '40mg'$  once a day since you are having stomach issues  We would like to start you on a new medication for your cholesterol which you will inject once every 2 weeks  We will call you when the prior authorization is approved and then recheck your cholesterol in about 2-3 months  Please call if you have any questions!  Karren Cobble, PharmD, BCACP, [place], [place] [id number], [place], [place] [address] Phone: [phone]; Fax: [phone] 08/08/2021 11:52 AM

## 2021-08-12 MED ORDER — REPATHA SURECLICK 140 MG/ML ~~LOC~~ SOAJ
140.0000 mg | SUBCUTANEOUS | 11 refills | Status: DC
Start: 2021-08-12 — End: 2022-08-31

## 2021-08-12 NOTE — Telephone Encounter (Signed)
Called and spoke w/pt stated that the repatha was approved, rx sent, pt instructed to complete fasting lipids post 4th dose and to call if its unaffordable pt voiced understanding

## 2021-09-17 ENCOUNTER — Encounter: Payer: Self-pay | Admitting: Oncology

## 2021-09-19 ENCOUNTER — Telehealth: Payer: Self-pay

## 2021-09-19 NOTE — Telephone Encounter (Signed)
Patient notified

## 2021-09-19 NOTE — Telephone Encounter (Signed)
-----   Message from Derwood Kaplan, MD sent at 09/18/2021  7:42 PM EDT ----- Regarding: call pt Tell her she has just slight borderline thinning of the hip, bone density looks good

## 2021-10-16 ENCOUNTER — Other Ambulatory Visit: Payer: Self-pay | Admitting: *Deleted

## 2021-10-16 DIAGNOSIS — I6521 Occlusion and stenosis of right carotid artery: Secondary | ICD-10-CM

## 2021-10-23 ENCOUNTER — Telehealth: Payer: Self-pay | Admitting: Surgery

## 2021-10-23 NOTE — Telephone Encounter (Signed)
Pt called stated she does not want to follow up at the [facility] office for her Carotid's that her cardiologist comes to [facility] and he is willing to keep check on her. We cancelled her appts for 09/23/2021 and expired her recall.

## 2021-10-23 NOTE — Progress Notes (Deleted)
History of Present Illness:  Patient is a 61 y.o. year old female who presents for evaluation of carotid stenosis.   She is s/p left CEA in January of 2020 and presented with left MCA acute infarct. Post-op duplex showed patent endarterectomy and right ICA stenosis of 60-79%.  The patient denies symptoms of TIA, amaurosis, or stroke.    She denies symptoms of stoke/TIA.  She has no residual deficits with the exception occasionally finding it difficult to find words. She continues to smoke and is compliant with statin and asa 81mg .  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   HTN (hypertension) 12/30/2018   Hyperlipidemia    Hypertension    Hyponatremia 12/30/2018   Hypothyroid 02/03/2016   Hypothyroidism    Nonobstructive atherosclerosis of coronary artery 08/03/2019   Stroke ([place])    Tobacco use 07/09/2016    Past Surgical History:  Procedure Laterality Date   BREAST BIOPSY  12/2018   at breast center in [place]     ENDARTERECTOMY Left 01/04/2019   Procedure: ENDARTERECTOMY CAROTID LEFT;  Surgeon: Serafina Mitchell, MD;  Location: [facility] OR;  Service: Vascular;  Laterality: Left;   LEFT HEART CATH AND CORONARY ANGIOGRAPHY N/A 01/03/2019   Procedure: LEFT HEART CATH AND CORONARY ANGIOGRAPHY;  Surgeon: Belva Crome, MD;  Location: [place] [facility];  Service: Cardiovascular;  Laterality: N/A;   PATCH ANGIOPLASTY Left 01/04/2019   Procedure: PATCH ANGIOPLASTY USING XENOSURE BIOLOGIC PATCH;  Surgeon: Serafina Mitchell, MD;  Location: [facility] OR;  Service: Vascular;  Laterality: Left;      Social History Social History   Tobacco Use   Smoking status: Every Day    Packs/day: 2.00    Years: 40.00    Pack years: 80.00    Types: Cigarettes   Smokeless tobacco: Never  Vaping Use   Vaping Use: Former  Substance Use Topics   Alcohol use: Not Currently   Drug use: Never    Family History Family History  Problem Relation Age of Onset   High blood pressure Other    Diabetes Other    Cancer Other    Stroke Other    Heart disease Other    Heart disease Mother    Diabetes Mother    Heart disease Father    Diabetes Father    Heart disease Sister    Heart disease Brother    Diabetes Brother     Allergies  No Known Allergies   Current Outpatient Medications  Medication Sig Dispense Refill   albuterol (PROVENTIL) (2.5 MG/3ML) 0.083% nebulizer solution Use three times daily with nebulizer     amLODipine (NORVASC) 5 MG tablet Take 5 mg by mouth daily.      anastrozole (ARIMIDEX) 1 MG tablet TAKE ONE TABLET BY MOUTH EVERY DAY 90 tablet 3   aspirin EC 81 MG tablet Take 1 tablet (81 mg total) by mouth daily. 90 tablet 3   benzonatate (TESSALON) 100 MG capsule Take 100 mg by mouth 3 (three) times  daily as needed for cough.     busPIRone (BUSPAR) 5 MG tablet SMARTSIG:1 Tablet(s) By Mouth Twice Daily     celecoxib (CELEBREX) 200 MG capsule Take 200 mg by mouth 2 (two) times daily.     clobetasol cream (TEMOVATE) 3.87 % Apply 1 application topically 2 (two) times daily between meals as needed.     cloNIDine (CATAPRES) 0.1 MG tablet Take 1 tablet daily if your systolic BP (top number) is greater than 180. 30 tablet 3   Evolocumab (REPATHA SURECLICK) 564 MG/ML SOAJ Inject 140 mg into the skin every 14 (fourteen) days. 2 mL 11   famotidine (PEPCID) 40 MG tablet Take 40 mg by mouth at bedtime.     isosorbide mononitrate (IMDUR) 30 MG 24 hr tablet Take 1 tablet (30 mg total) by mouth daily. 90 tablet 3   levothyroxine (SYNTHROID) 300 MCG tablet Take 300 mcg by mouth daily  before breakfast.     lisinopril (PRINIVIL,ZESTRIL) 20 MG tablet Take 20 mg by mouth 2 (two) times daily.      nitroGLYCERIN (NITROSTAT) 0.4 MG SL tablet Place 1 tablet (0.4 mg total) under the tongue every 5 (five) minutes as needed for chest pain. 25 tablet 11   omeprazole (PRILOSEC) 40 MG capsule Take 40 mg by mouth daily.     oxyCODONE-acetaminophen (PERCOCET) 7.5-325 MG tablet Take 1 tablet by mouth 3 (three) times daily.      rosuvastatin (CRESTOR) 40 MG tablet Take 1 tablet (40 mg total) by mouth daily. 90 tablet 3   SYMBICORT 160-4.5 MCG/ACT inhaler Inhale 2 puffs into the lungs daily.     traZODone (DESYREL) 150 MG tablet Take 150 mg by mouth at bedtime.     triamcinolone cream (KENALOG) 0.5 % Apply topically as needed (itching).     No current facility-administered medications for this visit.    ROS:   General:  No weight loss, Fever, chills  HEENT: No recent headaches, no nasal bleeding, no visual changes, no sore throat  Neurologic: No dizziness, blackouts, seizures. No recent symptoms of stroke or mini- stroke. No recent episodes of slurred speech, or temporary blindness.  Cardiac: No recent episodes of chest pain/pressure, no shortness of breath at rest.  No shortness of breath with exertion.  Denies history of atrial fibrillation or irregular heartbeat  Vascular: No history of rest pain in feet.  No history of claudication.  No history of non-healing ulcer, No history of DVT   Pulmonary: No home oxygen, no productive cough, no hemoptysis,  No asthma or wheezing  Musculoskeletal:  [ ]  Arthritis, [ ]  Low back pain,  [ ]  Joint pain  Hematologic:No history of hypercoagulable state.  No history of easy bleeding.  No history of anemia  Gastrointestinal: No hematochezia or melena,  No gastroesophageal reflux, no trouble swallowing  Urinary: [ ]  chronic Kidney disease, [ ]  on HD - [ ]  MWF or [ ]  TTHS, [ ]  Burning with urination, [ ]  Frequent urination, [ ]  Difficulty  urinating;   Skin: No rashes  Psychological: No history of anxiety,  No history of depression   Physical Examination  There were no vitals filed for this visit.  There is no height or weight on file to calculate BMI.  General:  Alert and oriented, no acute distress HEENT: Normal Neck: No bruit or JVD Pulmonary: Clear to auscultation bilaterally Cardiac: Regular Rate and Rhythm without murmur Gastrointestinal: Soft, non-tender, non-distended, no mass, no scars Skin: No rash Extremity Pulses:  2+  radial, brachial, femoral, dorsalis pedis, posterior tibial pulses bilaterally Musculoskeletal: No deformity or edema  Neurologic: Upper and lower extremity motor 5/5 and symmetric  DATA:    ASSESSMENT:    PLAN:   Roxy Horseman PA-C [facility] Office: [phone]

## 2021-10-24 ENCOUNTER — Encounter (HOSPITAL_COMMUNITY): Payer: Medicare Other

## 2021-10-24 ENCOUNTER — Ambulatory Visit: Payer: Medicare Other

## 2021-10-28 ENCOUNTER — Other Ambulatory Visit: Payer: Self-pay | Admitting: Hematology and Oncology

## 2021-10-28 DIAGNOSIS — C50411 Malignant neoplasm of upper-outer quadrant of right female breast: Secondary | ICD-10-CM

## 2021-11-27 ENCOUNTER — Other Ambulatory Visit: Payer: Self-pay | Admitting: Cardiology

## 2021-11-27 DIAGNOSIS — E7849 Other hyperlipidemia: Secondary | ICD-10-CM

## 2021-11-27 DIAGNOSIS — I2510 Atherosclerotic heart disease of native coronary artery without angina pectoris: Secondary | ICD-10-CM

## 2021-11-27 DIAGNOSIS — I10 Essential (primary) hypertension: Secondary | ICD-10-CM

## 2021-11-27 DIAGNOSIS — I6523 Occlusion and stenosis of bilateral carotid arteries: Secondary | ICD-10-CM

## 2021-12-19 NOTE — Progress Notes (Signed)
[facility]  [address] [place],  [place]  [address] [phone]  Clinic Day:  12/24/2021  Referring physician: Darrol Jump, NP  This document serves as a record of services personally performed by Hosie Poisson, MD. It was created on their behalf by Curry,Lauren E, a trained medical scribe. The creation of this record is based on the scribe's personal observations and the provider's statements to them.  CHIEF COMPLAINT:  CC:  Bilateral stage IA hormone receptor positive breast cancers  Current Treatment:   Anastrozole 1 mg daily   HISTORY OF PRESENT ILLNESS:  Madison Medina is a 61 y.o. 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  We began seeing her in February.  Her disease was discovered on mammography.  There was an 8 mm mass within the right breast,which appeared suspicious, and a 6 mm indeterminate mass within the left breast, as well as an area of distortion within the left breast. 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  The right breast lesion had positive estrogen and progesterone receptors, negative HER 2 and a Ki 67 of 10%. The left breast had positive estrogen and progesterone receptors, negative HER 2 and Ki 67 of 5%.  She developed a stroke after her biopsy and was transferred to [facility], where she had a carotid endarterectomy.  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  She follows with a vascular surgeon. She finally had bilateral lumpectomies performed in March.  Pathology of the right breast revealed a 12 mm, grade 2, invasive ductal carcinoma with negative sentinel node.  Pathology of the left breast revealed a 12 mm, grade 2, invasive ductal carcinoma with negative sentinel node.  Margins were clear.  Adjuvant chemotherapy was not recommended, however, she did receive adjuvant radiation to the  bilateral breasts, completed in August.    Due to her personal and family history of malignancy, she underwent testing for hereditary cancer syndromes with the [employer] myRisk hereditary cancer panel test in June.  This did not reveal any clinically significant mutation or variants of uncertain significance.  Due to her history of stroke, we felt tamoxifen would be contraindicated due to the increased risk of thrombosis seen with it.  She had a bone density scan in September 2020, which revealed normal bone density. She was placed on anastrozole 1 mg daily in December 2020.  She had a palpable a mass in the upper outer quadrant of her right breast in late 2020, and spot tangential view of this region shows a circumscribed lucent mass measuring 1.8 cm which was consistent with benign fat necrosis.  No evidence of malignancy was identified.  She has continued to smoke 1 and half packs of cigarettes daily.  She has smoked since age 33 and at least 1 pack per day for 30 years.  We have given her information on the [facility].  She has COPD and sleep apnea, and uses a C-PAP mask.  Low dose CT for lung cancer screening from September 2021 revealed Lung-RADS 2, benign appearance or behavior.  CT chest in January 2022 did not reveal any evidence of pulmonary embolism or other acute abnormality.  There was a 4 mm subpleural nodule in the left lateral lung base , which was unchanged from previous CT screening. She will not need repeat CT lung cancer screening until January 2023.  INTERVAL HISTORY:  Madison Medina is here for routine follow up and states that she has been well but does report lower back pain, which she rates as a 7/10 today. She continues anastrozole daily without significant difficulty. Bone density from October 2022 revealed osteopenia of the left hip with a T-score of -1.2, previously -0.7. Dual femur total mean is -0.1, previously 0.1. AP spine and left forearm are both normal with a T-score of  0.1. She is currently taking oral calcium and vitamin D daily. Annual bilateral mammogram from December 2022 remains without evidence of malignancy. CT chest for lung cancer screening from January 6th revealed a stable benign 4 mm subpleural left lower lobe pulmonary nodule. She continues to smoke, up to 1 pack per day. She undergoes routine lab work with her primary care office. Her  appetite is good, and she has gained 3 pounds since her last visit.  She denies fever, chills or other signs of infection.  She denies nausea, vomiting, bowel issues, or abdominal pain.  She denies sore throat, cough, dyspnea, or chest pain.  REVIEW OF SYSTEMS:  Review of Systems  Constitutional: Negative.  Negative for appetite change, chills, fatigue, fever and unexpected weight change.  HENT:  Negative.    Eyes: Negative.   Respiratory: Negative.  Negative for chest tightness, cough, hemoptysis, shortness of breath and wheezing.   Cardiovascular: Negative.  Negative for chest pain, leg swelling and palpitations.  Gastrointestinal: Negative.  Negative for abdominal distention, abdominal pain, blood in stool, constipation, diarrhea, nausea and vomiting.  Endocrine: Negative.   Genitourinary: Negative.  Negative for difficulty urinating, dysuria, frequency and hematuria.   Musculoskeletal:  Positive for back pain (lower back). Negative for arthralgias, flank pain, gait problem and myalgias.  Skin: Negative.   Neurological: Negative.  Negative for dizziness, extremity weakness, gait problem, headaches, light-headedness, numbness, seizures and speech difficulty.  Hematological: Negative.   Psychiatric/Behavioral:  Negative for depression and sleep disturbance. The patient is not nervous/anxious.     VITALS:  Blood pressure (!) 190/88, pulse 76, temperature 97.8 F (36.6 C), temperature source Oral, resp. rate 20, height '5\' 4"'  (1.626 m), weight 289 lb 12.8 oz (131.5 kg), SpO2 94 %.  Wt Readings from Last 3 Encounters:   12/24/21 289 lb 12.8 oz (131.5 kg)  07/24/21 286 lb 14.4 oz (130.1 kg)  07/18/21 293 lb (132.9 kg)    Body mass index is 49.74 kg/m.  Performance status (ECOG): 1 - Symptomatic but completely ambulatory  PHYSICAL EXAM:  Physical Exam Constitutional:      General: She is not in acute distress.    Appearance: Normal appearance. She is normal weight.  HENT:     Head: Normocephalic and atraumatic.  Eyes:     General: No scleral icterus.    Extraocular Movements: Extraocular movements intact.     Conjunctiva/sclera: Conjunctivae normal.     Pupils: Pupils are equal, round, and reactive to light.  Neck:     Comments: Well healed scar from left endarterectomy Cardiovascular:     Rate and Rhythm: Normal rate and regular rhythm.     Pulses: Normal pulses.     Heart sounds: Normal heart sounds. No murmur heard.   No friction rub. No gallop.  Pulmonary:     Effort: Pulmonary effort is normal. No respiratory distress.     Breath sounds: No wheezing.  Chest:  Breasts:    Right: Normal.     Left: Inverted nipple present.     Comments: Fairly pronounced  radiation changes and edema of the skin of the left breast. She does have a large scar of the lower outer quadrant of the right breast and mild radiation changes. No masses in either breast. Abdominal:     General: Bowel sounds are normal. There is no distension.     Palpations: Abdomen is soft. There is no hepatomegaly, splenomegaly or mass.     Tenderness: There is no abdominal tenderness.  Musculoskeletal:        General: Normal range of motion.     Cervical back: Normal range of motion and neck supple.     Right lower leg: No edema.     Left lower leg: No edema.  Lymphadenopathy:     Cervical: No cervical adenopathy.  Skin:    General: Skin is warm and dry.  Neurological:     General: No focal deficit present.     Mental Status: She is alert and oriented to person, place, and time. Mental status is at baseline.  Psychiatric:         Mood and Affect: Mood normal.        Behavior: Behavior normal.        Thought Content: Thought content normal.        Judgment: Judgment normal.    LABS:   CBC Latest Ref Rng & Units 07/24/2021 11/22/2020 01/05/2019  WBC - 7.9 8.4 14.8(H)  Hemoglobin 12.0 - 16.0 14.4 13.0 11.7(L)  Hematocrit 36 - 46 44 39 36.1  Platelets 150 - 399 311 340 220   CMP Latest Ref Rng & Units 07/24/2021 11/22/2020 05/23/2020  Glucose 70 - 99 mg/dL - - -  BUN 4 - 21 25(A) 28(A) -  Creatinine 0.5 - 1.1 1.0 0.8 1.40(H)  Sodium 137 - 147 138 136(A) -  Potassium 3.4 - 5.3 4.6 4.5 -  Chloride 99 - 108 100 105 -  CO2 13 - 22 29(A) 24(A) -  Calcium 8.7 - 10.7 8.9 9.3 -  Total Protein 6.5 - 8.1 g/dL - - -  Total Bilirubin 0.3 - 1.2 mg/dL - - -  Alkaline Phos 25 - 125 72 86 -  AST 13 - 35 65(A) 53(A) -  ALT 7 - 35 45(A) 45(A) -    STUDIES:  No results found.    EXAM: 09/17/2021 DUAL X-RAY ABSORPTIOMETRY (DXA) FOR BONE MINERAL DENSITY   IMPRESSION:  Aiysha Hesser completed a BMD test on 09/17/2021 using the [place] (analysis version: 13.60) manufactured by [employer].  The following summarizes the results of our evaluation.  Technologist: APU   PATIENT BIOGRAPHICAL:  Name: Madison Medina, Madison Medina  Patient ID: [id number] [facility] Birth Date: Aug 15, 1961 Height: 64.0 in.  Gender: Female Exam Date: 09/17/2021 Weight: 290.0 lbs.  Indications: Tobacco User (Current Smoker) Fractures: Treatments:   ASSESSMENT:  The BMD measured at Femur Neck Left is 0.867 g/cm2 with a T-score of  -1.2. This patient is considered osteopenic according to [facility]) criteria.  The scan quality is good.  Site Region Measured Measured WHO Young Adult BMD  Date Age Classification T-score   AP Spine L1-L4 09/17/2021 61.0 Normal 0.1 1.197 g/cm2  AP Spine L1-L4 09/11/2019 59.0 Normal 0.0 1.177 g/cm2   DualFemur Neck Left 09/17/2021 61.0 Osteopenia -1.2 0.867 g/cm2  DualFemur Neck Left 09/11/2019 59.0  Normal -0.7 0.940 g/cm2   DualFemur Total Mean 09/17/2021 61.0 Normal -0.1 0.991 g/cm2  DualFemur Total Mean 09/11/2019 59.0 Normal 0.1 1.020 g/cm2   Left  Forearm Radius 33% 09/17/2021 61.0 Normal 0.1 0.886 g/cm2    EXAM: 12/01/2021 DIGITAL DIAGNOSTIC BILATERAL MAMMOGRAM WITH TOMOSYNTHESIS AND CAD   TECHNIQUE:  Bilateral digital diagnostic mammography and breast tomosynthesis  was performed. The images were evaluated with computer-aided  detection.   COMPARISON: Previous exam(s).   ACR Breast Density Category b: There are scattered areas of  fibroglandular density.   FINDINGS:  Full field CC and MLO views of both breasts and spot magnification  CC views of the lumpectomy sites in both breasts were obtained.  RIGHT: No findings suspicious for malignancy. Post lumpectomy  scar/architectural distortion in the upper-outer breast. Benign  dystrophic calcifications associated with fat necrosis in the low  axilla. Minimal residual post radiation skin thickening and  trabecular thickening.  LEFT: No findings suspicious for malignancy. Post lumpectomy  scar/architectural distortion in the upper-outer breast. Dystrophic  calcifications associated with fat necrosis in the low axilla.  Moderate to marked residual post radiation skin thickening and  trabecular thickening.   IMPRESSION:  1. No mammographic evidence of malignancy involving either breast.  2. Expected post lumpectomy changes in both breasts.  3. Residual moderate to marked post radiation changes in the LEFT  breast and minimal post radiation changes in the RIGHT breast.    EXAM: 12/19/2021 CT CHEST WITHOUT CONTRAST   TECHNIQUE:  Multidetector CT imaging of the chest was performed following the  standard protocol without IV contrast.   COMPARISON: 12/17/2020   FINDINGS:  Cardiovascular: Unenhanced imaging of the heart and great vessels  demonstrates no pericardial effusion. Normal caliber of the thoracic  aorta.  Stable atherosclerosis of the aorta and coronary vasculature.  Mediastinum/Nodes: No enlarged mediastinal or axillary lymph nodes.  Thyroid gland, trachea, and esophagus demonstrate no significant  findings.  Lungs/Pleura: No acute airspace disease, effusion, or pneumothorax.  Minimal subpleural scarring or atelectasis within the right upper  lobe, abutting the minor fissure and mediastinal contours. Stable 4  mm mean diameter left lower lobe pulmonary nodule, reference image  105/4. Given the long-term stability this can be considered benign.  No specific imaging follow-up is required. Central airways are  patent.  Upper Abdomen: Nonspecific subcentimeter lymph nodes are seen at the  porta hepatis. No acute upper abdominal findings.  Musculoskeletal: There are no acute or destructive bony lesions.  Reconstructed images demonstrate no additional findings.   IMPRESSION:  1. Stable benign 4 mm subpleural left lower lobe pulmonary nodule.  No specific imaging follow-up is required.  2. No acute intrathoracic process.  3. Aortic Atherosclerosis (ICD10-I70.0). Coronary artery  atherosclerosis.   HISTORY:   Allergies: No Known Allergies  Current Medications: Current Outpatient Medications  Medication Sig Dispense Refill   albuterol (PROVENTIL) (2.5 MG/3ML) 0.083% nebulizer solution Use three times daily with nebulizer     amLODipine (NORVASC) 5 MG tablet Take 5 mg by mouth daily.      anastrozole (ARIMIDEX) 1 MG tablet TAKE ONE TABLET BY MOUTH EVERY DAY 90 tablet 3   aspirin EC 81 MG tablet Take 1 tablet (81 mg total) by mouth daily. 90 tablet 3   benzonatate (TESSALON) 100 MG capsule Take 100 mg by mouth 3 (three) times daily as needed for cough.     busPIRone (BUSPAR) 5 MG tablet SMARTSIG:1 Tablet(s) By Mouth Twice Daily     celecoxib (CELEBREX) 200 MG capsule Take 200 mg by mouth 2 (two) times daily.     clobetasol cream (TEMOVATE) 6.30 % Apply 1 application topically 2 (two) times  daily  between meals as needed.     cloNIDine (CATAPRES) 0.1 MG tablet Take 1 tablet daily if your systolic BP (top number) is greater than 180. 30 tablet 3   Evolocumab (REPATHA SURECLICK) 161 MG/ML SOAJ Inject 140 mg into the skin every 14 (fourteen) days. 2 mL 11   famotidine (PEPCID) 40 MG tablet Take 40 mg by mouth at bedtime.     isosorbide mononitrate (IMDUR) 30 MG 24 hr tablet Take 1 tablet (30 mg total) by mouth daily. 90 tablet 3   levothyroxine (SYNTHROID) 300 MCG tablet Take 300 mcg by mouth daily before breakfast.     lisinopril (PRINIVIL,ZESTRIL) 20 MG tablet Take 20 mg by mouth 2 (two) times daily.      nitroGLYCERIN (NITROSTAT) 0.4 MG SL tablet Place 1 tablet (0.4 mg total) under the tongue every 5 (five) minutes as needed for chest pain. 25 tablet 11   omeprazole (PRILOSEC) 40 MG capsule Take 40 mg by mouth daily.     oxyCODONE-acetaminophen (PERCOCET) 7.5-325 MG tablet Take 1 tablet by mouth 3 (three) times daily.      rosuvastatin (CRESTOR) 40 MG tablet Take 1 tablet (40 mg total) by mouth daily. 90 tablet 3   SPIRIVA RESPIMAT 1.25 MCG/ACT AERS SMARTSIG:2 Inhalation By Mouth Daily     traZODone (DESYREL) 150 MG tablet Take 150 mg by mouth at bedtime.     triamcinolone cream (KENALOG) 0.5 % Apply topically as needed (itching).     Vitamin D, Cholecalciferol, 10 MCG (400 UNIT) TABS Take 2 tablets by mouth daily.     No current facility-administered medications for this visit.     ASSESSMENT & PLAN:   Assessment:  1. 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  She remains without evidence of recurrence.  She knows to continue anastrozole 1 mg daily for least a total of 5 years, until December 2025.     2. Tobacco abuse. We have discussed smoking cessation on multiple occasions.  Due to her 30+ pack-year history of smoking, she is undergoing annual  low-dose CT chest screening for lung cancer. Recent annual low-dose CT chest screening in January 2023 is stable.   3.  Severe carotid arteriosclerosis requiring endarterectomy on the left.  4.  Osteopenia, minimal. She continues oral calcium and vitamin D daily.  Plan:   She knows to continue anastrozole 1 mg daily.  We will plan to see her back in 6 months for repeat evaluation.  The patient understands the plans discussed today and is in agreement with them.  She knows to contact our office if she develops concerns prior to her next appointment.  I provided 15 minutes of face-to-face time during this this encounter and > 50% was spent counseling as documented under my assessment and plan.    I, Rita Ohara, am acting as scribe for Derwood Kaplan, MD  Hosie Poisson MD Hematology/Oncology  I have reviewed this report as typed by the medical scribe, and it is complete and accurate.

## 2021-12-24 ENCOUNTER — Other Ambulatory Visit: Payer: Self-pay

## 2021-12-24 ENCOUNTER — Telehealth: Payer: Self-pay | Admitting: Oncology

## 2021-12-24 ENCOUNTER — Inpatient Hospital Stay: Payer: Medicare Other | Attending: Oncology | Admitting: Oncology

## 2021-12-24 ENCOUNTER — Encounter: Payer: Self-pay | Admitting: Oncology

## 2021-12-24 VITALS — BP 190/88 | HR 76 | Temp 97.8°F | Resp 20 | Ht 64.0 in | Wt 289.8 lb

## 2021-12-24 DIAGNOSIS — Z170 Estrogen receptor positive status [ER+]: Secondary | ICD-10-CM | POA: Diagnosis not present

## 2021-12-24 DIAGNOSIS — C50412 Malignant neoplasm of upper-outer quadrant of left female breast: Secondary | ICD-10-CM

## 2021-12-24 DIAGNOSIS — C50411 Malignant neoplasm of upper-outer quadrant of right female breast: Secondary | ICD-10-CM | POA: Diagnosis not present

## 2021-12-24 NOTE — Telephone Encounter (Signed)
Per 1/11 los next appt scheduled and given to patient °

## 2022-01-15 ENCOUNTER — Telehealth: Payer: Self-pay

## 2022-01-15 NOTE — Telephone Encounter (Signed)
Lmom for labs regarding [id number] needs to be completed

## 2022-06-22 ENCOUNTER — Other Ambulatory Visit: Payer: Self-pay

## 2022-06-23 ENCOUNTER — Other Ambulatory Visit: Payer: Medicare Other

## 2022-06-23 ENCOUNTER — Ambulatory Visit: Payer: Medicare Other | Admitting: Hematology and Oncology

## 2022-06-24 ENCOUNTER — Inpatient Hospital Stay (INDEPENDENT_AMBULATORY_CARE_PROVIDER_SITE_OTHER): Payer: Medicare Other | Admitting: Hematology and Oncology

## 2022-06-24 ENCOUNTER — Telehealth: Payer: Self-pay | Admitting: Hematology and Oncology

## 2022-06-24 ENCOUNTER — Other Ambulatory Visit: Payer: Self-pay | Admitting: Hematology and Oncology

## 2022-06-24 ENCOUNTER — Inpatient Hospital Stay: Payer: Medicare Other | Attending: Hematology and Oncology

## 2022-06-24 ENCOUNTER — Encounter: Payer: Self-pay | Admitting: Hematology and Oncology

## 2022-06-24 DIAGNOSIS — Z170 Estrogen receptor positive status [ER+]: Secondary | ICD-10-CM | POA: Diagnosis not present

## 2022-06-24 DIAGNOSIS — C50412 Malignant neoplasm of upper-outer quadrant of left female breast: Secondary | ICD-10-CM

## 2022-06-24 DIAGNOSIS — Z720 Tobacco use: Secondary | ICD-10-CM

## 2022-06-24 DIAGNOSIS — C50411 Malignant neoplasm of upper-outer quadrant of right female breast: Secondary | ICD-10-CM | POA: Diagnosis not present

## 2022-06-24 LAB — CBC AND DIFFERENTIAL
HCT: 41 (ref 36–46)
Hemoglobin: 13.2 (ref 12.0–16.0)
Neutrophils Absolute: 6.25
Platelets: 295 10*3/uL (ref 150–400)
WBC: 8.8

## 2022-06-24 LAB — CBC: RBC: 4.38 (ref 3.87–5.11)

## 2022-06-24 LAB — BASIC METABOLIC PANEL
BUN: 22 — AB (ref 4–21)
CO2: 30 — AB (ref 13–22)
Chloride: 100 (ref 99–108)
Creatinine: 0.9 (ref 0.5–1.1)
Glucose: 98
Potassium: 4.7 mEq/L (ref 3.5–5.1)
Sodium: 136 — AB (ref 137–147)

## 2022-06-24 LAB — COMPREHENSIVE METABOLIC PANEL
Albumin: 4.2 (ref 3.5–5.0)
Calcium: 9.4 (ref 8.7–10.7)

## 2022-06-24 LAB — HEPATIC FUNCTION PANEL
ALT: 52 U/L — AB (ref 7–35)
AST: 68 — AB (ref 13–35)
Alkaline Phosphatase: 73 (ref 25–125)
Bilirubin, Total: 0.5

## 2022-06-24 MED ORDER — NYSTATIN 100000 UNIT/GM EX POWD: 1.0000 | Freq: Three times a day (TID) | CUTANEOUS | 0 refills | Status: DC

## 2022-06-24 NOTE — Progress Notes (Signed)
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  She remains without evidence of recurrence.  She knows to continue anastrozole 1 mg daily for least a total of 5 years, until December 2025.  She will return in 6 months for repeat evaluation.     Tobacco use Tobacco abuse. We have discussed smoking cessation on multiple occasions.  Due to her 30+ pack-year history of smoking, she is undergoing annual low-dose CT chest screening for lung cancer. Recent annual low-dose CT chest screening in June revealed suspicious 7-8 mm subpleural nodules; recommend repeat imaging in 3 months.    The patient understands the plans discussed today and is in agreement with them.  She knows to contact our office if she develops concerns prior to her next appointment.    [name], NP  [facility] [address] [place] [address] Dept: [phone] Dept Fax: [phone]   Orders Placed This Encounter  Procedures   CBC and differential    This external order was created through the Results Console.   CBC    This external order was created through the Results Console.   Basic metabolic panel    This external order was created through the Results Console.   Comprehensive metabolic panel    This external order was created through the Results Console.   Hepatic function panel    This external order was created  through the Results Console.      CHIEF COMPLAINT:  CC: A 61 year old female with history of breast cancer here for 6 month evaluation  Current Treatment:  Anastrazole  INTERVAL HISTORY:  Madison Medina is here today for repeat clinical assessment. She denies fevers or chills. She denies pain. Her appetite is good. Her weight has been stable.  I have reviewed the past medical history, past surgical history, social history and family history with the patient and they are unchanged from previous note.  ALLERGIES:  has No Known Allergies.  MEDICATIONS:  Current Outpatient Medications  Medication Sig Dispense Refill   nystatin (MYCOSTATIN/NYSTOP) powder Apply 1 Application topically 3 (three) times daily. 15 g 0   albuterol (PROVENTIL) (2.5 MG/3ML) 0.083% nebulizer solution Use three times daily with nebulizer     albuterol (VENTOLIN HFA) 108 (90 Base) MCG/ACT inhaler SMARTSIG:2 Puff(s) By Mouth Every 4-6 Hours PRN     amLODipine (NORVASC) 5 MG tablet Take 5 mg by mouth daily.      anastrozole (ARIMIDEX) 1 MG tablet TAKE ONE TABLET BY MOUTH EVERY DAY 90 tablet 3   aspirin EC 81 MG tablet Take 1 tablet (81 mg total) by mouth daily. 90 tablet 3   benzonatate (TESSALON) 100 MG capsule Take 100 mg by mouth 3 (three) times daily as needed for cough.     busPIRone (BUSPAR) 5 MG tablet SMARTSIG:1 Tablet(s) By Mouth Twice Daily     celecoxib (CELEBREX) 200 MG capsule Take 200 mg by mouth 2 (two) times daily.     clobetasol cream (TEMOVATE) 0.05 % Apply 1  application topically 2 (two) times daily between meals as needed.     cloNIDine (CATAPRES) 0.1 MG tablet Take 1 tablet daily if your systolic BP (top number) is greater than 180. 30 tablet 3   Evolocumab (REPATHA SURECLICK) 416 MG/ML SOAJ Inject 140 mg into the skin every 14 (fourteen) days. 2 mL 11   famotidine (PEPCID) 40 MG tablet Take 40 mg by mouth at bedtime.     isosorbide mononitrate (IMDUR) 30 MG 24 hr tablet Take 1 tablet (30 mg total) by mouth  daily. 90 tablet 3   levothyroxine (SYNTHROID) 300 MCG tablet Take 300 mcg by mouth daily before breakfast.     lisinopril (PRINIVIL,ZESTRIL) 20 MG tablet Take 20 mg by mouth 2 (two) times daily.      meloxicam (MOBIC) 7.5 MG tablet Take 7.5 mg by mouth daily.     nitroGLYCERIN (NITROSTAT) 0.4 MG SL tablet Place 1 tablet (0.4 mg total) under the tongue every 5 (five) minutes as needed for chest pain. 25 tablet 11   omeprazole (PRILOSEC) 40 MG capsule Take 40 mg by mouth daily.     oxybutynin (DITROPAN-XL) 10 MG 24 hr tablet Take 10 mg by mouth daily.     oxyCODONE-acetaminophen (PERCOCET) 7.5-325 MG tablet Take 1 tablet by mouth 3 (three) times daily.      rosuvastatin (CRESTOR) 40 MG tablet Take 1 tablet (40 mg total) by mouth daily. 90 tablet 3   SPIRIVA RESPIMAT 1.25 MCG/ACT AERS SMARTSIG:2 Inhalation By Mouth Daily     SYMBICORT 160-4.5 MCG/ACT inhaler Inhale into the lungs.     traZODone (DESYREL) 150 MG tablet Take 150 mg by mouth at bedtime.     triamcinolone cream (KENALOG) 0.5 % Apply topically as needed (itching).     Vitamin D, Cholecalciferol, 10 MCG (400 UNIT) TABS Take 2 tablets by mouth daily.     No current facility-administered medications for this visit.    HISTORY OF PRESENT ILLNESS:   Oncology History  Breast cancer in female [facility])  12/28/2018 Initial Diagnosis   Breast cancer in female [facility])   01/02/2019 Cancer Staging   Staging form: Breast, AJCC 8th Edition - Clinical stage from 01/02/2019: Stage IA (cT1c, cN0(sn), cM0, G2, ER+, PR+, HER2-) - Signed by Derwood Kaplan, MD on 11/22/2020   Breast cancer of upper-outer quadrant of left female breast ([place])  12/28/2018 Initial Diagnosis   Breast cancer of upper-outer quadrant of left female breast ([place])   01/02/2019 Cancer Staging   Staging form: Breast, AJCC 8th Edition - Clinical stage from 01/02/2019: Stage IA (cT1b, cN0, cM0, G2, ER+, PR+, HER2-) - Signed by Derwood Kaplan, MD on 11/22/2020        REVIEW OF SYSTEMS:   Constitutional: Denies fevers, chills or abnormal weight loss Eyes: Denies blurriness of vision Ears, nose, mouth, throat, and face: Denies mucositis or sore throat Respiratory: Denies cough, dyspnea or wheezes Cardiovascular: Denies palpitation, chest discomfort or lower extremity swelling Gastrointestinal:  Denies nausea, heartburn or change in bowel habits Skin: Denies abnormal skin rashes Lymphatics: Denies new lymphadenopathy or easy bruising Neurological:Denies numbness, tingling or new weaknesses Behavioral/Psych: Mood is stable, no new changes  All other systems were reviewed with the patient and are negative.   VITALS:  There were no vitals taken for this visit.  Wt Readings from Last 3 Encounters:  12/24/21 289 lb 12.8 oz (131.5 kg)  07/24/21 286 lb 14.4 oz (130.1 kg)  07/18/21 293 lb (132.9 kg)    There  is no height or weight on file to calculate BMI.  Performance status (ECOG): 1 - Symptomatic but completely ambulatory  PHYSICAL EXAM:   GENERAL:alert, no distress and comfortable SKIN: skin color, texture, turgor are normal, no rashes or significant lesions EYES: normal, Conjunctiva are pink and non-injected, sclera clear OROPHARYNX:no exudate, no erythema and lips, buccal mucosa, and tongue normal  NECK: supple, thyroid normal size, non-tender, without nodularity LYMPH:  no palpable lymphadenopathy in the cervical, axillary or inguinal LUNGS: clear to auscultation and percussion with normal breathing effort HEART: regular rate & rhythm and no murmurs and no lower extremity edema ABDOMEN:abdomen soft, non-tender and normal bowel sounds Musculoskeletal:no cyanosis of digits and no clubbing  NEURO: alert & oriented x 3 with fluent speech, no focal motor/sensory deficits  LABORATORY DATA:  I have reviewed the data as listed    Component Value Date/Time   NA 136 (A) 06/24/2022 0000   K 4.7 06/24/2022 0000   CL 100 06/24/2022 0000   CO2  30 (A) 06/24/2022 0000   GLUCOSE 96 01/05/2019 0445   BUN 22 (A) 06/24/2022 0000   CREATININE 0.9 06/24/2022 0000   CREATININE 1.40 (H) 05/23/2020 1710   CALCIUM 9.4 06/24/2022 0000   PROT 6.1 (L) 01/04/2019 1613   ALBUMIN 4.2 06/24/2022 0000   AST 68 (A) 06/24/2022 0000   ALT 52 (A) 06/24/2022 0000   ALKPHOS 73 06/24/2022 0000   BILITOT 0.8 01/04/2019 1613   GFRNONAA >60 01/05/2019 0445   GFRAA >60 01/05/2019 0445    No results found for: "SPEP", "UPEP"  Lab Results  Component Value Date   WBC 8.8 06/24/2022   NEUTROABS 6.25 06/24/2022   HGB 13.2 06/24/2022   HCT 41 06/24/2022   MCV 93.8 01/05/2019   PLT 295 06/24/2022      Chemistry      Component Value Date/Time   NA 136 (A) 06/24/2022 0000   K 4.7 06/24/2022 0000   CL 100 06/24/2022 0000   CO2 30 (A) 06/24/2022 0000   BUN 22 (A) 06/24/2022 0000   CREATININE 0.9 06/24/2022 0000   CREATININE 1.40 (H) 05/23/2020 1710   GLU 98 06/24/2022 0000      Component Value Date/Time   CALCIUM 9.4 06/24/2022 0000   ALKPHOS 73 06/24/2022 0000   AST 68 (A) 06/24/2022 0000   ALT 52 (A) 06/24/2022 0000   BILITOT 0.8 01/04/2019 1613       RADIOGRAPHIC STUDIES: I have personally reviewed the radiological images as listed and agreed with the findings in the report. No results found.

## 2022-06-24 NOTE — Assessment & Plan Note (Addendum)
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 She remains without evidence of recurrence. She knows to continue anastrozole 1 mg daily for least a total of 5 years, until December 2025. She will return in 6 months for repeat evaluation.

## 2022-06-24 NOTE — Assessment & Plan Note (Signed)
Tobacco abuse. We have discussed smoking cessation on multiple occasions. Due to her 30+ pack-year history of smoking, she is undergoing annual low-dose CT chest screening for lung cancer. Recent annual low-dose CT chest screening in June revealed suspicious 7-8 mm subpleural nodules; recommend repeat imaging in 3 months.

## 2022-06-24 NOTE — Telephone Encounter (Signed)
Per 06/24/22 los next appt scheduled and confirmed with patient 

## 2022-08-28 NOTE — Progress Notes (Unsigned)
Cardiology Office Note:    Date:  08/31/2022   ID:  Madison Medina, DOB Jan 15, 1961, MRN [id number]  PCP:  Martin Majestic, FNP  Cardiologist:  Shirlee More, MD    Referring MD: Martin Majestic, *    ASSESSMENT:    1. Mild CAD   2. Familial combined hyperlipidemia   3. Essential hypertension   4. Bilateral carotid artery stenosis   5. Current every day smoker    PLAN:    In order of problems listed above:  Stable CAD having no anginal discomfort on current treatment she will continue aspirin oral nitrate and hypertensive along with a high intensity statin.  She was intolerant of Repatha with GI side effects and we will place her on Zetia she relates recent labs at her PCP office I will request a lipid profile.  If continued severe elevation of LDL cholesterol consider Inclisirin therapy Stable hypertension continue ACE inhibitor Carotid disease followed by vascular surgery Unfortunately she tells me she has failed smoking cessation including nicotine substitutes she tells me she intends to pursue hypnosis   Next appointment: 1 year   Medication Adjustments/Labs and Tests Ordered: Current medicines are reviewed at length with the patient today.  Concerns regarding medicines are outlined above.  No orders of the defined types were placed in this encounter.  No orders of the defined types were placed in this encounter.   Chief Complaint  Patient presents with   Follow-up   Coronary Artery Disease    History of Present Illness:    Madison Medina is a 61 y.o. female with a hx of CAD mild nonobstructive hyperlipidemia familial hypertension and bilateral carotid stenosis last seen 07/18/2021.She had a left carotid endarterectomy performed after her stroke 12/30/2018 .Cath 12/2018 with LAD mid vessel 40-50% narrowing, RCA 40%, PDA 60%, LVEF >60%. She follows  with Dr. Hinton Rao at [facility] cancer program for stage Ia bilateral hormone receptor positive breast cancer  .  Compliance with diet, lifestyle and medications: Yes  She has had a good year she has had no angina edema shortness of breath chest pain palpitation or syncope  She had a cerebrovascular duplex reported 07/29/2021 there was occlusion of the right internal carotid artery minimal 1 to 39% stenosis left ICA and the right vertebral artery is not visualized. Past Medical History:  Diagnosis Date   AKI (acute kidney injury) ([place]) 12/30/2018   Anxiety 10/12/2016   Asthma    Back pain    CAD (coronary artery disease) 01/03/2019   Carotid artery occlusion    COPD (chronic obstructive pulmonary disease) ([facility])    CVA (cerebral vascular accident) ([place]) 12/30/2018   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   HTN (hypertension) 12/30/2018   Hyperlipidemia    Hypertension    Hyponatremia 12/30/2018   Hypothyroid 02/03/2016   Hypothyroidism    Nonobstructive atherosclerosis of coronary artery 08/03/2019   Stroke ([place])    Tobacco use 07/09/2016    Past Surgical History:  Procedure Laterality Date   BREAST BIOPSY  12/2018   at breast center in [place]     ENDARTERECTOMY Left 01/04/2019   Procedure: ENDARTERECTOMY CAROTID LEFT;  Surgeon: Serafina Mitchell, MD;  Location: [facility];  Service: Vascular;  Laterality: Left;   LEFT HEART CATH AND CORONARY ANGIOGRAPHY N/A 01/03/2019   Procedure: LEFT HEART CATH AND CORONARY ANGIOGRAPHY;  Surgeon: Belva Crome, MD;  Location: [facility];  Service: Cardiovascular;  Laterality: N/A;   PATCH ANGIOPLASTY Left 01/04/2019   Procedure: PATCH ANGIOPLASTY USING Rueben Bash BIOLOGIC PATCH;  Surgeon: Serafina Mitchell, MD;  Location: [facility];  Service: Vascular;  Laterality: Left;    Current Medications: Current Meds  Medication Sig   albuterol (PROVENTIL) (2.5 MG/3ML) 0.083% nebulizer solution Use three times daily with nebulizer   albuterol (VENTOLIN HFA)  108 (90 Base) MCG/ACT inhaler SMARTSIG:2 Puff(s) By Mouth Every 4-6 Hours PRN   amLODipine (NORVASC) 5 MG tablet Take 5 mg by mouth daily.    anastrozole (ARIMIDEX) 1 MG tablet TAKE ONE TABLET BY MOUTH EVERY DAY   aspirin EC 81 MG tablet Take 1 tablet (81 mg total) by mouth daily.   benzonatate (TESSALON) 100 MG capsule Take 100 mg by mouth 3 (three) times daily as needed for cough.   busPIRone (BUSPAR) 5 MG tablet SMARTSIG:1 Tablet(s) By Mouth Twice Daily   celecoxib (CELEBREX) 200 MG capsule Take 200 mg by mouth 2 (two) times daily.   clobetasol cream (TEMOVATE) 7.82 % Apply 1 application topically 2 (two) times daily between meals as needed.   cloNIDine (CATAPRES) 0.1 MG tablet Take 1 tablet daily if your systolic BP (top number) is greater than 180.   famotidine (PEPCID) 40 MG tablet Take 40 mg by mouth at bedtime.   isosorbide mononitrate (IMDUR) 30 MG 24 hr tablet Take 1 tablet (30 mg total) by mouth daily.   levothyroxine (SYNTHROID) 300 MCG tablet Take 300 mcg by mouth daily before breakfast.   lisinopril (PRINIVIL,ZESTRIL) 20 MG tablet Take 20 mg by mouth 2 (two) times daily.    meloxicam (MOBIC) 7.5 MG tablet Take 7.5 mg by mouth daily.   nitroGLYCERIN (NITROSTAT) 0.4 MG SL tablet Place 1 tablet (0.4 mg total) under the tongue every 5 (five) minutes as needed for chest pain.   nystatin (MYCOSTATIN/NYSTOP) powder Apply 1 Application topically 3 (three) times daily.   omeprazole (PRILOSEC) 40 MG capsule Take 40 mg by mouth daily.   oxybutynin (DITROPAN-XL) 10 MG 24 hr tablet Take 10 mg by mouth daily.   oxyCODONE-acetaminophen (PERCOCET) 7.5-325 MG tablet Take 1 tablet by mouth 3 (three) times daily.    rosuvastatin (CRESTOR) 40 MG tablet Take 1 tablet (40 mg total) by mouth daily.   SPIRIVA RESPIMAT 1.25 MCG/ACT AERS SMARTSIG:2 Inhalation By Mouth Daily   SYMBICORT 160-4.5 MCG/ACT inhaler Inhale into the lungs.   traZODone (DESYREL) 150 MG tablet Take 150 mg by mouth at bedtime.    triamcinolone cream (KENALOG) 0.5 % Apply topically as needed (itching).   Vitamin D, Cholecalciferol, 10 MCG (400 UNIT) TABS Take 2 tablets by mouth daily.     Allergies:   Repatha [evolocumab]   Social History   Socioeconomic History   Marital status: Married    Spouse name: Not on file   Number of children: Not on file   Years of education: Not on file   Highest education level: Not on file  Occupational History   Not on file  Tobacco Use   Smoking status: Every Day    Packs/day: 2.00    Years: 40.00    Total pack years: 80.00    Types: Cigarettes   Smokeless tobacco: Never  Vaping Use   Vaping Use: Former  Substance and Sexual Activity   Alcohol use: Not Currently   Drug use: Never   Sexual activity: Not on file  Other Topics  Concern   Not on file  Social History Narrative   Not on file   Social Determinants of Health   Financial Resource Strain: Not on file  Food Insecurity: Not on file  Transportation Needs: Not on file  Physical Activity: Not on file  Stress: Not on file  Social Connections: Not on file     Family History: The patient's family history includes Cancer in an other family member; Diabetes in her brother, father, mother, and another family member; Heart disease in her brother, father, mother, sister, and another family member; High blood pressure in an other family member; Stroke in an other family member. ROS:   Please see the history of present illness.    All other systems reviewed and are negative.  EKGs/Labs/Other Studies Reviewed:    The following studies were reviewed today:  EKG:  EKG ordered today and personally reviewed.  The ekg ordered today demonstrates sinus rhythm minor nonspecific ST abnormality  Recent Labs: 06/24/2022: ALT 52; BUN 22; Creatinine 0.9; Hemoglobin 13.2; Platelets 295; Potassium 4.7; Sodium 136  Recent Lipid Panel    Component Value Date/Time   CHOL 245 (H) 12/31/2018 0529   TRIG 117 12/31/2018 0529   HDL  39 (L) 12/31/2018 0529   CHOLHDL 6.3 12/31/2018 0529   VLDL 23 12/31/2018 0529   LDLCALC 183 (H) 12/31/2018 0529    Physical Exam:    VS:  BP 128/82 (BP Location: Right Arm, Patient Position: Sitting)   Pulse 85   Ht '5\' 4"'$  (1.626 m)   Wt 282 lb (127.9 kg)   SpO2 95%   BMI 48.41 kg/m     Wt Readings from Last 3 Encounters:  08/31/22 282 lb (127.9 kg)  06/24/22 277 lb 6.4 oz (125.8 kg)  12/24/21 289 lb 12.8 oz (131.5 kg)     GEN: Obese BMI exceeds 48 well nourished, well developed in no acute distress HEENT: Normal NECK: No JVD; No carotid bruits LYMPHATICS: No lymphadenopathy CARDIAC: RRR, no murmurs, rubs, gallops RESPIRATORY: Diminished breath sounds prolonged expiration no rales rhonchi or wheezing ABDOMEN: Soft, non-tender, non-distended MUSCULOSKELETAL:  No edema; No deformity  SKIN: Warm and dry NEUROLOGIC:  Alert and oriented x 3 PSYCHIATRIC:  Normal affect    Signed, Shirlee More, MD  08/31/2022 8:46 AM    [place]

## 2022-08-31 ENCOUNTER — Encounter: Payer: Self-pay | Admitting: Cardiology

## 2022-08-31 ENCOUNTER — Ambulatory Visit: Payer: Medicare Other | Attending: Cardiology | Admitting: Cardiology

## 2022-08-31 VITALS — BP 128/82 | HR 85 | Ht 64.0 in | Wt 282.0 lb

## 2022-08-31 DIAGNOSIS — I10 Essential (primary) hypertension: Secondary | ICD-10-CM

## 2022-08-31 DIAGNOSIS — I6523 Occlusion and stenosis of bilateral carotid arteries: Secondary | ICD-10-CM | POA: Diagnosis not present

## 2022-08-31 DIAGNOSIS — E7849 Other hyperlipidemia: Secondary | ICD-10-CM | POA: Diagnosis not present

## 2022-08-31 DIAGNOSIS — I2510 Atherosclerotic heart disease of native coronary artery without angina pectoris: Secondary | ICD-10-CM | POA: Diagnosis not present

## 2022-08-31 DIAGNOSIS — I6529 Occlusion and stenosis of unspecified carotid artery: Secondary | ICD-10-CM

## 2022-08-31 DIAGNOSIS — F17200 Nicotine dependence, unspecified, uncomplicated: Secondary | ICD-10-CM

## 2022-08-31 MED ORDER — NITROGLYCERIN 0.4 MG SL SUBL: 0.4000 mg | SUBLINGUAL_TABLET | SUBLINGUAL | 11 refills | Status: AC | PRN

## 2022-08-31 MED ORDER — EZETIMIBE 10 MG PO TABS: 10.0000 mg | ORAL_TABLET | Freq: Every day | ORAL | 3 refills | Status: DC

## 2022-08-31 MED ORDER — ROSUVASTATIN CALCIUM 40 MG PO TABS: 40.0000 mg | ORAL_TABLET | Freq: Every day | ORAL | 3 refills | Status: AC

## 2022-08-31 NOTE — Patient Instructions (Addendum)
  Medication Instructions:  Your physician has recommended you make the following change in your medication:   Start Zetia 10 mg daily.  Continue Rosuvastatin  Use nitroglycerin 1 tablet placed under the tongue at the first sign of chest pain or an angina attack. 1 tablet may be used every 5 minutes as needed, for up to 15 minutes. Do not take more than 3 tablets in 15 minutes. If pain persist call 911 or go to the nearest ED.   *If you need a refill on your cardiac medications before your next appointment, please call your pharmacy*   Lab Work: None ordered If you have labs (blood work) drawn today and your tests are completely normal, you will receive your results only by: [place] (if you have MyChart) OR A paper copy in the mail If you have any lab test that is abnormal or we need to change your treatment, we will call you to review the results.   Testing/Procedures: None ordered   Follow-Up: At [facility], you and your health needs are our priority.  As part of our continuing mission to provide you with exceptional heart care, we have created designated Provider Care Teams.  These Care Teams include your primary Cardiologist (physician) and Advanced Practice Providers (APPs -  Physician Assistants and Nurse Practitioners) who all work together to provide you with the care you need, when you need it.  We recommend signing up for the patient portal called "MyChart".  Sign up information is provided on this After Visit Summary.  MyChart is used to connect with patients for Virtual Visits (Telemedicine).  Patients are able to view lab/test results, encounter notes, upcoming appointments, etc.  Non-urgent messages can be sent to your provider as well.   To learn more about what you can do with MyChart, go to [email or web address].    Your next appointment:   12 month(s)  The format for your next appointment:   In Person  Provider:   Shirlee More, MD   Other  Instructions NA

## 2022-08-31 NOTE — Addendum Note (Signed)
Addended by: Darius Bump B on: 08/31/2022 04:47 PM   Modules accepted: Orders

## 2022-08-31 NOTE — Addendum Note (Signed)
Addended by: Truddie Hidden on: 08/31/2022 08:53 AM   Modules accepted: Orders

## 2022-10-24 ENCOUNTER — Other Ambulatory Visit: Payer: Self-pay | Admitting: Cardiology

## 2022-10-24 ENCOUNTER — Other Ambulatory Visit: Payer: Self-pay | Admitting: Hematology and Oncology

## 2022-10-24 DIAGNOSIS — I2510 Atherosclerotic heart disease of native coronary artery without angina pectoris: Secondary | ICD-10-CM

## 2022-10-24 DIAGNOSIS — I6523 Occlusion and stenosis of bilateral carotid arteries: Secondary | ICD-10-CM

## 2022-10-24 DIAGNOSIS — I10 Essential (primary) hypertension: Secondary | ICD-10-CM

## 2022-10-24 DIAGNOSIS — Z170 Estrogen receptor positive status [ER+]: Secondary | ICD-10-CM

## 2022-10-24 DIAGNOSIS — E7849 Other hyperlipidemia: Secondary | ICD-10-CM

## 2022-10-24 DIAGNOSIS — C50411 Malignant neoplasm of upper-outer quadrant of right female breast: Secondary | ICD-10-CM

## 2022-10-26 NOTE — Telephone Encounter (Signed)
Refill sent to pharmacy.   

## 2022-12-23 NOTE — Progress Notes (Signed)
[facility]  [address] [place],  [place]  [address] [phone]  Clinic Day:  12/25/22   Referring physician: Darrol Jump, NP  CHIEF COMPLAINT:  CC:  Bilateral stage IA hormone receptor positive breast cancers  Current Treatment:   Anastrozole 1 mg daily   HISTORY OF PRESENT ILLNESS:  Madison Medina is a 62 y.o. 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  We began seeing her in February.  Her disease was discovered on mammography.  There was an 8 mm mass within the right breast,which appeared suspicious, and a 6 mm indeterminate mass within the left breast, as well as an area of distortion within the left breast. 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  The right breast lesion had positive estrogen and progesterone receptors, negative HER 2 and a Ki 67 of 10%. The left breast had positive estrogen and progesterone receptors, negative HER 2 and Ki 67 of 5%.  She developed a stroke after her biopsy and was transferred to [facility], where she had a carotid endarterectomy.  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  She follows with a vascular surgeon. She finally had bilateral lumpectomies performed in March.  Pathology of the right breast revealed a 12 mm, grade 2, invasive ductal carcinoma with negative sentinel node.  Pathology of the left breast revealed a 12 mm, grade 2, invasive ductal carcinoma with negative sentinel node.  Margins were clear.  Adjuvant chemotherapy was not recommended, however, she did receive adjuvant radiation to the bilateral breasts, completed in August.    Due to her personal and family history of malignancy, she underwent testing for hereditary cancer syndromes with the [employer] myRisk hereditary cancer panel test in June.  This did not reveal any clinically significant mutation or variants of  uncertain significance.  Due to her history of stroke, we felt tamoxifen would be contraindicated due to the increased risk of thrombosis seen with it.  She had a bone density scan in September 2020, which revealed normal bone density. She was placed on anastrozole 1 mg daily in December 2020.  She had a palpable a mass in the upper outer quadrant of her right breast in late 2020, and spot tangential view of this region shows a circumscribed lucent mass measuring 1.8 cm which was consistent with benign fat necrosis.  No evidence of malignancy was identified.  She has continued to smoke 1 and half packs of cigarettes daily.  She has smoked since age 22 and at least 1 pack per day for 30 years.  We have given her information on the [facility].  She has COPD and sleep apnea, and uses a C-PAP mask.  Low dose CT for lung cancer screening from September 2021 revealed Lung-RADS 2, benign appearance or behavior.  CT chest in January 2022 did not reveal any evidence of pulmonary embolism or other acute abnormality.  There was a 4 mm subpleural nodule in the left lateral lung base , which was unchanged from previous CT screening. She will not need repeat CT lung cancer screening until January 2023.  INTERVAL HISTORY:  Madison Medina is here for routine follow up for bilateral stage IA hormone receptor positive breast cancer. Bone density from October 2022 revealed osteopenia of the left hip with a T-score of -1.2, previously -0.7. Dual femur total mean is -0.1, previously 0.1.  AP spine and left forearm are both normal with a T-score of 0.1. She is currently taking oral calcium and vitamin D daily. She continues anastrozole daily without significant difficulty. She states that she is feeling well and continues to smoke.  It has been 1 year since her last screening CT of the lung, and that revealed a 8m nodule. Her upcoming mammogram has been postponed due to both breasts swelling. Her Dr. MLilia Prosuggested it can be  from an infection so she was prescribed antibiotics and completed taking them, with some improvement. She still has bilateral erythema of the central breast with post radiation changes and some swelling. I recommended she postpone her mammogram longer, until completely healed. She will receive a low dose lung cancer screening CT this month and I will see her back in 6 months.  We will switch to seeing her once a year.  She denies signs of infection such as sore throat, sinus drainage, cough, or urinary symptoms.  She denies fevers or recurrent chills. She denies pain. She denies nausea, vomiting, chest pain, dyspnea or cough. Her weight has been stable.   REVIEW OF SYSTEMS:  Review of Systems  Constitutional: Negative.  Negative for appetite change, chills, diaphoresis, fatigue, fever and unexpected weight change.  HENT:  Negative.  Negative for hearing loss, lump/mass, mouth sores, nosebleeds, sore throat, tinnitus, trouble swallowing and voice change.   Eyes: Negative.  Negative for eye problems and icterus.  Respiratory: Negative.  Negative for chest tightness, cough, hemoptysis, shortness of breath and wheezing.   Cardiovascular: Negative.  Negative for chest pain, leg swelling and palpitations.  Gastrointestinal: Negative.  Negative for abdominal distention, abdominal pain, blood in stool, constipation, diarrhea, nausea, rectal pain and vomiting.  Endocrine: Negative.  Negative for hot flashes.  Genitourinary: Negative.  Negative for bladder incontinence, difficulty urinating, dyspareunia, dysuria, frequency, hematuria, menstrual problem, nocturia, pelvic pain, vaginal bleeding and vaginal discharge.   Musculoskeletal: Negative.  Negative for arthralgias, back pain (lower back), flank pain, gait problem, myalgias, neck pain and neck stiffness.  Skin: Negative.  Negative for itching, rash and wound.       She has erythema and swelling of both breasts which has been treated with antibiotics and is  improved.  Neurological: Negative.  Negative for dizziness, extremity weakness, gait problem, headaches, light-headedness, numbness, seizures and speech difficulty.  Hematological: Negative.  Negative for adenopathy. Does not bruise/bleed easily.  Psychiatric/Behavioral: Negative.  Negative for confusion, decreased concentration, depression, sleep disturbance and suicidal ideas. The patient is not nervous/anxious.   All other systems reviewed and are negative.   VITALS:  Blood pressure (!) 177/81, pulse 75, temperature 97.9 F (36.6 C), temperature source Oral, resp. rate 19, height '5\' 4"'$  (1.626 m), weight 281 lb 3.2 oz (127.6 kg), SpO2 96 %.  Wt Readings from Last 3 Encounters:  12/25/22 281 lb 3.2 oz (127.6 kg)  08/31/22 282 lb (127.9 kg)  06/24/22 277 lb 6.4 oz (125.8 kg)    Body mass index is 48.27 kg/m.  Performance status (ECOG): 1 - Symptomatic but completely ambulatory  PHYSICAL EXAM:  Physical Exam Vitals and nursing note reviewed.  Constitutional:      General: She is not in acute distress.    Appearance: Normal appearance. She is normal weight. She is not ill-appearing, toxic-appearing or diaphoretic.  HENT:     Head: Normocephalic and atraumatic.     Right Ear: Tympanic membrane, ear canal and external ear normal. There is no impacted cerumen.  Left Ear: Tympanic membrane, ear canal and external ear normal. There is no impacted cerumen.     Nose: Nose normal. No congestion or rhinorrhea.     Mouth/Throat:     Mouth: Mucous membranes are moist.     Pharynx: Oropharynx is clear. No oropharyngeal exudate or posterior oropharyngeal erythema.  Eyes:     General: No scleral icterus.       Right eye: No discharge.        Left eye: No discharge.     Extraocular Movements: Extraocular movements intact.     Conjunctiva/sclera: Conjunctivae normal.     Pupils: Pupils are equal, round, and reactive to light.  Neck:     Vascular: No carotid bruit.     Comments: Well healed  scar from left endarterectomy Cardiovascular:     Rate and Rhythm: Normal rate and regular rhythm.     Pulses: Normal pulses.     Heart sounds: Normal heart sounds. No murmur heard.    No friction rub. No gallop.  Pulmonary:     Effort: Pulmonary effort is normal. No respiratory distress.     Breath sounds: Normal breath sounds. No stridor. No wheezing, rhonchi or rales.  Chest:     Chest wall: No tenderness.  Breasts:    Right: Normal.     Comments: No masses in either breast. Bilateral erythema of the central breast with post radiation changes, non tender. Scar on the superior areolar complex on the right and on the left. Abdominal:     General: Bowel sounds are normal. There is no distension.     Palpations: Abdomen is soft. There is no hepatomegaly, splenomegaly or mass.     Tenderness: There is no abdominal tenderness. There is no right CVA tenderness, left CVA tenderness, guarding or rebound.     Hernia: No hernia is present.  Musculoskeletal:        General: No swelling, tenderness, deformity or signs of injury. Normal range of motion.     Cervical back: Normal range of motion and neck supple. No rigidity or tenderness.     Right lower leg: No edema.     Left lower leg: No edema.  Lymphadenopathy:     Cervical: No cervical adenopathy.  Skin:    General: Skin is warm and dry.     Coloration: Skin is not jaundiced or pale.     Findings: Erythema (of both breasts) present. No bruising, lesion or rash.  Neurological:     General: No focal deficit present.     Mental Status: She is alert and oriented to person, place, and time. Mental status is at baseline.     Cranial Nerves: No cranial nerve deficit.     Sensory: No sensory deficit.     Motor: No weakness.     Coordination: Coordination normal.     Gait: Gait normal.     Deep Tendon Reflexes: Reflexes normal.  Psychiatric:        Mood and Affect: Mood normal.        Behavior: Behavior normal.        Thought Content:  Thought content normal.        Judgment: Judgment normal.    LABS:      Latest Ref Rng & Units 06/24/2022   12:00 AM 07/24/2021   12:00 AM 11/22/2020   12:00 AM  CBC  WBC  8.8     7.9  8.4      Hemoglobin 12.0 -  16.0 13.2     14.4  13.0      Hematocrit 36 - 46 41     44  39      Platelets 150 - 400 K/uL 295     311  340         This result is from an external source.      Latest Ref Rng & Units 06/24/2022   12:00 AM 07/24/2021   12:00 AM 11/22/2020   12:00 AM  CMP  BUN 4 - '21 22     25  28      '$ Creatinine 0.5 - 1.1 0.9     1.0  0.8      Sodium 137 - 147 136     138  136      Potassium 3.5 - 5.1 mEq/L 4.7     4.6  4.5      Chloride 99 - 108 100     100  105      CO2 13 - '22 30     29  24      '$ Calcium 8.7 - 10.7 9.4     8.9  9.3      Alkaline Phos 25 - 125 73     72  86      AST 13 - 35 68     65  53      ALT 7 - 35 U/L 52     45  45         This result is from an external source.    Ref Range & Units  09/14/2022  TSH 0.45 - 5.00 UIU/ML 6.89 High      Ref Range & Units   09/14/2022  FREE T3 2.30 - 4.20 pg/mL 2.75    Ref Range & Units  09/14/2022  FREE T4 0.6 - 1.3 NG/DL 2.8 High     STUDIES:    EXAM: 09/17/2021 DUAL X-RAY ABSORPTIOMETRY (DXA) FOR BONE MINERAL DENSITY  IMPRESSION:  Emillee Haworth completed a BMD test on 09/17/2021 using the [employer] (analysis version: 13.60) manufactured by [employer].  The following summarizes the results of our evaluation.  Technologist: APU  PATIENT BIOGRAPHICAL:  Name: ONA, ROEHRS  Patient ID: [id number] [facility] Birth Date: 1961/06/23 Height: 64.0 in.  Gender: Female Exam Date: 09/17/2021 Weight: 290.0 lbs.  Indications: Tobacco User (Current Smoker) Fractures: Treatments:   ASSESSMENT:  The BMD measured at Femur Neck Left is 0.867 g/cm2 with a T-score of  -1.2. This patient is considered osteopenic according to [facility]) criteria.  The scan quality is good.  Site Region Measured  Measured WHO Young Adult BMD  Date Age Classification T-score   AP Spine L1-L4 09/17/2020 61.0 Normal 0.1 1.197 g/cm2  AP Spine L1-L4 09/11/2019 59.0 Normal 0.0 1.177 g/cm2   DualFemur Neck Left 09/17/2021 61.0 Osteopenia -1.2 0.867 g/cm2  DualFemur Neck Left 09/11/2019 59.0 Normal -0.7 0.940 g/cm2   DualFemur Total Mean 09/17/2021 61.0 Normal -0.1 0.991 g/cm2  DualFemur Total Mean 09/11/2019 59.0 Normal 0.1 1.020 g/cm2   Left Forearm Radius 33% 09/17/2021 61.0 Normal 0.1 0.886 g/cm2   EXAM: 12/19/2021 CT CHEST WITHOUT CONTRAST  TECHNIQUE:  Multidetector CT imaging of the chest was performed following the  standard protocol without IV contrast.  COMPARISON: 12/17/2020  FINDINGS:  Cardiovascular: Unenhanced imaging of the heart and great vessels  demonstrates no pericardial effusion. Normal caliber of the thoracic  aorta. Stable atherosclerosis  of the aorta and coronary vasculature.  Mediastinum/Nodes: No enlarged mediastinal or axillary lymph nodes.  Thyroid gland, trachea, and esophagus demonstrate no significant  findings.  Lungs/Pleura: No acute airspace disease, effusion, or pneumothorax.  Minimal subpleural scarring or atelectasis within the right upper  lobe, abutting the minor fissure and mediastinal contours. Stable 4  mm mean diameter left lower lobe pulmonary nodule, reference image  105/4. Given the long-term stability this can be considered benign.  No specific imaging follow-up is required. Central airways are  patent.  Upper Abdomen: Nonspecific subcentimeter lymph nodes are seen at the  porta hepatis. No acute upper abdominal findings.  Musculoskeletal: There are no acute or destructive bony lesions.  Reconstructed images demonstrate no additional findings.   IMPRESSION:  1. Stable benign 4 mm subpleural left lower lobe pulmonary nodule.  No specific imaging follow-up is required.  2. No acute intrathoracic process.  3. Aortic Atherosclerosis (ICD10-I70.0). Coronary  artery  atherosclerosis.   HISTORY:   Allergies:  Allergies  Allergen Reactions   Repatha [Evolocumab] Other (See Comments)    Muscle Aches    Current Medications: Current Outpatient Medications  Medication Sig Dispense Refill   albuterol (PROVENTIL) (2.5 MG/3ML) 0.083% nebulizer solution Use three times daily with nebulizer     albuterol (VENTOLIN HFA) 108 (90 Base) MCG/ACT inhaler SMARTSIG:2 Puff(s) By Mouth Every 4-6 Hours PRN     amLODipine (NORVASC) 5 MG tablet Take 5 mg by mouth daily.      anastrozole (ARIMIDEX) 1 MG tablet TAKE ONE TABLET BY MOUTH EVERY DAY 90 tablet 3   aspirin EC 81 MG tablet Take 1 tablet (81 mg total) by mouth daily. 90 tablet 3   benzonatate (TESSALON) 100 MG capsule Take 100 mg by mouth 3 (three) times daily as needed for cough.     busPIRone (BUSPAR) 5 MG tablet SMARTSIG:1 Tablet(s) By Mouth Twice Daily     celecoxib (CELEBREX) 200 MG capsule Take 200 mg by mouth 2 (two) times daily.     clobetasol cream (TEMOVATE) 2.44 % Apply 1 application topically 2 (two) times daily between meals as needed.     cloNIDine (CATAPRES) 0.1 MG tablet Take 1 tablet daily if your systolic BP (top number) is greater than 180. 30 tablet 3   ezetimibe (ZETIA) 10 MG tablet Take 1 tablet (10 mg total) by mouth daily. 90 tablet 3   famotidine (PEPCID) 40 MG tablet Take 40 mg by mouth at bedtime.     isosorbide mononitrate (IMDUR) 30 MG 24 hr tablet Take 1 tablet (30 mg total) by mouth daily. 90 tablet 3   levothyroxine (SYNTHROID) 300 MCG tablet Take 300 mcg by mouth daily before breakfast.     lisinopril (PRINIVIL,ZESTRIL) 20 MG tablet Take 20 mg by mouth 2 (two) times daily.      meloxicam (MOBIC) 7.5 MG tablet Take 7.5 mg by mouth daily.     nitroGLYCERIN (NITROSTAT) 0.4 MG SL tablet Place 1 tablet (0.4 mg total) under the tongue every 5 (five) minutes as needed for chest pain. 25 tablet 11   nystatin (MYCOSTATIN/NYSTOP) powder Apply 1 Application topically 3 (three) times  daily. 15 g 0   omeprazole (PRILOSEC) 40 MG capsule Take 40 mg by mouth daily.     oxybutynin (DITROPAN-XL) 10 MG 24 hr tablet Take 10 mg by mouth daily.     oxyCODONE-acetaminophen (PERCOCET) 7.5-325 MG tablet Take 1 tablet by mouth 3 (three) times daily.      rosuvastatin (CRESTOR) 40 MG  tablet Take 1 tablet (40 mg total) by mouth daily. 90 tablet 3   SPIRIVA RESPIMAT 1.25 MCG/ACT AERS SMARTSIG:2 Inhalation By Mouth Daily     SYMBICORT 160-4.5 MCG/ACT inhaler Inhale into the lungs.     traZODone (DESYREL) 150 MG tablet Take 150 mg by mouth at bedtime.     triamcinolone cream (KENALOG) 0.5 % Apply topically as needed (itching).     Vitamin D, Cholecalciferol, 10 MCG (400 UNIT) TABS Take 2 tablets by mouth daily.     No current facility-administered medications for this visit.     ASSESSMENT & PLAN:   Assessment:  1. 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  She remains without evidence of recurrence.  She knows to continue anastrozole 1 mg daily for least a total of 5 years, until December 2025.     2. Tobacco abuse. We have discussed smoking cessation on multiple occasions.  Due to her 30+ pack-year history of smoking, she is undergoing annual low-dose CT chest screening for lung cancer. She will need an annual low-dose CT chest screening in January 2024.   3.  Severe carotid arteriosclerosis requiring endarterectomy on the left.  4.  Osteopenia, minimal. She continues oral calcium and vitamin D daily.  Plan:   She knows to continue anastrozole 1 mg daily.  We will schedule low dose lung cancer screening CT this month. She will be due for her next bone density scan after October, 2024. We will plan to see her back in 6 months for repeat evaluation. If all is well, we can go to yearly follow-up after that since Dr.Morgan checks her annually in the winter.   The patient understands the plans discussed today and is in agreement with them.  She knows to contact our office if she develops concerns prior to her next appointment.  I provided 15 minutes of face-to-face time during this this encounter and > 50% was spent counseling as documented under my assessment and plan.    I,Jasmine M Lassiter,acting as a scribe for Derwood Kaplan, MD.,have documented all relevant documentation on the behalf of Derwood Kaplan, MD,as directed by  Derwood Kaplan, MD while in the presence of Derwood Kaplan, MD.

## 2022-12-25 ENCOUNTER — Encounter: Payer: Self-pay | Admitting: Oncology

## 2022-12-25 ENCOUNTER — Inpatient Hospital Stay: Payer: Medicare Other | Attending: Oncology | Admitting: Oncology

## 2022-12-25 ENCOUNTER — Telehealth: Payer: Self-pay | Admitting: Oncology

## 2022-12-25 ENCOUNTER — Other Ambulatory Visit: Payer: Self-pay | Admitting: Oncology

## 2022-12-25 VITALS — BP 177/81 | HR 75 | Temp 97.9°F | Resp 19 | Ht 64.0 in | Wt 281.2 lb

## 2022-12-25 DIAGNOSIS — C50412 Malignant neoplasm of upper-outer quadrant of left female breast: Secondary | ICD-10-CM | POA: Diagnosis not present

## 2022-12-25 DIAGNOSIS — Z170 Estrogen receptor positive status [ER+]: Secondary | ICD-10-CM | POA: Diagnosis not present

## 2022-12-25 DIAGNOSIS — Z720 Tobacco use: Secondary | ICD-10-CM

## 2022-12-25 NOTE — Telephone Encounter (Signed)
Patient has been scheduled for follow-up visit per 12/25/22 LOS.  Pt given an appt calendar with date and time.

## 2022-12-31 ENCOUNTER — Other Ambulatory Visit (HOSPITAL_COMMUNITY): Payer: Self-pay

## 2023-01-19 ENCOUNTER — Telehealth: Payer: Self-pay

## 2023-01-19 NOTE — Telephone Encounter (Signed)
Patient notified of results.

## 2023-01-19 NOTE — Telephone Encounter (Signed)
-----   Message from Derwood Kaplan, MD sent at 01/18/2023  6:08 PM EST ----- Regarding: call Tell her CT chest looks good, mammo clear.  Will plan to repeat both in 1 year

## 2023-06-24 NOTE — Progress Notes (Deleted)
[facility]  [address] [place],  [place]  [address] [phone]  Clinic Day:  06/24/2023  Referring physician: Grayland Jack, *   CHIEF COMPLAINT:  CC: History of stage IA hormone receptor positive breast cancer  Current Treatment:  ***  HISTORY OF PRESENT ILLNESS:  Madison Medina is a 62 y.o. 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  We began seeing her in February.  Her disease was discovered on mammography.  There was an 8 mm mass within the right breast,which appeared suspicious, and a 6 mm indeterminate mass within the left breast, as well as an area of distortion within the left breast. 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  The right breast lesion had positive estrogen and progesterone receptors, negative HER 2 and a Ki 67 of 10%. The left breast had positive estrogen and progesterone receptors, negative HER 2 and Ki 67 of 5%.  She developed a stroke after her biopsy and was transferred to [facility], where she had a carotid endarterectomy.  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  She follows with a vascular surgeon. She finally had bilateral lumpectomies performed in March.  Pathology of the right breast revealed a 12 mm, grade 2, invasive ductal carcinoma with negative sentinel node.  Pathology of the left breast revealed a 12 mm, grade 2, invasive ductal carcinoma with negative sentinel node.  Margins were clear.  Adjuvant chemotherapy was not recommended, however, she did receive adjuvant radiation to the bilateral breasts, completed in August.     Due to her personal and family history of malignancy, she underwent testing for hereditary cancer syndromes with the My[employer] myRisk hereditary cancer panel test in June.  This did not reveal any clinically significant mutation or variants of  uncertain significance.  Due to her history of stroke, we felt tamoxifen would be contraindicated due to the increased risk of thrombosis seen with it.  She had a bone density scan in September 2020, which revealed normal bone density. She was placed on anastrozole 1 mg daily in December 2020.  She had a palpable a mass in the upper outer quadrant of her right breast in late 2020, and spot tangential view of this region shows a circumscribed lucent mass measuring 1.8 cm which was consistent with benign fat necrosis.  No evidence of malignancy was identified.  She has continued to smoke cigarettes daily.  She has smoked since age 62 and at least 1 pack per day for 30 years.  We have given her information on the [place].  She has COPD and sleep apnea, and uses a C-PAP mask.  Low dose CT for lung cancer screening from September 2021 revealed Lung-RADS 2, benign appearance or behavior.  CT chest in January 2022 did not reveal any evidence of pulmonary embolism or other acute abnormality.  There was a 4 mm subpleural nodule in the left lateral lung base , which was unchanged from previous CT screening.  Bone density from October 2022 revealed osteopenia of the left hip with a T-score of -1.2, previously -0.7. Dual femur total mean is -0.1, previously 0.1. AP spine and left forearm are both normal with a T-score of 0.1. ***Calcium and vitamin D  Low-dose CT chest for lung cancer screening in January 2023 revealed a stable benign 4 mm subpleural left  lower lobe pulmonary nodule with no new changes.  Dr. Rachael Darby ordered low-dose CT chest in June 2023 which revealed a new irregular 7.2 mm nodule in the left upper lobe, as well as a new 7.5 mm subpleural nodule in the right medial lobe felt to be potentially subpleural atelectasis.  She had repeat low-dose CT chest in September 2023 revealed subpleural nodule within the posterior medial aspect of the right of 5.6 mm. No new or suspicious lung nodules  identified at this time. Within the anterior left upper lobe and medial right middle lobe previous nodular densities measure fat attenuation on today's study and likely represent areas of irregularity of pleural fat, which appear unchanged in the interval.     Oncology History  Breast cancer in female [facility])  12/28/2018 Initial Diagnosis   Breast cancer in female [facility])   01/02/2019 Cancer Staging   Staging form: Breast, AJCC 8th Edition - Clinical stage from 01/02/2019: Stage IA (cT1c, cN0(sn), cM0, G2, ER+, PR+, HER2-) - Signed by Dellia Beckwith, MD on 11/22/2020   Breast cancer of upper-outer quadrant of left female breast (HCC)  12/28/2018 Initial Diagnosis   Breast cancer of upper-outer quadrant of left female breast (HCC)   01/02/2019 Cancer Staging   Staging form: Breast, AJCC 8th Edition - Clinical stage from 01/02/2019: Stage IA (cT1b, cN0, cM0, G2, ER+, PR+, HER2-) - Signed by Dellia Beckwith, MD on 11/22/2020       INTERVAL HISTORY:  Madison Medina is here today for repeat clinical assessment.  She states she continues anastrozole 1 mg daily without significant difficulty.  She denies any changes in her breasts.  She denies fevers or chills. She denies pain. Her appetite is good. Her weight {Weight change:10426}.  She continues to follow with Dr. Lequita Halt.  Bilateral diagnostic mammogram in January did not reveal any evidence of malignancy.  Stable bilateral postlumpectomy changes were seen.  She also had a low-dose CT chest in January was stable.  REVIEW OF SYSTEMS:  Review of Systems - Oncology   VITALS:  There were no vitals taken for this visit.  Wt Readings from Last 3 Encounters:  12/25/22 281 lb 3.2 oz (127.6 kg)  08/31/22 282 lb (127.9 kg)  06/24/22 277 lb 6.4 oz (125.8 kg)    There is no height or weight on file to calculate BMI.  Performance status (ECOG): {CHL ONC [id number]  PHYSICAL EXAM:  Physical Exam  LABS:      Latest Ref Rng & Units 06/24/2022    12:00 AM 07/24/2021   12:00 AM 11/22/2020   12:00 AM  CBC  WBC  8.8     7.9  8.4      Hemoglobin 12.0 - 16.0 13.2     14.4  13.0      Hematocrit 36 - 46 41     44  39      Platelets 150 - 400 K/uL 295     311  340         This result is from an external source.      Latest Ref Rng & Units 06/24/2022   12:00 AM 07/24/2021   12:00 AM 11/22/2020   12:00 AM  CMP  BUN 4 - 21 22     25  28       Creatinine 0.5 - 1.1 0.9     1.0  0.8      Sodium 137 - 147 136     138  136  Potassium 3.5 - 5.1 mEq/L 4.7     4.6  4.5      Chloride 99 - 108 100     100  105      CO2 13 - 22 30     29  24       Calcium 8.7 - 10.7 9.4     8.9  9.3      Alkaline Phos 25 - 125 73     72  86      AST 13 - 35 68     65  53      ALT 7 - 35 U/L 52     45  45         This result is from an external source.     No results found for: "CEA1", "CEA" / No results found for: "CEA1", "CEA" No results found for: "PSA1" No results found for: "YQM578" No results found for: "CAN125"  No results found for: "TOTALPROTELP", "ALBUMINELP", "A1GS", "A2GS", "BETS", "BETA2SER", "GAMS", "MSPIKE", "SPEI" No results found for: "TIBC", "FERRITIN", "IRONPCTSAT" No results found for: "LDH"  STUDIES:  No results found.    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   HTN (hypertension) 12/30/2018   Hyperlipidemia    Hypertension    Hyponatremia 12/30/2018   Hypothyroid 02/03/2016   Hypothyroidism    Nonobstructive atherosclerosis of coronary artery 08/03/2019   Stroke (HCC)    Tobacco use 07/09/2016    Past Surgical History:  Procedure Laterality Date   BREAST BIOPSY  12/2018   at breast center in  [place]   CARDIAC CATHETERIZATION     CAROTID ENDARTERECTOMY     CARPAL TUNNEL RELEASE     CYST EXCISION     ENDARTERECTOMY Left 01/04/2019   Procedure: ENDARTERECTOMY CAROTID LEFT;  Surgeon: Nada Libman, MD;  Location: [facility] OR;  Service: Vascular;  Laterality: Left;   LEFT HEART CATH AND CORONARY ANGIOGRAPHY N/A 01/03/2019   Procedure: LEFT HEART CATH AND CORONARY ANGIOGRAPHY;  Surgeon: Lyn Records, MD;  Location: [facility] INVASIVE CV LAB;  Service: Cardiovascular;  Laterality: N/A;   PATCH ANGIOPLASTY Left 01/04/2019   Procedure: PATCH ANGIOPLASTY USING Livia Snellen BIOLOGIC PATCH;  Surgeon: Nada Libman, MD;  Location: [facility] OR;  Service: Vascular;  Laterality: Left;    Family History  Problem Relation Age of Onset   High blood pressure Other    Diabetes Other    Cancer Other    Stroke Other    Heart disease Other    Heart disease Mother    Diabetes Mother    Heart disease Father    Diabetes Father    Heart disease Sister    Heart disease Brother    Diabetes Brother     Social History:  reports that she has been smoking cigarettes. She has a 80 pack-year smoking history. She has never used smokeless tobacco. She reports that she does not currently use alcohol. She reports that she does not use drugs.The patient is {Blank single:19197::"alone","accompanied by"} *** today.  Allergies:  Allergies  Allergen Reactions   Repatha [Evolocumab] Other (See Comments)    Muscle Aches  Current Medications: Current Outpatient Medications  Medication Sig Dispense Refill   albuterol (PROVENTIL) (2.5 MG/3ML) 0.083% nebulizer solution Use three times daily with nebulizer     albuterol (VENTOLIN HFA) 108 (90 Base) MCG/ACT inhaler SMARTSIG:2 Puff(s) By Mouth Every 4-6 Hours PRN     amLODipine (NORVASC) 5 MG tablet Take 5 mg by mouth daily.      anastrozole (ARIMIDEX) 1 MG tablet TAKE ONE TABLET BY MOUTH EVERY DAY 90 tablet 3   aspirin EC 81 MG tablet Take 1 tablet (81 mg total) by mouth  daily. 90 tablet 3   benzonatate (TESSALON) 100 MG capsule Take 100 mg by mouth 3 (three) times daily as needed for cough.     busPIRone (BUSPAR) 5 MG tablet SMARTSIG:1 Tablet(s) By Mouth Twice Daily     celecoxib (CELEBREX) 200 MG capsule Take 200 mg by mouth 2 (two) times daily.     clobetasol cream (TEMOVATE) 0.05 % Apply 1 application topically 2 (two) times daily between meals as needed.     cloNIDine (CATAPRES) 0.1 MG tablet Take 1 tablet daily if your systolic BP (top number) is greater than 180. 30 tablet 3   ezetimibe (ZETIA) 10 MG tablet Take 1 tablet (10 mg total) by mouth daily. 90 tablet 3   famotidine (PEPCID) 40 MG tablet Take 40 mg by mouth at bedtime.     isosorbide mononitrate (IMDUR) 30 MG 24 hr tablet Take 1 tablet (30 mg total) by mouth daily. 90 tablet 3   levothyroxine (SYNTHROID) 300 MCG tablet Take 300 mcg by mouth daily before breakfast.     lisinopril (PRINIVIL,ZESTRIL) 20 MG tablet Take 20 mg by mouth 2 (two) times daily.      meloxicam (MOBIC) 7.5 MG tablet Take 7.5 mg by mouth daily.     nitroGLYCERIN (NITROSTAT) 0.4 MG SL tablet Place 1 tablet (0.4 mg total) under the tongue every 5 (five) minutes as needed for chest pain. 25 tablet 11   nystatin (MYCOSTATIN/NYSTOP) powder Apply 1 Application topically 3 (three) times daily. 15 g 0   omeprazole (PRILOSEC) 40 MG capsule Take 40 mg by mouth daily.     oxybutynin (DITROPAN-XL) 10 MG 24 hr tablet Take 10 mg by mouth daily.     oxyCODONE-acetaminophen (PERCOCET) 7.5-325 MG tablet Take 1 tablet by mouth 3 (three) times daily.      rosuvastatin (CRESTOR) 40 MG tablet Take 1 tablet (40 mg total) by mouth daily. 90 tablet 3   SPIRIVA RESPIMAT 1.25 MCG/ACT AERS SMARTSIG:2 Inhalation By Mouth Daily     SYMBICORT 160-4.5 MCG/ACT inhaler Inhale into the lungs.     traZODone (DESYREL) 150 MG tablet Take 150 mg by mouth at bedtime.     triamcinolone cream (KENALOG) 0.5 % Apply topically as needed (itching).     Vitamin D,  Cholecalciferol, 10 MCG (400 UNIT) TABS Take 2 tablets by mouth daily.     No current facility-administered medications for this visit.     ASSESSMENT & PLAN:   Assessment & Plan: Madison Medina is a 62 y.o. female with stage IA, receptor positive left breast cancer.  She remains without evidence of recurrence.  She knows to continue anastrozole 1 mg daily for at least a total of 5 years.  ***I recommended that she take calcium/vitamin D daily, especially giving her osteopenia pia..  The patient understands the plans discussed today and is in agreement with them.  She knows to contact our office if she develops concerns prior to  her next appointment.     I provided *** minutes of face-to-face time during this encounter and > 50% was spent counseling as documented under my assessment and plan.    Adah Perl, PA-C  [facility] AT [facility] [address] [place] [address] Dept: [phone] Dept Fax: [phone]   No orders of the defined types were placed in this encounter.

## 2023-06-25 ENCOUNTER — Inpatient Hospital Stay: Payer: Medicare HMO | Attending: Hematology and Oncology | Admitting: Hematology and Oncology

## 2023-10-17 ENCOUNTER — Other Ambulatory Visit: Payer: Self-pay | Admitting: Cardiology

## 2023-10-17 ENCOUNTER — Other Ambulatory Visit: Payer: Self-pay | Admitting: Oncology

## 2023-10-17 DIAGNOSIS — Z170 Estrogen receptor positive status [ER+]: Secondary | ICD-10-CM

## 2023-10-17 DIAGNOSIS — C50411 Malignant neoplasm of upper-outer quadrant of right female breast: Secondary | ICD-10-CM

## 2023-10-17 DIAGNOSIS — E7849 Other hyperlipidemia: Secondary | ICD-10-CM

## 2023-10-17 DIAGNOSIS — I10 Essential (primary) hypertension: Secondary | ICD-10-CM

## 2023-10-17 DIAGNOSIS — I6523 Occlusion and stenosis of bilateral carotid arteries: Secondary | ICD-10-CM

## 2023-10-17 DIAGNOSIS — I2510 Atherosclerotic heart disease of native coronary artery without angina pectoris: Secondary | ICD-10-CM

## 2023-11-30 ENCOUNTER — Other Ambulatory Visit: Payer: Self-pay | Admitting: Cardiology

## 2023-11-30 DIAGNOSIS — I6523 Occlusion and stenosis of bilateral carotid arteries: Secondary | ICD-10-CM

## 2023-11-30 DIAGNOSIS — I10 Essential (primary) hypertension: Secondary | ICD-10-CM

## 2023-11-30 DIAGNOSIS — I2510 Atherosclerotic heart disease of native coronary artery without angina pectoris: Secondary | ICD-10-CM

## 2023-11-30 DIAGNOSIS — E7849 Other hyperlipidemia: Secondary | ICD-10-CM

## 2023-12-29 ENCOUNTER — Other Ambulatory Visit: Payer: Self-pay | Admitting: Cardiology

## 2023-12-29 DIAGNOSIS — I10 Essential (primary) hypertension: Secondary | ICD-10-CM

## 2023-12-29 DIAGNOSIS — I6523 Occlusion and stenosis of bilateral carotid arteries: Secondary | ICD-10-CM

## 2023-12-29 DIAGNOSIS — I2510 Atherosclerotic heart disease of native coronary artery without angina pectoris: Secondary | ICD-10-CM

## 2023-12-29 DIAGNOSIS — E7849 Other hyperlipidemia: Secondary | ICD-10-CM

## 2024-01-10 LAB — HM MAMMOGRAPHY

## 2024-01-19 NOTE — Therapy (Signed)
 OUTPATIENT PHYSICAL THERAPY  UPPER EXTREMITY ONCOLOGY EVALUATION  Patient Name: Madison Medina MRN: [id number] DOB:02-02-1961, 63 y.o., female Today's Date: 01/20/2024  END OF SESSION:  PT End of Session - 01/20/24 1153     Visit Number 1    Number of Visits 12    Date for PT Re-Evaluation 04/13/24    Authorization Type [employer]    PT Start Time 1105    PT Stop Time 1155    PT Time Calculation (min) 50 min    Activity Tolerance Patient tolerated treatment well    Behavior During Therapy [facility] for tasks assessed/performed             Past Medical History:  Diagnosis Date   AKI (acute kidney injury) (HCC) 12/30/2018   Anxiety 10/12/2016   Asthma    Back pain    CAD (coronary artery disease) 01/03/2019   Carotid artery occlusion    COPD (chronic obstructive pulmonary disease) (HCC)    CVA (cerebral vascular accident) (HCC) 12/30/2018   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   HTN (hypertension) 12/30/2018   Hyperlipidemia    Hypertension    Hyponatremia 12/30/2018   Hypothyroid 02/03/2016   Hypothyroidism    Nonobstructive atherosclerosis of coronary artery 08/03/2019   Stroke (HCC)    Tobacco use 07/09/2016   Past Surgical History:  Procedure Laterality Date   BREAST BIOPSY  12/2018   at breast center in [place]   CARDIAC CATHETERIZATION     CAROTID ENDARTERECTOMY     CARPAL TUNNEL RELEASE     CYST EXCISION     ENDARTERECTOMY Left 01/04/2019   Procedure: ENDARTERECTOMY CAROTID LEFT;  Surgeon: Serene Gaile ORN, MD;  Location: [facility] OR;  Service: Vascular;  Laterality: Left;   LEFT HEART CATH AND CORONARY ANGIOGRAPHY N/A 01/03/2019   Procedure: LEFT HEART CATH AND CORONARY ANGIOGRAPHY;  Surgeon: Claudene Victory ORN, MD;  [facility] INVASIVE CV LAB;  Service: Cardiovascular;  Laterality: N/A;   PATCH ANGIOPLASTY Left 01/04/2019   Procedure: PATCH ANGIOPLASTY USING GEORGE BIOLOGIC PATCH;  Surgeon: Serene Gaile ORN, MD;  Location[facility] OR;  Service: Vascular;  Laterality:  Left;   Patient Active Problem List   Diagnosis Date Noted   Stroke [facility])    Hypothyroidism    Hyperlipidemia    Carotid artery occlusion    Back pain    Asthma    Bilateral breast cancer (HCC) 11/22/2019   Nonobstructive atherosclerosis of coronary artery 08/03/2019   CAD (coronary artery disease) 01/03/2019   CVA (cerebral vascular accident) (HCC) 12/30/2018   HTN (hypertension) 12/30/2018   COPD (chronic obstructive pulmonary disease) (HCC) 12/30/2018   AKI (acute kidney injury) (HCC) 12/30/2018   Hyponatremia 12/30/2018   Breast cancer in female [facility]) 12/28/2018    Class: History of   Breast cancer of upper-outer quadrant of left female breast (HCC) 12/28/2018    Class: History of   Acute pharyngitis 10/29/2016   Essential hypertension 10/23/2016   Anxiety 10/12/2016   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   Tobacco use 07/09/2016   Sleep apnea 07/09/2016   Hypothyroid 02/03/2016   COPD exacerbation (HCC) 02/03/2016    PCP: Wanda Pouch, FNP  REFERRING PROVIDER: Lynwood Search, MD  REFERRING DIAG: Cellulitis of left breast  THERAPY DIAG:  Cellulitis of breast  Lymphedema, not elsewhere classified  ONSET DATE: Jan 2025  Rationale for Evaluation and Treatment: Rehabilitation  SUBJECTIVE:  SUBJECTIVE STATEMENT: See history Antibiotic was finished and breast redness gone after antibiotics in Nov/Dec but she developed redness and swelling in bilateral breasts after having a mammogram done in January 2025. She still has redness but MD feels she is not infected. Her breasts are tender but not painful. She feels firm areas in her breast. She does not have a compression bra.   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  This was followed by radiation, and Anastozole.Pt had ist episode of bilateral mastitis in 12/2022 which resolved. In November it returned and she was placed on antibiotics and had follow up on 11/26/ and 11/23/23 with mastitis showing improvement. She had improved and was doing well until her mammogram in January and she developed swelling in bilateral breasts the next day. Mammogram  did not show any abnormalities. She was seen on 01/18/2024 with improvement noted but still slightly red  PAIN:  Are you having pain? Yes NPRS scale:0- 4-5/10 at worst/10 Pain location: bilateral breast greatest on the right Pain orientation: Bilateral  PAIN TYPE: sharp Pain description: intermittent  Aggravating factors: nothing just hits Relieving factors: tylenol   PRECAUTIONS: bilateral UE lymphedema risk, COPD, CVA , CAD, hypothyroidism  RED FLAGS: Bowel or bladder incontinence: Yes:      WEIGHT BEARING RESTRICTIONS: No  FALLS:  Has patient fallen in last 6 months? No  LIVING ENVIRONMENT: Lives with: lives with their spouse Lives in: Mobile home Stairs: No;  Has following equipment at home:   OCCUPATION: disabled  LEISURE: read, watch TV, some walking  HAND DOMINANCE: right   PRIOR LEVEL OF FUNCTION: Independent  PATIENT GOALS: Decrease swelling and tenderness   OBJECTIVE: Note: Objective measures were completed at Evaluation unless otherwise noted.  COGNITION: Overall cognitive status: Within functional limits for tasks assessed   PALPATION: Bilateral breasts warm to touch  OBSERVATIONS / OTHER ASSESSMENTS: Bilateral breast with significant redness but was seen by MD 2 days ago. Left breast with very small skin tear underneath with redness and irritation. Fibrosis noted medal breast. Nipple very swollen, and retracted, Right breast also with redness, medial fibrosis, nipple swelling   SENSATION: Light touch: Appears intact    POSTURE: forward head, rounded shoulders  UPPER  EXTREMITY AROM/PROM:  A/PROM RIGHT   eval   Shoulder extension   Shoulder flexion 120  Shoulder abduction 113  Shoulder internal rotation   Shoulder external rotation     (Blank rows = not tested)  A/PROM LEFT   eval  Shoulder extension   Shoulder flexion 125  Shoulder abduction 115  Shoulder internal rotation   Shoulder external rotation     (Blank rows = not tested)  CERVICAL AROM: All within functional limits:      UPPER EXTREMITY STRENGTH:   LYMPHEDEMA ASSESSMENTS:   SURGERY TYPE/DATE: Bilateral lumpectomies 03/09/2019  NUMBER OF LYMPH NODES REMOVED: 0/1 Bilaterally  CHEMOTHERAPY: NO  RADIATION:YES  HORMONE TREATMENT: YES  INFECTIONS: YES   LYMPHEDEMA ASSESSMENTS:   LANDMARK RIGHT  eval  At axilla    15 cm proximal to olecranon process   10 cm proximal to olecranon process   Olecranon process   15 cm proximal to ulnar styloid process   10 cm proximal to ulnar styloid process   Just proximal to ulnar styloid process   Across hand at thumb web space   At base of 2nd digit   (Blank rows = not tested)  LANDMARK LEFT  eval  At axilla  15 cm proximal to olecranon process   10 cm proximal to olecranon process   Olecranon process   15 cm proximal to ulnar styloid process   10 cm proximal to ulnar styloid process   Just proximal to ulnar styloid process   Across hand at thumb web space   At base of 2nd digit   (Blank rows = not tested)   FUNCTIONAL TESTS:    GAIT: Distance walked: front office to room 8  Assistive device utilized: Single point cane Level of assistance: Complete Independence     BREAST COMPLAINTS QUESTIONNAIRE Pain:6 Heaviness:0 Swollen feeling:8 Tense Skin:8 Redness:9 Bra Print:0 Size of Pores:0 Hard feeling: 10 Total:    41 /80 A Score over 9 indicates lymphedema issues in the breast                                                                                                                             TREATMENT DATE:  01/20/2024 Pts bilateral breasts very red,and warm and despite MD seeing her 2 days ago this therapist was concerned she may still have infection. Had Berwyn Knights PTA, and Eward Sharps, PT both see pt and they were in agreement that she should not be treated at present. Messaged MD and let him know we were afraid we would be spreading infection, and asked if he would consider putting pt on Cipro or Keflex. She has Eye Sx for cataracts scheduled on Feb 10, and 25th so she would not likely be able to come for PT until March. Made appt for Re-eval on 02/21/2024. Discussed importance of compression bra when infection clears and also asked MD to provide her with a script. Gave pt pictures of several bras and [employer]. Discussed how compression works and showed pt the wall picture of lymphatics and described that MLD is very gentle and that we will instruct her when it is safe to do so.   PATIENT EDUCATION:  Education details: Concerns of infection being spread if treated now, compression bra when infection cleared, gentle nature of MLD Person educated: Patient Education method: Explanation Education comprehension: verbalized understanding  HOME EXERCISE PROGRAM:   ASSESSMENT:  CLINICAL IMPRESSION: Patient is a 63 y.o. female who was seen today for physical therapy evaluation and treatment for follow up after bilateral breast mastitis. She presents with bilateral breast redness and warmth with continued signs of infection. There is swelling bilaterally, and fibrosis noted bilaterally, especially at the medial breast. Bilateral nipples are also very swollen Two other therapists also assessed and agreed that pt might benefit from another antibiotic. MD was message to see if he is in agreement and willing to try another like Cipro or Keflex. Pt has 2 eye surgeries scheduled and will return to therapy in March. She will also call MD office to be sure it is addressed and  will also request a script for a compression bra to use when redness is resolved. When infection  is resolved she will benefit from skilled PT to address deficits and return pt to prior level.   OBJECTIVE IMPAIRMENTS: decreased knowledge of condition, increased edema, postural dysfunction, and pain.   ACTIVITY LIMITATIONS: sleeping and hygiene/grooming  PARTICIPATION LIMITATIONS:  able to do most but with intermittent pain  PERSONAL FACTORS: 1-2 comorbidities: bilateral breast cancer, s/p radiation, Smoker  are also affecting patient's functional outcome.   REHAB POTENTIAL: Good  CLINICAL DECISION MAKING: Evolving/moderate complexity  EVALUATION COMPLEXITY: Moderate  GOALS: Goals reviewed with patient? Yes  SHORT TERM GOALS: Target date: 04/13/2024  Pt will purchase compression bra to decrease bilateral breast swelling Baseline: Goal status: INITIAL  2.  Pt will have decreased bilateral breast pain by 50% Baseline:  Goal status: INITIAL  3.  Pt will be  independent in MLD to bilateral breasts to decrease swelling Baseline:  Goal status: INITIAL  4.  Breast complaints questionairre will be decreased to no greater than 20 to show improvements in swelling Baseline:  Goal status: INITIAL  :   PLAN:  PT FREQUENCY: 2x/week  PT DURATION: 12 weeks, however pt will not return to PT until March due to several eye surgeries  PLANNED INTERVENTIONS: 97164- PT Re-evaluation, 97110-Therapeutic exercises, 97530- Therapeutic activity, 97535- Self Care, [id number]- Manual therapy, 97760- Orthotic Fit/training, and Patient/Family education  PLAN FOR NEXT SESSION: Check breast redness/swelling fibrosis, Did pt get script for compression bra? Initiate MLD if redness improved, stress importance of compression  Grayce JINNY Sheldon, PT 01/20/2024, 2:49 PM

## 2024-01-20 ENCOUNTER — Ambulatory Visit: Payer: Medicare PPO | Attending: Surgery

## 2024-01-20 ENCOUNTER — Other Ambulatory Visit: Payer: Self-pay

## 2024-01-20 DIAGNOSIS — N61 Mastitis without abscess: Secondary | ICD-10-CM | POA: Diagnosis present

## 2024-01-20 DIAGNOSIS — I890 Lymphedema, not elsewhere classified: Secondary | ICD-10-CM

## 2024-01-20 DIAGNOSIS — Z853 Personal history of malignant neoplasm of breast: Secondary | ICD-10-CM

## 2024-02-04 ENCOUNTER — Encounter: Payer: Self-pay | Admitting: Oncology

## 2024-02-21 ENCOUNTER — Telehealth: Payer: Self-pay

## 2024-02-21 ENCOUNTER — Ambulatory Visit: Payer: Self-pay | Attending: Surgery

## 2024-02-21 DIAGNOSIS — I890 Lymphedema, not elsewhere classified: Secondary | ICD-10-CM | POA: Insufficient documentation

## 2024-02-21 DIAGNOSIS — Z853 Personal history of malignant neoplasm of breast: Secondary | ICD-10-CM | POA: Insufficient documentation

## 2024-02-21 DIAGNOSIS — N61 Mastitis without abscess: Secondary | ICD-10-CM | POA: Insufficient documentation

## 2024-02-21 NOTE — Telephone Encounter (Signed)
 Called Pt due to No show; no answer. LM and asked pt to call back if she wants to reschedule and gave her the phone number.

## 2024-02-26 NOTE — Therapy (Deleted)
 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   HTN (hypertension) 12/30/2018   Hyperlipidemia    Hypertension    Hyponatremia 12/30/2018   Hypothyroid 02/03/2016   Hypothyroidism    Nonobstructive atherosclerosis of coronary artery 08/03/2019   Stroke (HCC)    Tobacco use 07/09/2016   Past Surgical History:  Procedure Laterality Date   BREAST BIOPSY  12/2018   at breast center in [place]   CARDIAC CATHETERIZATION     CAROTID ENDARTERECTOMY     CARPAL TUNNEL RELEASE     CYST EXCISION     ENDARTERECTOMY Left 01/04/2019   Procedure: ENDARTERECTOMY CAROTID LEFT;  Surgeon: Nada Libman, MD;  Location: [facility] OR;  Service: Vascular;  Laterality: Left;   LEFT HEART CATH AND CORONARY ANGIOGRAPHY N/A 01/03/2019   Procedure: LEFT HEART CATH AND CORONARY ANGIOGRAPHY;  Surgeon: Lyn Records, MD;  Location: [facility] INVASIVE CV LAB;  Service: Cardiovascular;  Laterality: N/A;   PATCH ANGIOPLASTY Left 01/04/2019   Procedure: PATCH ANGIOPLASTY USING Livia Snellen BIOLOGIC PATCH;  Surgeon: Nada Libman, MD;  Location: [facility] OR;  Service: Vascular;  Laterality: Left;   Patient Active Problem List   Diagnosis Date Noted   Stroke [facility])    Hypothyroidism    Hyperlipidemia    Carotid artery occlusion    Back pain    Asthma    Bilateral breast cancer (HCC) 11/22/2019   Nonobstructive atherosclerosis of coronary artery 08/03/2019   CAD (coronary artery disease) 01/03/2019   CVA (cerebral vascular accident)  (HCC) 12/30/2018   HTN (hypertension) 12/30/2018   COPD (chronic obstructive pulmonary disease) (HCC) 12/30/2018   AKI (acute kidney injury) (HCC) 12/30/2018   Hyponatremia 12/30/2018   Breast cancer in female [facility]) 12/28/2018    Class: History of   Breast cancer of upper-outer quadrant of left female breast (HCC) 12/28/2018    Class: History of   Acute pharyngitis 10/29/2016   Essential hypertension 10/23/2016   Anxiety 10/12/2016   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  ONSET DATE: Jan 2025  Rationale for Evaluation and Treatment: Rehabilitation  SUBJECTIVE:  SUBJECTIVE STATEMENT:     EVAL(01/20/2024):See history Antibiotic was finished and breast redness gone after antibiotics in Nov/Dec but she developed redness and swelling in bilateral breasts after having a mammogram done in January 2025. She still has redness but MD feels she is not infected. Her breasts are tender but not painful. She feels firm areas in her breast. She does not have a compression bra.   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 This was followed by radiation, and Anastozole.Pt had ist episode of bilateral mastitis in 12/2022 which resolved. In November it returned and she was placed on antibiotics and had follow up on 11/26/ and 11/23/23 with mastitis showing improvement. She had improved and was doing well until her mammogram in January and she developed swelling in bilateral breasts the next day.  Mammogram  did not show any abnormalities. She was seen on 01/18/2024 with improvement noted but still slightly red  PAIN:  Are you having pain? Yes NPRS scale:0- 4-5/10 at worst/10 Pain location: bilateral breast greatest on the right Pain orientation: Bilateral  PAIN TYPE: sharp Pain description: intermittent  Aggravating factors: nothing just hits Relieving factors: tylenol  PRECAUTIONS: bilateral UE lymphedema risk, COPD, CVA , CAD, hypothyroidism  RED FLAGS: Bowel or bladder incontinence: Yes:      WEIGHT BEARING RESTRICTIONS: No  FALLS:  Has patient fallen in last 6 months? No  LIVING ENVIRONMENT: Lives with: lives with their spouse Lives in: Mobile home Stairs: No;  Has following equipment at home:   OCCUPATION: disabled  LEISURE: read, watch TV, some walking  HAND DOMINANCE: right   PRIOR LEVEL OF FUNCTION: Independent  PATIENT GOALS: Decrease swelling and tenderness   OBJECTIVE: Note: Objective measures were completed at Evaluation unless otherwise noted.  COGNITION: Overall cognitive status: Within functional limits for tasks assessed   PALPATION: Bilateral breasts warm to touch  OBSERVATIONS / OTHER ASSESSMENTS:01/20/2024 Bilateral breast with significant redness but was seen by MD 2 days ago. Left breast with very small skin tear underneath with redness and irritation. Fibrosis noted medal breast. Nipple very swollen, and retracted, Right breast also with redness, medial fibrosis, nipple swelling 02/28/2024  SENSATION: Light touch: Appears intact    POSTURE: forward head, rounded shoulders  UPPER EXTREMITY AROM/PROM:  A/PROM RIGHT   eval   Shoulder extension   Shoulder flexion 120  Shoulder abduction 113  Shoulder internal rotation   Shoulder external rotation     (Blank rows = not tested)  A/PROM LEFT   eval  Shoulder extension   Shoulder flexion 125  Shoulder abduction 115  Shoulder internal rotation   Shoulder external rotation      (Blank rows = not tested)  CERVICAL AROM: All within functional limits:      UPPER EXTREMITY STRENGTH:   LYMPHEDEMA ASSESSMENTS:   SURGERY TYPE/DATE: Bilateral lumpectomies 03/09/2019  NUMBER OF LYMPH NODES REMOVED: 0/1 Bilaterally  CHEMOTHERAPY: NO  RADIATION:YES  HORMONE TREATMENT: YES  INFECTIONS: YES   LYMPHEDEMA ASSESSMENTS:   LANDMARK RIGHT  eval  At axilla    15 cm proximal to olecranon process   10 cm proximal to olecranon process   Olecranon process   15 cm proximal to ulnar styloid process   10 cm proximal to ulnar styloid process   Just proximal to ulnar styloid process   Across hand at thumb web space   At base of 2nd digit   (Blank rows = not tested)  LANDMARK LEFT  eval  At axilla    15 cm proximal to olecranon process   10 cm proximal to olecranon process   Olecranon process   15 cm proximal to ulnar styloid process   10 cm proximal to ulnar styloid process   Just proximal to ulnar styloid process   Across hand at thumb web space   At base of 2nd digit   (Blank rows = not tested)   FUNCTIONAL TESTS:    GAIT: Distance walked: front office to room 8  Assistive device utilized: Single point cane Level of assistance: Complete Independence     BREAST COMPLAINTS QUESTIONNAIRE Pain:6 Heaviness:0 Swollen feeling:8 Tense Skin:8 Redness:9 Bra Print:0 Size of Pores:0 Hard feeling: 10 Total:    41 /80 A Score over 9 indicates lymphedema issues in the breast                                                                                                                            TREATMENT DATE:   02/28/2024    01/20/2024 Pts bilateral breasts very red,and warm and despite MD seeing her 2 days ago this therapist was concerned she may still have infection. Had Berna Spare PTA, and Dwaine Gale, PT both see pt and they were in agreement that she should not be treated at present. Messaged MD and let him know we were afraid we  would be spreading infection, and asked if he would consider putting pt on Cipro or Keflex. She has Eye Sx for cataracts scheduled on Feb 10, and 25th so she would not likely be able to come for PT until March. Made appt for Re-eval on 02/21/2024. Discussed importance of compression bra when infection clears and also asked MD to provide her with a script. Gave pt pictures of several bras and [employer]. Discussed how compression works and showed pt the wall picture of lymphatics and described that MLD is very gentle and that we will instruct her when it is safe to do so.   PATIENT EDUCATION:  Education details: Concerns of infection being spread if treated now, compression bra when infection cleared, gentle nature of MLD Person educated: Patient Education method: Explanation Education comprehension: verbalized understanding  HOME EXERCISE PROGRAM:   ASSESSMENT:  CLINICAL IMPRESSION: Patient is a 63 y.o. female who was seen today for physical therapy re-evaluation and treatment for follow up after bilateral breast mastitis. She presents with bilateral breast redness and warmth with continued signs of infection. There is swelling bilaterally, and fibrosis noted bilaterally, especially at the medial breast. Bilateral nipples are also very swollen Two other therapists also assessed and agreed that pt might benefit from another antibiotic. MD was message to see if he is in agreement and willing to try another like Cipro or Keflex. Pt has 2 eye surgeries scheduled and will return to therapy in March. She will also call MD office to be sure it is addressed and will also request a script for  a compression bra to use when redness is resolved. When infection is resolved she will benefit from skilled PT to address deficits and return pt to prior level.   OBJECTIVE IMPAIRMENTS: decreased knowledge of condition, increased edema, postural dysfunction, and pain.   ACTIVITY LIMITATIONS: sleeping and  hygiene/grooming  PARTICIPATION LIMITATIONS:  able to do most but with intermittent pain  PERSONAL FACTORS: 1-2 comorbidities: bilateral breast cancer, s/p radiation, Smoker  are also affecting patient's functional outcome.   REHAB POTENTIAL: Good  CLINICAL DECISION MAKING: Evolving/moderate complexity  EVALUATION COMPLEXITY: Moderate  GOALS: Goals reviewed with patient? Yes  SHORT TERM GOALS: Target date: 04/13/2024  Pt will purchase compression bra to decrease bilateral breast swelling Baseline: Goal status: INITIAL  2.  Pt will have decreased bilateral breast pain by 50% Baseline:  Goal status: INITIAL  3.  Pt will be  independent in MLD to bilateral breasts to decrease swelling Baseline:  Goal status: INITIAL  4.  Breast complaints questionairre will be decreased to no greater than 20 to show improvements in swelling Baseline:  Goal status: INITIAL  :   PLAN:  PT FREQUENCY: 2x/week  PT DURATION: 12 weeks, however pt will not return to PT until March due to several eye surgeries  PLANNED INTERVENTIONS: 97164- PT Re-evaluation, 97110-Therapeutic exercises, 97530- Therapeutic activity, 97535- Self Care, 16109- Manual therapy, 97760- Orthotic Fit/training, and Patient/Family education  PLAN FOR NEXT SESSION: Check breast redness/swelling fibrosis, Did pt get script for compression bra? Initiate MLD if redness improved, stress importance of compression  Waynette Buttery, PT 02/26/2024, 8:51 PM

## 2024-02-28 ENCOUNTER — Other Ambulatory Visit: Payer: Self-pay | Admitting: Cardiology

## 2024-02-28 ENCOUNTER — Ambulatory Visit

## 2024-02-28 DIAGNOSIS — I10 Essential (primary) hypertension: Secondary | ICD-10-CM

## 2024-02-28 DIAGNOSIS — I2510 Atherosclerotic heart disease of native coronary artery without angina pectoris: Secondary | ICD-10-CM

## 2024-02-28 DIAGNOSIS — E7849 Other hyperlipidemia: Secondary | ICD-10-CM

## 2024-02-28 DIAGNOSIS — I6523 Occlusion and stenosis of bilateral carotid arteries: Secondary | ICD-10-CM

## 2024-02-29 ENCOUNTER — Ambulatory Visit

## 2024-02-29 DIAGNOSIS — I890 Lymphedema, not elsewhere classified: Secondary | ICD-10-CM

## 2024-02-29 DIAGNOSIS — Z853 Personal history of malignant neoplasm of breast: Secondary | ICD-10-CM

## 2024-02-29 DIAGNOSIS — N61 Mastitis without abscess: Secondary | ICD-10-CM

## 2024-02-29 NOTE — Therapy (Signed)
 OUTPATIENT PHYSICAL THERAPY  UPPER EXTREMITY ONCOLOGY RE EVALUATION  Patient Name: Madison Medina MRN: [id number] DOB:1961-10-07, 63 y.o., female Today's Date: 02/29/2024  END OF SESSION:  PT End of Session - 02/29/24 1357     Visit Number 2    Number of Visits 12    Date for PT Re-Evaluation 04/11/24    Authorization Type [employer]    Authorization Time Period 03/01/2024    PT Start Time 1400    PT Stop Time 1455    PT Time Calculation (min) 55 min    Activity Tolerance Patient tolerated treatment well    Behavior During Therapy [facility] for tasks assessed/performed              Past Medical History:  Diagnosis Date   AKI (acute kidney injury) (HCC) 12/30/2018   Anxiety 10/12/2016   Asthma    Back pain    CAD (coronary artery disease) 01/03/2019   Carotid artery occlusion    COPD (chronic obstructive pulmonary disease) (HCC)    CVA (cerebral vascular accident) (HCC) 12/30/2018   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   HTN (hypertension) 12/30/2018   Hyperlipidemia    Hypertension    Hyponatremia 12/30/2018   Hypothyroid 02/03/2016   Hypothyroidism    Nonobstructive atherosclerosis of coronary artery 08/03/2019   Stroke (HCC)    Tobacco use 07/09/2016   Past Surgical History:  Procedure Laterality Date   BREAST BIOPSY  12/2018   at breast center in [place]   CARDIAC CATHETERIZATION     CAROTID ENDARTERECTOMY     CARPAL TUNNEL RELEASE     CYST EXCISION     ENDARTERECTOMY Left 01/04/2019   Procedure: ENDARTERECTOMY CAROTID LEFT;  Surgeon: Nada Libman, MD;  Location: [facility] OR;  Service: Vascular;  Laterality: Left;   LEFT HEART CATH AND CORONARY ANGIOGRAPHY N/A 01/03/2019   Procedure: LEFT HEART CATH AND CORONARY ANGIOGRAPHY;  Surgeon: Lyn Records, MD;  Location: [facility] INVASIVE CV LAB;  Service: Cardiovascular;  Laterality: N/A;   PATCH ANGIOPLASTY Left 01/04/2019   Procedure: PATCH ANGIOPLASTY USING Livia Snellen BIOLOGIC PATCH;  Surgeon: Nada Libman, MD;   Location[facility] OR;  Service: Vascular;  Laterality: Left;   Patient Active Problem List   Diagnosis Date Noted   Stroke [facility])    Hypothyroidism    Hyperlipidemia    Carotid artery occlusion    Back pain    Asthma    Bilateral breast cancer (HCC) 11/22/2019   Nonobstructive atherosclerosis of coronary artery 08/03/2019   CAD (coronary artery disease) 01/03/2019   CVA (cerebral vascular accident) (HCC) 12/30/2018   HTN (hypertension) 12/30/2018   COPD (chronic obstructive pulmonary disease) (HCC) 12/30/2018   AKI (acute kidney injury) (HCC) 12/30/2018   Hyponatremia 12/30/2018   Breast cancer in female (HCC) 12/28/2018    Class: History of   Breast cancer of upper-outer quadrant of left female breast (HCC) 12/28/2018    Class: History of   Acute pharyngitis 10/29/2016   Essential hypertension 10/23/2016   Anxiety 10/12/2016   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 Both breasts are still a little red and tender but feel much better. I started a second round of antibiotics about 6 days ago and it ends tomorrow.   EVAL(01/20/2024):See history Antibiotic was finished and breast redness gone after antibiotics in Nov/Dec but she  developed redness and swelling in bilateral breasts after having a mammogram done in January 2025. She still has redness but MD feels she is not infected. Her breasts are tender but not painful. She feels firm areas in her breast. She does not have a compression bra.   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 This was followed by radiation, and Anastozole.Pt had ist episode of bilateral mastitis in 12/2022 which resolved. In November it returned and she was placed on antibiotics and had follow up on 11/26/ and 11/23/23 with mastitis showing improvement. She had improved and was doing well until her mammogram in January and she developed swelling in bilateral breasts the next day. Mammogram  did not show any abnormalities. She was seen on 01/18/2024 with improvement noted but still slightly red  PAIN:  Are you having pain? Yes, just a little NPRS scale:1/10 Pain location: bilateral breast greatest on the right Pain orientation: Bilateral  PAIN TYPE: sharp Pain description: intermittent  Aggravating factors: nothing just hits Relieving factors: tylenol  PRECAUTIONS: bilateral UE lymphedema risk, COPD, CVA , CAD, hypothyroidism  RED FLAGS: Bowel or bladder incontinence: Yes:      WEIGHT BEARING RESTRICTIONS: No  FALLS:  Has patient fallen in last 6 months? No  LIVING ENVIRONMENT: Lives with: lives with their spouse Lives in: Mobile home Stairs: No;  Has following equipment at home:   OCCUPATION: disabled  LEISURE: read, watch TV, some walking  HAND DOMINANCE: right   PRIOR LEVEL OF FUNCTION: Independent  PATIENT GOALS: Decrease swelling and tenderness   OBJECTIVE: Note: Objective measures were completed at Evaluation unless otherwise noted.  COGNITION: Overall cognitive status: Within functional limits for tasks assessed   PALPATION: Bilateral breasts warm to touch  OBSERVATIONS / OTHER ASSESSMENTS:01/20/2024  Bilateral breast with significant redness but was seen by MD 2 days ago. Left breast with very small skin tear underneath with redness and irritation. Fibrosis noted medal breast. Nipple very swollen, and retracted, Right breast also with redness, medial fibrosis, nipple swelling 02/29/2024 Decreased redness bilateral breast;fibrosis still noted medially bilaterally with swollen nipples, Very red and irritated under bilateral breast; appears to be very moist and appears to be fungal. Pt will try anti-fungal cream/powder and will ask MD if she requires something that is prescription     SENSATION: Light touch: Appears intact    POSTURE: forward head, rounded shoulders  UPPER EXTREMITY AROM/PROM:  A/PROM RIGHT   eval   Shoulder extension   Shoulder flexion 120  Shoulder abduction 113  Shoulder internal rotation   Shoulder external rotation     (Blank rows = not tested)  A/PROM LEFT   eval  Shoulder extension   Shoulder flexion 125  Shoulder abduction 115  Shoulder internal rotation   Shoulder external rotation     (Blank rows = not tested)  CERVICAL AROM: All within functional limits:      UPPER EXTREMITY STRENGTH:   LYMPHEDEMA ASSESSMENTS:   SURGERY TYPE/DATE: Bilateral lumpectomies 03/09/2019  NUMBER OF LYMPH NODES REMOVED: 0/1  Bilaterally  CHEMOTHERAPY: NO  RADIATION:YES  HORMONE TREATMENT: YES  INFECTIONS: YES   LYMPHEDEMA ASSESSMENTS:   LANDMARK RIGHT  eval  At axilla    15 cm proximal to olecranon process   10 cm proximal to olecranon process   Olecranon process   15 cm proximal to ulnar styloid process   10 cm proximal to ulnar styloid process   Just proximal to ulnar styloid process   Across hand at thumb web space   At base of 2nd digit   (Blank rows = not tested)  LANDMARK LEFT  eval  At axilla    15 cm proximal to olecranon process   10 cm proximal to olecranon process   Olecranon process   15 cm proximal to ulnar styloid process    10 cm proximal to ulnar styloid process   Just proximal to ulnar styloid process   Across hand at thumb web space   At base of 2nd digit   (Blank rows = not tested)   FUNCTIONAL TESTS:    GAIT: Distance walked: front office to room 8  Assistive device utilized: Single point cane Level of assistance: Complete Independence     BREAST COMPLAINTS QUESTIONNAIRE(01/20/2024) Pain:6 Heaviness:0 Swollen feeling:8 Tense Skin:8 Redness:9 Bra Print:0 Size of Pores:0 Hard feeling: 10 Total:    41 /80 A Score over 9 indicates lymphedema issues in the breast BREAST COMPLAINTS QUESTIONNAIRE(02/29/2024) Pain:1 Heaviness:2 Swollen feeling:1 Tense Skin:1 Redness:2 Bra Print:0 Size of Pores:1 Hard feeling: 1 Total:   9  /80 A Score over 9 indicates lymphedema issues in the breast                                                                                                                         TREATMENT DATE:   02/29/2024 Assessed bilateral breasts and had Diannia Ruder, PT also check. In agreement OK to start MLD. Checked areas under breasts and feel it is fungal. Pt advised to try an anti fungal powder, and was given several telfa pads to put under breasts to prevent irritation. Showed pt lymphatics picture and explained superficial nature of technique and basic anatomy of lymphatics. Initiated MLD to left breast;short neck, 5 diaphragmatic breaths, left axillary and left inguinal LN's, left axillo-inguinal pathway, left lateral breast directing to axillo-inguinal pathway and retracing pathway and then left medial breast directing laterally and to axillo-inguinal  pathway retracing steps and then ending with left axillary and left inguinal LN's.  Repeated on the right as per the left. Pt was given a prescription to get a compression bra and she will get Dr. Lequita Halt to sign it so she may go to [facility] to get fit for a compression bra.   01/20/2024 Pts bilateral breasts very red,and  warm and despite MD seeing her 2 days ago this therapist was concerned she may still have infection. Had Berna Spare PTA, and Dwaine Gale, PT both see pt and they were in agreement that she should not be  treated at present. Messaged MD and let him know we were afraid we would be spreading infection, and asked if he would consider putting pt on Cipro or Keflex. She has Eye Sx for cataracts scheduled on Feb 10, and 25th so she would not likely be able to come for PT until March. Made appt for Re-eval on 02/21/2024. Discussed importance of compression bra when infection clears and also asked MD to provide her with a script. Gave pt pictures of several bras and [facility] [employer]. Discussed how compression works and showed pt the wall picture of lymphatics and described that MLD is very gentle and that we will instruct her when it is safe to do so.   PATIENT EDUCATION:   02/29/2024:   Education details: Concerns of infection being spread if treated now, compression bra when infection cleared, gentle nature of MLD Person educated: Patient Education method: Explanation Education comprehension: verbalized understanding  HOME EXERCISE PROGRAM:   ASSESSMENT:  CLINICAL IMPRESSION: Pt was seen for re;assessment today for bilateral breast mastitis/cellulitis. Since last seen pt was hospitalized several days for Flu/pneumonia and is presently on O2. She started another antibiotic 6 days ago and breast redness, warmth and swelling is greatly improved with Breast Complaints Questionnaire improving from 41 to 9 points. She has some significant moist redness/irritation beneath bilateral breasts that appears to be fungal and she will try some anti fungal powder/cream and telfa pads to try and decrease irritation. She is also going to ask Dr. Lequita Halt to sign a script for a compression bra which she will get in [place] [facility] [place]. Treatment with MLD and brief instruction to pt was initiated to  bilateral breasts this afternoon in supine directing to the axillo-inguinal pathways on the ipsalateral side. She will benefit from skilled PT to address swelling, instruct pt in MLD, and assess compression bra. Addressing swelling is very important to prevent future infections.   OBJECTIVE IMPAIRMENTS: decreased knowledge of condition, increased edema, postural dysfunction, and pain.   ACTIVITY LIMITATIONS: sleeping and hygiene/grooming  PARTICIPATION LIMITATIONS:  able to do most but with intermittent pain  PERSONAL FACTORS: 1-2 comorbidities: bilateral breast cancer, s/p radiation, Smoker  are also affecting patient's functional outcome.   REHAB POTENTIAL: Good  CLINICAL DECISION MAKING: Evolving/moderate complexity  EVALUATION COMPLEXITY: Moderate  GOALS: Goals reviewed with patient? Yes  SHORT TERM GOALS: Target date: 04/13/2024  Pt will purchase compression bra to decrease bilateral breast swelling Baseline: Goal status: INITIAL  2.  Pt will have decreased bilateral breast pain by 50% Baseline:  Goal status: INITIAL  3.  Pt will be  independent in MLD to bilateral breasts to decrease swelling Baseline:  Goal status: INITIAL  4.  Breast complaints questionairre will be decreased to no greater than 20 to show improvements in swelling Baseline:  Goal status: INITIAL  :   PLAN:  PT FREQUENCY:1- 2x/week  PT DURATION: 12 weeks, however pt will not return to PT until March due to several eye surgeries  PLANNED INTERVENTIONS: 97164- PT Re-evaluation, 97110-Therapeutic exercises, 97530- Therapeutic activity, 97535- Self Care, 16109- Manual therapy, 97760- Orthotic Fit/training, and Patient/Family education  PLAN FOR NEXT SESSION: Check breast redness/swelling fibrosis, Did pt get script for compression bra? continue MLD if redness improved, stress importance of compression, check under breasts area of redness/moistness  Waynette Buttery, PT 02/29/2024, 5:22 PM

## 2024-03-08 ENCOUNTER — Ambulatory Visit

## 2024-03-08 DIAGNOSIS — Z853 Personal history of malignant neoplasm of breast: Secondary | ICD-10-CM

## 2024-03-08 DIAGNOSIS — N61 Mastitis without abscess: Secondary | ICD-10-CM | POA: Diagnosis not present

## 2024-03-08 DIAGNOSIS — I890 Lymphedema, not elsewhere classified: Secondary | ICD-10-CM

## 2024-03-08 NOTE — Therapy (Signed)
 OUTPATIENT PHYSICAL THERAPY  UPPER EXTREMITY ONCOLOGY RE EVALUATION  Patient Name: Madison Medina MRN: [id number] DOB:01/27/1961, 63 y.o., female Today's Date: 03/08/2024  END OF SESSION:  PT End of Session - 03/08/24 0902     Visit Number 3    Number of Visits 12    Date for PT Re-Evaluation 04/11/24    Authorization Type [employer]    Authorization Time Period 3/18-4/29/2025 1 re-eval and 12 visits    Authorization - Visit Number 1    Authorization - Number of Visits 12    PT Start Time 0903    PT Stop Time 0952    PT Time Calculation (min) 49 min    Equipment Utilized During Treatment Oxygen    Activity Tolerance Patient tolerated treatment well    Behavior During Therapy [facility] for tasks assessed/performed              Past Medical History:  Diagnosis Date   AKI (acute kidney injury) (HCC) 12/30/2018   Anxiety 10/12/2016   Asthma    Back pain    CAD (coronary artery disease) 01/03/2019   Carotid artery occlusion    COPD (chronic obstructive pulmonary disease) (HCC)    CVA (cerebral vascular accident) (HCC) 12/30/2018   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   HTN (hypertension) 12/30/2018   Hyperlipidemia    Hypertension    Hyponatremia 12/30/2018   Hypothyroid 02/03/2016   Hypothyroidism    Nonobstructive atherosclerosis of coronary artery 08/03/2019   Stroke (HCC)    Tobacco use 07/09/2016   Past Surgical History:  Procedure Laterality Date   BREAST BIOPSY  12/2018   at breast center in [place]   CARDIAC CATHETERIZATION     CAROTID ENDARTERECTOMY     CARPAL TUNNEL RELEASE     CYST EXCISION     ENDARTERECTOMY Left 01/04/2019   Procedure: ENDARTERECTOMY CAROTID LEFT;  Surgeon: Nada Libman, MD;  Location: [facility] OR;  Service: Vascular;  Laterality: Left;   LEFT HEART CATH AND CORONARY ANGIOGRAPHY N/A 01/03/2019   Procedure: LEFT HEART CATH AND CORONARY ANGIOGRAPHY;  Surgeon: Lyn Records, MD;  Location: [facility] INVASIVE CV LAB;  Service: Cardiovascular;   Laterality: N/A;   PATCH ANGIOPLASTY Left 01/04/2019   Procedure: PATCH ANGIOPLASTY USING Livia Snellen BIOLOGIC PATCH;  Surgeon: Nada Libman, MD;  Location: [facility] OR;  Service: Vascular;  Laterality: Left;   Patient Active Problem List   Diagnosis Date Noted   Stroke [facility])    Hypothyroidism    Hyperlipidemia    Carotid artery occlusion    Back pain    Asthma    Bilateral breast cancer (HCC) 11/22/2019   Nonobstructive atherosclerosis of coronary artery 08/03/2019   CAD (coronary artery disease) 01/03/2019   CVA (cerebral vascular accident) (HCC) 12/30/2018   HTN (hypertension) 12/30/2018   COPD (chronic obstructive pulmonary disease) (HCC) 12/30/2018   AKI (acute kidney injury) (HCC) 12/30/2018   Hyponatremia 12/30/2018   Breast cancer in female [facility]) 12/28/2018    Class: History of   Breast cancer of upper-outer quadrant of left female breast (HCC) 12/28/2018    Class: History of   Acute pharyngitis 10/29/2016   Essential hypertension 10/23/2016   Anxiety 10/12/2016   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   Tobacco use 07/09/2016   Sleep apnea 07/09/2016   Hypothyroid 02/03/2016   COPD exacerbation (HCC) 02/03/2016    PCP: Pershing Proud, FNP  REFERRING PROVIDER: Marvis Moeller, MD  REFERRING DIAG: Cellulitis of left breast  THERAPY DIAG:  Cellulitis of  breast  Lymphedema, not elsewhere classified  History of bilateral breast cancer  ONSET DATE: Jan 2025  Rationale for Evaluation and Treatment: Rehabilitation  SUBJECTIVE:                                                                                                                                                                                           SUBJECTIVE STATEMENT:   03/08/2024 I have finished the antibiotics and it looks a lot better. Underneath the breast is much better. It feels like it might be a little red underneath with my new compression bra. The swelling is much better with the  compression bra, but I get some burning at the sides of my breast later in the day so I take it off.   02/29/2024 A couple days after I was here I got flu A and pneumonia and I have to use O2 for another 6 weeks. Both breasts are still a little red and tender but feel much better. I started a second round of antibiotics about 6 days ago and it ends tomorrow.   EVAL(01/20/2024):See history Antibiotic was finished and breast redness gone after antibiotics in Nov/Dec but she developed redness and swelling in bilateral breasts after having a mammogram done in January 2025. She still has redness but MD feels she is not infected. Her breasts are tender but not painful. She feels firm areas in her breast. She does not have a compression bra.   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 This was followed by radiation, and Anastozole.Pt had ist episode of bilateral mastitis in 12/2022 which resolved. In November it returned and she was placed on antibiotics and had follow up on 11/26/ and 11/23/23 with mastitis showing improvement. She had improved and was doing well until her mammogram in January and she developed swelling in bilateral breasts the next day. Mammogram  did not show any abnormalities. She was seen on 01/18/2024 with improvement noted but still slightly red  PAIN:  Are you having pain? Yes, just a little NPRS scale:1/10 Pain location: bilateral breast greatest on the right Pain orientation: Bilateral  PAIN TYPE: sharp Pain description: intermittent  Aggravating factors: nothing just hits Relieving factors: tylenol  PRECAUTIONS: bilateral UE lymphedema risk, COPD, CVA , CAD, hypothyroidism  RED FLAGS: Bowel or bladder incontinence: Yes:      WEIGHT BEARING RESTRICTIONS: No  FALLS:  Has patient fallen in last 6 months? No  LIVING ENVIRONMENT: Lives with: lives with their spouse Lives in: Mobile home Stairs: No;  Has following  equipment at home:   OCCUPATION: disabled  LEISURE: read, watch TV, some walking  HAND DOMINANCE: right   PRIOR LEVEL OF FUNCTION: Independent  PATIENT GOALS: Decrease swelling and tenderness   OBJECTIVE: Note: Objective measures were completed at Evaluation unless otherwise noted.  COGNITION: Overall cognitive status: Within functional limits for tasks assessed   PALPATION: Bilateral breasts warm to touch  OBSERVATIONS / OTHER ASSESSMENTS:01/20/2024 Bilateral breast with significant redness but was seen by MD 2 days ago. Left breast with very small skin tear underneath with redness and irritation. Fibrosis noted medal breast. Nipple very swollen, and retracted, Right breast also with redness, medial fibrosis, nipple swelling 02/29/2024 Decreased redness bilateral breast;fibrosis still noted medially bilaterally with swollen nipples, Very red and irritated under bilateral breast; appears to be very moist and appears to be fungal. Pt will try anti-fungal cream/powder and will ask MD if she requires something that is prescription     SENSATION: Light touch: Appears intact    POSTURE: forward head, rounded shoulders  UPPER EXTREMITY AROM/PROM:  A/PROM RIGHT   eval   Shoulder extension   Shoulder flexion 120  Shoulder abduction 113  Shoulder internal rotation   Shoulder external rotation     (Blank rows = not tested)  A/PROM LEFT   eval  Shoulder extension   Shoulder flexion 125  Shoulder abduction 115  Shoulder internal rotation   Shoulder external rotation     (Blank rows = not tested)  CERVICAL AROM: All within functional limits:      UPPER EXTREMITY STRENGTH:   LYMPHEDEMA ASSESSMENTS:   SURGERY TYPE/DATE: Bilateral lumpectomies 03/09/2019  NUMBER OF LYMPH NODES REMOVED: 0/1 Bilaterally  CHEMOTHERAPY: NO  RADIATION:YES  HORMONE TREATMENT: YES  INFECTIONS: YES   LYMPHEDEMA ASSESSMENTS:   LANDMARK RIGHT  eval  At axilla    15 cm proximal to  olecranon process   10 cm proximal to olecranon process   Olecranon process   15 cm proximal to ulnar styloid process   10 cm proximal to ulnar styloid process   Just proximal to ulnar styloid process   Across hand at thumb web space   At base of 2nd digit   (Blank rows = not tested)  LANDMARK LEFT  eval  At axilla    15 cm proximal to olecranon process   10 cm proximal to olecranon process   Olecranon process   15 cm proximal to ulnar styloid process   10 cm proximal to ulnar styloid process   Just proximal to ulnar styloid process   Across hand at thumb web space   At base of 2nd digit   (Blank rows = not tested)   FUNCTIONAL TESTS:    GAIT: Distance walked: front office to room 8  Assistive device utilized: Single point cane Level of assistance: Complete Independence     BREAST COMPLAINTS QUESTIONNAIRE(01/20/2024) Pain:6 Heaviness:0 Swollen feeling:8 Tense Skin:8 Redness:9 Bra Print:0 Size of Pores:0 Hard feeling: 10 Total:    41 /80 A Score over 9 indicates lymphedema issues in the breast BREAST COMPLAINTS QUESTIONNAIRE(02/29/2024) Pain:1 Heaviness:2 Swollen feeling:1 Tense Skin:1 Redness:2 Bra Print:0 Size of Pores:1 Hard feeling: 1 Total:   9  /80 A Score over 9 indicates lymphedema issues in the breast  TREATMENT DATE:  03/08/2024 2 rectangular foam pads made for lower border of bra and placed in TG soft Initiated Breast MLD In supine: Short neck, 5 diaphragmatic breaths, L axillary nodes and establishment of interaxillary pathway, L inguinal nodes and establishment of axilloinguinal pathway, then L breast lateral moving fluid towards pathway spending extra time in any areas of fibrosis then retracing all steps and then medial breast toward pathway and ending with LN's. Explained sequence, pressure and technique to pt while  performing. Repeated on right breast with same technique. Discussed if pt would be comfortable with her sister learning MLD due to O2 and difficulty reaching, and will add chip packs next visit   02/29/2024 Assessed bilateral breasts and had Diannia Ruder, PT also check. In agreement OK to start MLD. Checked areas under breasts and feel it is fungal. Pt advised to try an anti fungal powder, and was given several telfa pads to put under breasts to prevent irritation. Showed pt lymphatics picture and explained superficial nature of technique and basic anatomy of lymphatics. Initiated MLD to left breast;short neck, 5 diaphragmatic breaths, left axillary and left inguinal LN's, left axillo-inguinal pathway, left lateral breast directing to axillo-inguinal pathway and retracing pathway and then left medial breast directing laterally and to axillo-inguinal  pathway retracing steps and then ending with left axillary and left inguinal LN's.  Repeated on the right as per the left. Pt was given a prescription to get a compression bra and she will get Dr. Lequita Halt to sign it so she may go to [facility] [employer] to get fit for a compression bra.   01/20/2024 Pts bilateral breasts very red,and warm and despite MD seeing her 2 days ago this therapist was concerned she may still have infection. Had Berna Spare PTA, and Dwaine Gale, PT both see pt and they were in agreement that she should not be treated at present. Messaged MD and let him know we were afraid we would be spreading infection, and asked if he would consider putting pt on Cipro or Keflex. She has Eye Sx for cataracts scheduled on Feb 10, and 25th so she would not likely be able to come for PT until March. Made appt for Re-eval on 02/21/2024. Discussed importance of compression bra when infection clears and also asked MD to provide her with a script. Gave pt pictures of several bras and [facility] [employer]. Discussed how compression works and showed pt the wall  picture of lymphatics and described that MLD is very gentle and that we will instruct her when it is safe to do so.   PATIENT EDUCATION:   02/29/2024:   Education details: Concerns of infection being spread if treated now, compression bra when infection cleared, gentle nature of MLD Person educated: Patient Education method: Explanation Education comprehension: verbalized understanding  HOME EXERCISE PROGRAM:   ASSESSMENT:  CLINICAL IMPRESSION:  Breast redness greatly improved and under breast irritation is healed. Bilateral breast lymphedema with medial breast fibrosis the worst, but some lateral fibrosis. Breasts did soften with MLD. Will instruct pt, and possibly her sister next visit.  OBJECTIVE IMPAIRMENTS: decreased knowledge of condition, increased edema, postural dysfunction, and pain.   ACTIVITY LIMITATIONS: sleeping and hygiene/grooming  PARTICIPATION LIMITATIONS:  able to do most but with intermittent pain  PERSONAL FACTORS: 1-2 comorbidities: bilateral breast cancer, s/p radiation, Smoker  are also affecting patient's functional outcome.   REHAB POTENTIAL: Good  CLINICAL DECISION MAKING: Evolving/moderate complexity  EVALUATION COMPLEXITY: Moderate  GOALS: Goals reviewed with patient?  Yes  SHORT TERM GOALS: Target date: 04/13/2024  Pt will purchase compression bra to decrease bilateral breast swelling Baseline: Goal status: MET 03/08/2024 2.  Pt will have decreased bilateral breast pain by 50% Baseline:  Goal status: INITIAL  3.  Pt will be  independent in MLD to bilateral breasts to decrease swelling Baseline:  Goal status: INITIAL  4.  Breast complaints questionairre will be decreased to no greater than 20 to show improvements in swelling Baseline:  Goal status: INITIAL  :   PLAN:  PT FREQUENCY:1- 2x/week  PT DURATION: 12 weeks, however pt will not return to PT until March due to several eye surgeries  PLANNED INTERVENTIONS: 97164- PT  Re-evaluation, 97110-Therapeutic exercises, 97530- Therapeutic activity, 97535- Self Care, 16109- Manual therapy, 97760- Orthotic Fit/training, and Patient/Family education  PLAN FOR NEXT SESSION: would her sister help with MLD?, make chip pack for medial breasts, start instructing pt; only comes 1x per week.Check breast redness/swelling fibrosis, continue MLD  stress importance of compression, check under breasts area of redness/moistness  Waynette Buttery, PT 03/08/2024, 9:59 AM

## 2024-03-15 ENCOUNTER — Ambulatory Visit: Attending: Surgery

## 2024-03-15 DIAGNOSIS — N61 Mastitis without abscess: Secondary | ICD-10-CM | POA: Diagnosis present

## 2024-03-15 DIAGNOSIS — Z853 Personal history of malignant neoplasm of breast: Secondary | ICD-10-CM | POA: Diagnosis present

## 2024-03-15 DIAGNOSIS — I890 Lymphedema, not elsewhere classified: Secondary | ICD-10-CM | POA: Insufficient documentation

## 2024-03-15 NOTE — Therapy (Signed)
 OUTPATIENT PHYSICAL THERAPY  UPPER EXTREMITY ONCOLOGY RE EVALUATION  Patient Name: Madison Medina MRN: [id number] DOB:[birth year], 63 y.o., female Today's Date: 03/15/2024  END OF SESSION:  PT End of Session - 03/15/24 0856     Visit Number 4    Number of Visits 12    Date for PT Re-Evaluation 04/11/24    Authorization Type [employer]    Authorization Time Period 3/18-4/29/2025 1 re-eval and 12 visits    Authorization - Visit Number 2    Authorization - Number of Visits 12    PT Start Time 0900    PT Stop Time 0958    PT Time Calculation (min) 58 min    Equipment Utilized During Treatment Oxygen    Activity Tolerance Patient tolerated treatment well    Behavior During Therapy [facility] for tasks assessed/performed              Past Medical History:  Diagnosis Date   AKI (acute kidney injury) (HCC) 12/30/2018   Anxiety 10/12/2016   Asthma    Back pain    CAD (coronary artery disease) 01/03/2019   Carotid artery occlusion    COPD (chronic obstructive pulmonary disease) (HCC)    CVA (cerebral vascular accident) (HCC) 12/30/2018   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   HTN (hypertension) 12/30/2018   Hyperlipidemia    Hypertension    Hyponatremia 12/30/2018   Hypothyroid 02/03/2016   Hypothyroidism    Nonobstructive atherosclerosis of coronary artery 08/03/2019   Stroke (HCC)    Tobacco use 07/09/2016   Past Surgical History:  Procedure Laterality Date   BREAST BIOPSY  12/2018   at breast center in [place]   CARDIAC CATHETERIZATION     CAROTID ENDARTERECTOMY     CARPAL TUNNEL RELEASE     CYST EXCISION     ENDARTERECTOMY Left 01/04/2019   Procedure: ENDARTERECTOMY CAROTID LEFT;  Surgeon: Nada Libman, MD;  Location: [facility] OR;  Service: Vascular;  Laterality: Left;   LEFT HEART CATH AND CORONARY ANGIOGRAPHY N/A 01/03/2019   Procedure: LEFT HEART CATH AND CORONARY ANGIOGRAPHY;  Surgeon: Lyn Records, MD;  Location: [facility] INVASIVE CV LAB;  Service: Cardiovascular;   Laterality: N/A;   PATCH ANGIOPLASTY Left 01/04/2019   Procedure: PATCH ANGIOPLASTY USING Livia Snellen BIOLOGIC PATCH;  Surgeon: Nada Libman, MD;  Location: [facility] OR;  Service: Vascular;  Laterality: Left;   Patient Active Problem List   Diagnosis Date Noted   Stroke [facility])    Hypothyroidism    Hyperlipidemia    Carotid artery occlusion    Back pain    Asthma    Bilateral breast cancer (HCC) 11/22/2019   Nonobstructive atherosclerosis of coronary artery 08/03/2019   CAD (coronary artery disease) 01/03/2019   CVA (cerebral vascular accident) (HCC) 12/30/2018   HTN (hypertension) 12/30/2018   COPD (chronic obstructive pulmonary disease) (HCC) 12/30/2018   AKI (acute kidney injury) (HCC) 12/30/2018   Hyponatremia 12/30/2018   Breast cancer in female 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   Tobacco use 07/09/2016   Sleep apnea 07/09/2016   Hypothyroid 02/03/2016   COPD exacerbation (HCC) 02/03/2016    PCP: Pershing Proud, FNP  REFERRING PROVIDER: Marvis Moeller, MD  REFERRING DIAG: Cellulitis of left breast  THERAPY DIAG:  Cellulitis of  breast  Lymphedema, not elsewhere classified  History of bilateral breast cancer  ONSET DATE: Jan 2025  Rationale for Evaluation and Treatment: Rehabilitation  SUBJECTIVE:                                                                                                                                                                                           SUBJECTIVE STATEMENT:   03/16/2023 The compression bra is doing well and I still wear the pads underneath it to keep it from riding up. The left breast feels heavier than the right. I have been wearing the compression bra during the  day.   EVAL(01/20/2024):See history Antibiotic was finished and breast redness gone after antibiotics in Nov/Dec but she developed redness and swelling in bilateral breasts after having a mammogram done in January 2025. She still has redness but MD feels she is not infected. Her breasts are tender but not painful. She feels firm areas in her breast. She does not have a compression bra.   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 This was followed by radiation, and Anastozole.Pt had ist episode of bilateral mastitis in 12/2022 which resolved. In November it returned and she was placed on antibiotics and had follow up on 11/26/ and 11/23/23 with mastitis showing improvement. She had improved and was doing well until her mammogram in January and she developed swelling in bilateral breasts the next day. Mammogram  did not show any abnormalities. She was seen on 01/18/2024 with improvement noted but still slightly red  PAIN:  Are you having pain? No NPRS scale:0/10 Pain location: bilateral breast greatest on the right Pain orientation: Bilateral  PAIN TYPE: sharp Pain description: intermittent  Aggravating factors: nothing just hits Relieving factors: tylenol  PRECAUTIONS: bilateral UE lymphedema risk, COPD, CVA , CAD, hypothyroidism  RED FLAGS: Bowel or bladder incontinence: Yes:      WEIGHT BEARING RESTRICTIONS: No  FALLS:  Has patient fallen in last 6 months? No  LIVING ENVIRONMENT: Lives with: lives with their spouse Lives in: Mobile home Stairs: No;  Has following equipment at home:   OCCUPATION: disabled  LEISURE: read, watch TV, some walking  HAND DOMINANCE: right   PRIOR LEVEL OF FUNCTION: Independent  PATIENT GOALS: Decrease swelling and tenderness   OBJECTIVE: Note: Objective measures were completed at Evaluation unless otherwise noted.  COGNITION: Overall cognitive status: Within functional limits for tasks  assessed   PALPATION: Bilateral breasts warm to touch  OBSERVATIONS / OTHER ASSESSMENTS:01/20/2024 Bilateral breast with significant redness but was seen by MD 2 days ago. Left breast with very small skin  tear underneath with redness and irritation. Fibrosis noted medal breast. Nipple very swollen, and retracted, Right breast also with redness, medial fibrosis, nipple swelling 02/29/2024 Decreased redness bilateral breast;fibrosis still noted medially bilaterally with swollen nipples, Very red and irritated under bilateral breast; appears to be very moist and appears to be fungal. Pt will try anti-fungal cream/powder and will ask MD if she requires something that is prescription     SENSATION: Light touch: Appears intact    POSTURE: forward head, rounded shoulders  UPPER EXTREMITY AROM/PROM:  A/PROM RIGHT   eval   Shoulder extension   Shoulder flexion 120  Shoulder abduction 113  Shoulder internal rotation   Shoulder external rotation     (Blank rows = not tested)  A/PROM LEFT   eval  Shoulder extension   Shoulder flexion 125  Shoulder abduction 115  Shoulder internal rotation   Shoulder external rotation     (Blank rows = not tested)  CERVICAL AROM: All within functional limits:      UPPER EXTREMITY STRENGTH:   LYMPHEDEMA ASSESSMENTS:   SURGERY TYPE/DATE: Bilateral lumpectomies 03/09/2019  NUMBER OF LYMPH NODES REMOVED: 0/1 Bilaterally  CHEMOTHERAPY: NO  RADIATION:YES  HORMONE TREATMENT: YES  INFECTIONS: YES   LYMPHEDEMA ASSESSMENTS:   LANDMARK RIGHT  eval  At axilla    15 cm proximal to olecranon process   10 cm proximal to olecranon process   Olecranon process   15 cm proximal to ulnar styloid process   10 cm proximal to ulnar styloid process   Just proximal to ulnar styloid process   Across hand at thumb web space   At base of 2nd digit   (Blank rows = not tested)  LANDMARK LEFT  eval  At axilla    15 cm proximal to olecranon process    10 cm proximal to olecranon process   Olecranon process   15 cm proximal to ulnar styloid process   10 cm proximal to ulnar styloid process   Just proximal to ulnar styloid process   Across hand at thumb web space   At base of 2nd digit   (Blank rows = not tested)   FUNCTIONAL TESTS:    GAIT: Distance walked: front office to room 8  Assistive device utilized: Single point cane Level of assistance: Complete Independence     BREAST COMPLAINTS QUESTIONNAIRE(01/20/2024) Pain:6 Heaviness:0 Swollen feeling:8 Tense Skin:8 Redness:9 Bra Print:0 Size of Pores:0 Hard feeling: 10 Total:    41 /80 A Score over 9 indicates lymphedema issues in the breast BREAST COMPLAINTS QUESTIONNAIRE(02/29/2024) Pain:1 Heaviness:2 Swollen feeling:1 Tense Skin:1 Redness:2 Bra Print:0 Size of Pores:1 Hard feeling: 1 Total:   9  /80 A Score over 9 indicates lymphedema issues in the breast                                                                                                                         TREATMENT DATE:    03/15/2024 Mild redness still present in breasts  and slightly under breast, and healed redness noted in armpits as well In supine: Short neck, 5 diaphragmatic breaths, L axillary nodes and establishment of interaxillary pathway, L inguinal nodes and establishment of axilloinguinal pathway, then L breast lateral moving fluid towards pathway spending extra time in any areas of fibrosis then retracing all steps and then medial breast toward pathway and ending with LN's. Pt practiced all steps of Left breast with intermittent VC's and TC's/ Therapist then performed right breast MLD and had pt perform all steps with VC's and TC's prn. Also had pt try in Right SL, but was no easier for her to reach axillo-inguinal pathway. Gave pt typed instructions. 03/08/2024 2 rectangular foam pads made for lower border of bra and placed in TG soft Initiated Breast MLD In supine: Short neck, 5  diaphragmatic breaths, L axillary nodes and establishment of interaxillary pathway, L inguinal nodes and establishment of axilloinguinal pathway, then L breast lateral moving fluid towards pathway spending extra time in any areas of fibrosis then retracing all steps and then medial breast toward pathway and ending with LN's. Explained sequence, pressure and technique to pt while performing. Repeated on right breast with same technique. Discussed if pt would be comfortable with her sister learning MLD due to O2 and difficulty reaching, and will add chip packs next visit   02/29/2024 Assessed bilateral breasts and had Diannia Ruder, PT also check. In agreement OK to start MLD. Checked areas under breasts and feel it is fungal. Pt advised to try an anti fungal powder, and was given several telfa pads to put under breasts to prevent irritation. Showed pt lymphatics picture and explained superficial nature of technique and basic anatomy of lymphatics. Initiated MLD to left breast;short neck, 5 diaphragmatic breaths, left axillary and left inguinal LN's, left axillo-inguinal pathway, left lateral breast directing to axillo-inguinal pathway and retracing pathway and then left medial breast directing laterally and to axillo-inguinal  pathway retracing steps and then ending with left axillary and left inguinal LN's.  Repeated on the right as per the left. Pt was given a prescription to get a compression bra and she will get Dr. Lequita Halt to sign it so she may go to [facility] [employer] to get fit for a compression bra.   01/20/2024 Pts bilateral breasts very red,and warm and despite MD seeing her 2 days ago this therapist was concerned she may still have infection. Had Berna Spare PTA, and Dwaine Gale, PT both see pt and they were in agreement that she should not be treated at present. Messaged MD and let him know we were afraid we would be spreading infection, and asked if he would consider putting pt on Cipro or Keflex.  She has Eye Sx for cataracts scheduled on Feb 10, and 25th so she would not likely be able to come for PT until March. Made appt for Re-eval on 02/21/2024. Discussed importance of compression bra when infection clears and also asked MD to provide her with a script. Gave pt pictures of several bras and [facility] [employer]. Discussed how compression works and showed pt the wall picture of lymphatics and described that MLD is very gentle and that we will instruct her when it is safe to do so.   PATIENT EDUCATION:   02/29/2024:   Education details: Concerns of infection being spread if treated now, compression bra when infection cleared, gentle nature of MLD Person educated: Patient Education method: Explanation Education comprehension: verbalized understanding  HOME EXERCISE PROGRAM:   ASSESSMENT:  CLINICAL IMPRESSION:  Pt continues with mild redness at both breasts and medial fibrosis with swelling greatest on the left. She did well with 1st session performing MLD but requires further review. Instructions were printed for pt. May need chip pack bilateral medial breasts..    OBJECTIVE IMPAIRMENTS: decreased knowledge of condition, increased edema, postural dysfunction, and pain.   ACTIVITY LIMITATIONS: sleeping and hygiene/grooming  PARTICIPATION LIMITATIONS:  able to do most but with intermittent pain  PERSONAL FACTORS: 1-2 comorbidities: bilateral breast cancer, s/p radiation, Smoker  are also affecting patient's functional outcome.   REHAB POTENTIAL: Good  CLINICAL DECISION MAKING: Evolving/moderate complexity  EVALUATION COMPLEXITY: Moderate  GOALS: Goals reviewed with patient? Yes  SHORT TERM GOALS: Target date: 04/13/2024  Pt will purchase compression bra to decrease bilateral breast swelling Baseline: Goal status: MET 03/08/2024 2.  Pt will have decreased bilateral breast pain by 50% Baseline:  Goal status: INITIAL  3.  Pt will be  independent in MLD to bilateral  breasts to decrease swelling Baseline:  Goal status: INITIAL  4.  Breast complaints questionairre will be decreased to no greater than 20 to show improvements in swelling Baseline:  Goal status: INITIAL  :   PLAN:  PT FREQUENCY:1- 2x/week  PT DURATION: 12 weeks, however pt will not return to PT until March due to several eye surgeries  PLANNED INTERVENTIONS: 97164- PT Re-evaluation, 97110-Therapeutic exercises, 97530- Therapeutic activity, 97535- Self Care, 16109- Manual therapy, 97760- Orthotic Fit/training, and Patient/Family education  PLAN FOR NEXT SESSION:, make chip pack for medial breasts, continue instructing pt; only comes 1x per week.Check breast redness/swelling fibrosis, continue MLD  stress importance of compression, check under breasts area of redness/moistness  Waynette Buttery, PT 03/15/2024, 12:04 PM

## 2024-03-15 NOTE — Patient Instructions (Signed)
 Manual Lymph Drainage for Left Breast.  Do daily.  Do slowly. Use flat hands with just enough pressure to stretch the skin. Do not slide over the skin, but move the skin with the hand you're using. Lie down or sit comfortably (in a recliner, for example) to do this.  With right hand do circles at collar bones near  left neck 5-7 times (to "wake up" lots of lymph nodes in this area). Take slow deep breaths, allowing your belly to balloon out as your breathe in, 5x (to "wake up" abdominal lymph nodes to take on extra fluid). Left armpit--stretch skin in small circles to stimulate intact lymph nodes there, 5-7x. Left groin area, at panty line--stretch skin in small circles to stimulate lymph nodes 5-7x. Redirect fluid from left armpit toward left groin (cup your hand around the curve of your left side and do 3-4 "pumps" from armpit to groin) 3-4x down your side. Start at center of left breast  Do this in three rows to treat all of the upper outer breast tissue, and do each row 5 times, then, stretch from inner breast to outer breast in 3 rows atleast 5 times Then repeat #5 above.  End with repeating #3 and #4 above.   DO the Reverse for rRght Breast   [facility] [address] [place]   [id number] [phone]

## 2024-03-22 ENCOUNTER — Ambulatory Visit

## 2024-03-22 DIAGNOSIS — Z853 Personal history of malignant neoplasm of breast: Secondary | ICD-10-CM

## 2024-03-22 DIAGNOSIS — I890 Lymphedema, not elsewhere classified: Secondary | ICD-10-CM

## 2024-03-22 DIAGNOSIS — N61 Mastitis without abscess: Secondary | ICD-10-CM | POA: Diagnosis not present

## 2024-03-22 NOTE — Therapy (Signed)
 OUTPATIENT PHYSICAL THERAPY  UPPER EXTREMITY ONCOLOGY RE EVALUATION  Patient Name: Madison Medina MRN: [id number] DOB:1961-10-22, 63 y.o., female Today's Date: 03/22/2024  END OF SESSION:  PT End of Session - 03/22/24 0902     Visit Number 5    Number of Visits 12    Date for PT Re-Evaluation 04/11/24    Authorization Type [employer]    Authorization Time Period 3/18-04/19/2024 1 re-eval and 12 visits (Madonna extended date to include last scheduled visit)    Authorization - Visit Number 3    Authorization - Number of Visits 12    PT Start Time 0903    PT Stop Time 0955    PT Time Calculation (min) 52 min    Equipment Utilized During Treatment Oxygen    Activity Tolerance Patient tolerated treatment well    Behavior During Therapy [facility] for tasks assessed/performed              Past Medical History:  Diagnosis Date   AKI (acute kidney injury) (HCC) 12/30/2018   Anxiety 10/12/2016   Asthma    Back pain    CAD (coronary artery disease) 01/03/2019   Carotid artery occlusion    COPD (chronic obstructive pulmonary disease) (HCC)    CVA (cerebral vascular accident) (HCC) 12/30/2018   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   HTN (hypertension) 12/30/2018   Hyperlipidemia    Hypertension    Hyponatremia 12/30/2018   Hypothyroid 02/03/2016   Hypothyroidism    Nonobstructive atherosclerosis of coronary artery 08/03/2019   Stroke (HCC)    Tobacco use 07/09/2016   Past Surgical History:  Procedure Laterality Date   BREAST BIOPSY  12/2018   at breast center in [place]   CARDIAC CATHETERIZATION     CAROTID ENDARTERECTOMY     CARPAL TUNNEL RELEASE     CYST EXCISION     ENDARTERECTOMY Left 01/04/2019   Procedure: ENDARTERECTOMY CAROTID LEFT;  Surgeon: Nada Libman, MD;  Location: [facility] OR;  Service: Vascular;  Laterality: Left;   LEFT HEART CATH AND CORONARY ANGIOGRAPHY N/A 01/03/2019   Procedure: LEFT HEART CATH AND CORONARY ANGIOGRAPHY;  Surgeon: Lyn Records, MD;  Location:  [facility] INVASIVE CV LAB;  Service: Cardiovascular;  Laterality: N/A;   PATCH ANGIOPLASTY Left 01/04/2019   Procedure: PATCH ANGIOPLASTY USING Livia Snellen BIOLOGIC PATCH;  Surgeon: Nada Libman, MD;  Location: [facility] OR;  Service: Vascular;  Laterality: Left;   Patient Active Problem List   Diagnosis Date Noted   Stroke [facility])    Hypothyroidism    Hyperlipidemia    Carotid artery occlusion    Back pain    Asthma    Bilateral breast cancer (HCC) 11/22/2019   Nonobstructive atherosclerosis of coronary artery 08/03/2019   CAD (coronary artery disease) 01/03/2019   CVA (cerebral vascular accident) (HCC) 12/30/2018   HTN (hypertension) 12/30/2018   COPD (chronic obstructive pulmonary disease) (HCC) 12/30/2018   AKI (acute kidney injury) (HCC) 12/30/2018   Hyponatremia 12/30/2018   Breast cancer in female [facility]) 12/28/2018    Class: History of   Breast cancer of upper-outer quadrant of left female breast (HCC) 12/28/2018    Class: History of   Acute pharyngitis 10/29/2016   Essential hypertension 10/23/2016   Anxiety 10/12/2016   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   Tobacco use 07/09/2016   Sleep apnea 07/09/2016   Hypothyroid 02/03/2016   COPD exacerbation (HCC) 02/03/2016    PCP: Pershing Proud, FNP  REFERRING PROVIDER: Marvis Moeller, MD  REFERRING DIAG: Cellulitis of  left breast  THERAPY DIAG:  Cellulitis of breast  Lymphedema, not elsewhere classified  History of bilateral breast cancer  ONSET DATE: Jan 2025  Rationale for Evaluation and Treatment: Rehabilitation  SUBJECTIVE:                                                                                                                                                                                           SUBJECTIVE STATEMENT:  I tried the MLD but it is hard to reach to the sides to do the MLD. Redness is OK.  I still get areas under the breast that get red.     EVAL(01/20/2024):See history Antibiotic was  finished and breast redness gone after antibiotics in Nov/Dec but she developed redness and swelling in bilateral breasts after having a mammogram done in January 2025. She still has redness but MD feels she is not infected. Her breasts are tender but not painful. She feels firm areas in her breast. She does not have a compression bra.   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 This was followed by radiation, and Anastozole.Pt had ist episode of bilateral mastitis in 12/2022 which resolved. In November it returned and she was placed on antibiotics and had follow up on 11/26/ and 11/23/23 with mastitis showing improvement. She had improved and was doing well until her mammogram in January and she developed swelling in bilateral breasts the next day. Mammogram  did not show any abnormalities. She was seen on 01/18/2024 with improvement noted but still slightly red  PAIN:  Are you having pain? No NPRS scale:0/10 Pain location: bilateral breast greatest on the right Pain orientation: Bilateral  PAIN TYPE: sharp Pain description: intermittent  Aggravating factors: nothing just hits Relieving factors: tylenol  PRECAUTIONS: bilateral UE lymphedema risk, COPD, CVA , CAD, hypothyroidism  RED FLAGS: Bowel or bladder incontinence: Yes:      WEIGHT BEARING RESTRICTIONS: No  FALLS:  Has patient fallen in last 6 months? No  LIVING ENVIRONMENT: Lives with: lives with their spouse Lives in: Mobile home Stairs: No;  Has following equipment at home:   OCCUPATION: disabled  LEISURE: read, watch TV, some walking  HAND DOMINANCE: right   PRIOR LEVEL OF FUNCTION: Independent  PATIENT GOALS: Decrease swelling and tenderness   OBJECTIVE: Note: Objective measures were completed at Evaluation unless otherwise noted.  COGNITION: Overall cognitive status: Within functional limits for tasks assessed   PALPATION: Bilateral breasts warm to  touch  OBSERVATIONS / OTHER ASSESSMENTS:01/20/2024 Bilateral breast with significant redness but was seen by MD 2 days ago. Left breast with very small  skin tear underneath with redness and irritation. Fibrosis noted medal breast. Nipple very swollen, and retracted, Right breast also with redness, medial fibrosis, nipple swelling 02/29/2024 Decreased redness bilateral breast;fibrosis still noted medially bilaterally with swollen nipples, Very red and irritated under bilateral breast; appears to be very moist and appears to be fungal. Pt will try anti-fungal cream/powder and will ask MD if she requires something that is prescription     SENSATION: Light touch: Appears intact    POSTURE: forward head, rounded shoulders  UPPER EXTREMITY AROM/PROM:  A/PROM RIGHT   eval   Shoulder extension   Shoulder flexion 120  Shoulder abduction 113  Shoulder internal rotation   Shoulder external rotation     (Blank rows = not tested)  A/PROM LEFT   eval  Shoulder extension   Shoulder flexion 125  Shoulder abduction 115  Shoulder internal rotation   Shoulder external rotation     (Blank rows = not tested)  CERVICAL AROM: All within functional limits:      UPPER EXTREMITY STRENGTH:   LYMPHEDEMA ASSESSMENTS:   SURGERY TYPE/DATE: Bilateral lumpectomies 03/09/2019  NUMBER OF LYMPH NODES REMOVED: 0/1 Bilaterally  CHEMOTHERAPY: NO  RADIATION:YES  HORMONE TREATMENT: YES  INFECTIONS: YES   LYMPHEDEMA ASSESSMENTS:   LANDMARK RIGHT  eval  At axilla    15 cm proximal to olecranon process   10 cm proximal to olecranon process   Olecranon process   15 cm proximal to ulnar styloid process   10 cm proximal to ulnar styloid process   Just proximal to ulnar styloid process   Across hand at thumb web space   At base of 2nd digit   (Blank rows = not tested)  LANDMARK LEFT  eval  At axilla    15 cm proximal to olecranon process   10 cm proximal to olecranon process   Olecranon  process   15 cm proximal to ulnar styloid process   10 cm proximal to ulnar styloid process   Just proximal to ulnar styloid process   Across hand at thumb web space   At base of 2nd digit   (Blank rows = not tested)   FUNCTIONAL TESTS:    GAIT: Distance walked: front office to room 8  Assistive device utilized: Single point cane Level of assistance: Complete Independence     BREAST COMPLAINTS QUESTIONNAIRE(01/20/2024) Pain:6 Heaviness:0 Swollen feeling:8 Tense Skin:8 Redness:9 Bra Print:0 Size of Pores:0 Hard feeling: 10 Total:    41 /80 A Score over 9 indicates lymphedema issues in the breast BREAST COMPLAINTS QUESTIONNAIRE(02/29/2024) Pain:1 Heaviness:2 Swollen feeling:1 Tense Skin:1 Redness:2 Bra Print:0 Size of Pores:1 Hard feeling: 1 Total:   9  /80 A Score over 9 indicates lymphedema issues in the breast                                                                                                                         TREATMENT DATE:  03/22/2024 Breasts assessed; still mildly red, but no  pain. Still slightly irritated under breasts. Fibrosis not as noticeable in sitting, but firmness noted greatest bilateral medial breast greatest on left Made 2 long chip packs for inferior medial breast fibrosis bilaterally for pt to tuck into her bra In supine: Short neck, 5 diaphragmatic breaths, L axillary nodes and establishment of interaxillary pathway, L inguinal nodes and establishment of axilloinguinal pathway, then L breast lateral moving fluid towards pathway spending extra time in any areas of fibrosis then retracing all steps and then medial breast toward pathway and ending with LN's. Pt practiced briefly  steps of Left breast with intermittent VC's and TC's. She still has great difficulty reaching axillo-inguinal pathway. Will try in her bed at home but also discussed modifications prn to direct to axillary/pectoral LN's prn/ Therapist then performed right breast  MLD with verbal review to pt about modifications to prevent shoulder pain. Assisted pt with chip packs. Discussed pump with pt. [employer]) pt is interested in pursuing and is agreeable with benefits being sent. Sent today.  03/15/2024 Mild redness still present in breasts and slightly under breast, and healed redness noted in armpits as well In supine: Short neck, 5 diaphragmatic breaths, L axillary nodes and establishment of interaxillary pathway, L inguinal nodes and establishment of axilloinguinal pathway, then L breast lateral moving fluid towards pathway spending extra time in any areas of fibrosis then retracing all steps and then medial breast toward pathway and ending with LN's. Pt practiced all steps of Left breast with intermittent VC's and TC's/ Therapist then performed right breast MLD and had pt perform all steps with VC's and TC's prn. Also had pt try in Right SL, but was no easier for her to reach axillo-inguinal pathway. Gave pt typed instructions. 03/08/2024 2 rectangular foam pads made for lower border of bra and placed in TG soft Initiated Breast MLD In supine: Short neck, 5 diaphragmatic breaths, L axillary nodes and establishment of interaxillary pathway, L inguinal nodes and establishment of axilloinguinal pathway, then L breast lateral moving fluid towards pathway spending extra time in any areas of fibrosis then retracing all steps and then medial breast toward pathway and ending with LN's. Explained sequence, pressure and technique to pt while performing. Repeated on right breast with same technique. Discussed if pt would be comfortable with her sister learning MLD due to O2 and difficulty reaching, and will add chip packs next visit   02/29/2024 Assessed bilateral breasts and had Diannia Ruder, PT also check. In agreement OK to start MLD. Checked areas under breasts and feel it is fungal. Pt advised to try an anti fungal powder, and was given several telfa pads to put under breasts  to prevent irritation. Showed pt lymphatics picture and explained superficial nature of technique and basic anatomy of lymphatics. Initiated MLD to left breast;short neck, 5 diaphragmatic breaths, left axillary and left inguinal LN's, left axillo-inguinal pathway, left lateral breast directing to axillo-inguinal pathway and retracing pathway and then left medial breast directing laterally and to axillo-inguinal  pathway retracing steps and then ending with left axillary and left inguinal LN's.  Repeated on the right as per the left. Pt was given a prescription to get a compression bra and she will get Dr. Lequita Halt to sign it so she may go to [facility] [employer] to get fit for a compression bra.   01/20/2024 Pts bilateral breasts very red,and warm and despite MD seeing her 2 days ago this therapist was concerned she may still have infection. Had Berna Spare PTA, and  Dwaine Gale, PT both see pt and they were in agreement that she should not be treated at present. Messaged MD and let him know we were afraid we would be spreading infection, and asked if he would consider putting pt on Cipro or Keflex. She has Eye Sx for cataracts scheduled on Feb 10, and 25th so she would not likely be able to come for PT until March. Made appt for Re-eval on 02/21/2024. Discussed importance of compression bra when infection clears and also asked MD to provide her with a script. Gave pt pictures of several bras and [facility] [employer]. Discussed how compression works and showed pt the wall picture of lymphatics and described that MLD is very gentle and that we will instruct her when it is safe to do so.   PATIENT EDUCATION:   02/29/2024:   Education details: Concerns of infection being spread if treated now, compression bra when infection cleared, gentle nature of MLD Person educated: Patient Education method: Explanation Education comprehension: verbalized understanding  HOME EXERCISE  PROGRAM:   ASSESSMENT:  CLINICAL IMPRESSION:  Pt continues with mild redness at both breasts and medial fibrosis with swelling greatest on the left. She continues to have difficulty reaching axillo-inguinal pathways but will try at home on her bed. If she is unable showed her how to modify and direct to axillary and pectoral nodes. Gave permission to send demographics to [facility]   OBJECTIVE IMPAIRMENTS: decreased knowledge of condition, increased edema, postural dysfunction, and pain.   ACTIVITY LIMITATIONS: sleeping and hygiene/grooming  PARTICIPATION LIMITATIONS:  able to do most but with intermittent pain  PERSONAL FACTORS: 1-2 comorbidities: bilateral breast cancer, s/p radiation, Smoker  are also affecting patient's functional outcome.   REHAB POTENTIAL: Good  CLINICAL DECISION MAKING: Evolving/moderate complexity  EVALUATION COMPLEXITY: Moderate  GOALS: Goals reviewed with patient? Yes  SHORT TERM GOALS: Target date: 04/13/2024  Pt will purchase compression bra to decrease bilateral breast swelling Baseline: Goal status: MET 03/08/2024 2.  Pt will have decreased bilateral breast pain by 50% Baseline:  Goal status: MET 03/22/2024  3.  Pt will be  independent in MLD to bilateral breasts to decrease swelling Baseline:  Goal status: INITIAL  4.  Breast complaints questionairre will be decreased to no greater than 20 to show improvements in swelling Baseline:  Goal status: INITIAL  :   PLAN:  PT FREQUENCY:1- 2x/week  PT DURATION: 12 weeks, however pt will not return to PT until March due to several eye surgeries  PLANNED INTERVENTIONS: 97164- PT Re-evaluation, 97110-Therapeutic exercises, 97530- Therapeutic activity, 97535- Self Care, 16109- Manual therapy, 97760- Orthotic Fit/training, and Patient/Family education  PLAN FOR NEXT SESSION:, make chip pack for medial breasts, continue instructing pt; only comes 1x per week.Check breast redness/swelling fibrosis,  continue MLD  stress importance of compression, check under breasts area of redness/moistness. Sent info to [employer].  Waynette Buttery, PT 03/22/2024, 9:58 AM

## 2024-03-28 ENCOUNTER — Other Ambulatory Visit: Payer: Self-pay | Admitting: Cardiology

## 2024-03-28 DIAGNOSIS — I6523 Occlusion and stenosis of bilateral carotid arteries: Secondary | ICD-10-CM

## 2024-03-28 DIAGNOSIS — I2510 Atherosclerotic heart disease of native coronary artery without angina pectoris: Secondary | ICD-10-CM

## 2024-03-28 DIAGNOSIS — I10 Essential (primary) hypertension: Secondary | ICD-10-CM

## 2024-03-28 DIAGNOSIS — E7849 Other hyperlipidemia: Secondary | ICD-10-CM

## 2024-03-29 ENCOUNTER — Encounter: Payer: Self-pay | Admitting: Rehabilitation

## 2024-03-29 ENCOUNTER — Ambulatory Visit: Admitting: Rehabilitation

## 2024-03-29 DIAGNOSIS — N61 Mastitis without abscess: Secondary | ICD-10-CM | POA: Diagnosis not present

## 2024-03-29 DIAGNOSIS — I890 Lymphedema, not elsewhere classified: Secondary | ICD-10-CM

## 2024-03-29 DIAGNOSIS — Z853 Personal history of malignant neoplasm of breast: Secondary | ICD-10-CM

## 2024-03-29 NOTE — Therapy (Signed)
 OUTPATIENT PHYSICAL THERAPY  UPPER EXTREMITY ONCOLOGY RE EVALUATION  Patient Name: Madison Medina MRN: [id number] DOB:05/31/1961, 63 y.o., female Today's Date: 03/29/2024  END OF SESSION:  PT End of Session - 03/29/24 0857     Visit Number 6    Number of Visits 12    Date for PT Re-Evaluation 04/11/24    Authorization Time Period 3/18-04/19/2024 1 re-eval and 12 visits (Madonna extended date to include last scheduled visit)    Authorization - Visit Number 4    Authorization - Number of Visits 12    PT Start Time 0901    PT Stop Time 0951    PT Time Calculation (min) 50 min    Equipment Utilized During Treatment Oxygen    Activity Tolerance Patient tolerated treatment well    Behavior During Therapy [facility] for tasks assessed/performed              Past Medical History:  Diagnosis Date   AKI (acute kidney injury) (HCC) 12/30/2018   Anxiety 10/12/2016   Asthma    Back pain    CAD (coronary artery disease) 01/03/2019   Carotid artery occlusion    COPD (chronic obstructive pulmonary disease) (HCC)    CVA (cerebral vascular accident) (HCC) 12/30/2018   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   HTN (hypertension) 12/30/2018   Hyperlipidemia    Hypertension    Hyponatremia 12/30/2018   Hypothyroid 02/03/2016   Hypothyroidism    Nonobstructive atherosclerosis of coronary artery 08/03/2019   Stroke (HCC)    Tobacco use 07/09/2016   Past Surgical History:  Procedure Laterality Date   BREAST BIOPSY  12/2018   at breast [place]   CARDIAC CATHETERIZATION     CAROTID ENDARTERECTOMY     CARPAL TUNNEL RELEASE     CYST EXCISION     ENDARTERECTOMY Left 01/04/2019   Procedure: ENDARTERECTOMY CAROTID LEFT;  Surgeon: Nada Libman, MD;  Location: [facility] OR;  Service: Vascular;  Laterality: Left;   LEFT HEART CATH AND CORONARY ANGIOGRAPHY N/A 01/03/2019   Procedure: LEFT HEART CATH AND CORONARY ANGIOGRAPHY;  Surgeon: Lyn Records, MD;  Location: [facility] INVASIVE CV LAB;   Service: Cardiovascular;  Laterality: N/A;   PATCH ANGIOPLASTY Left 01/04/2019   Procedure: PATCH ANGIOPLASTY USING Livia Snellen BIOLOGIC PATCH;  Surgeon: Nada Libman, MD;  Location: [facility] OR;  Service: Vascular;  Laterality: Left;   Patient Active Problem List   Diagnosis Date Noted   Stroke [facility])    Hypothyroidism    Hyperlipidemia    Carotid artery occlusion    Back pain    Asthma    Bilateral breast cancer (HCC) 11/22/2019   Nonobstructive atherosclerosis of coronary artery 08/03/2019   CAD (coronary artery disease) 01/03/2019   CVA (cerebral vascular accident) (HCC) 12/30/2018   HTN (hypertension) 12/30/2018   COPD (chronic obstructive pulmonary disease) (HCC) 12/30/2018   AKI (acute kidney injury) (HCC) 12/30/2018   Hyponatremia 12/30/2018   Breast cancer in female [facility]) 12/28/2018    Class: History of   Breast cancer of upper-outer quadrant of left female breast (HCC) 12/28/2018    Class: History of   Acute pharyngitis 10/29/2016   Essential hypertension 10/23/2016   Anxiety 10/12/2016   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   Tobacco use 07/09/2016   Sleep apnea 07/09/2016   Hypothyroid 02/03/2016   COPD exacerbation (HCC) 02/03/2016    PCP: Pershing Proud, FNP  REFERRING PROVIDER: Marvis Moeller, MD  REFERRING DIAG: Cellulitis of left breast  THERAPY DIAG:  Cellulitis of breast  Lymphedema, not elsewhere classified  History of bilateral breast cancer  ONSET DATE: Jan 2025  Rationale for Evaluation and Treatment: Rehabilitation  SUBJECTIVE:                                                                                                                                                                                           SUBJECTIVE STATEMENT:  Nothing new  EVAL(01/20/2024):See history Antibiotic was finished and breast redness gone after antibiotics in Nov/Dec but she developed redness and swelling in bilateral breasts after having a mammogram  done in January 2025. She still has redness but MD feels she is not infected. Her breasts are tender but not painful. She feels firm areas in her breast. She does not have a compression bra.  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 This was followed by radiation, and Anastozole.Pt had ist episode of bilateral mastitis in 12/2022 which resolved. In November it returned and she was placed on antibiotics and had follow up on 11/26/ and 11/23/23 with mastitis showing improvement. She had improved and was doing well until her mammogram in January and she developed swelling in bilateral breasts the next day. Mammogram  did not show any abnormalities. She was seen on 01/18/2024 with improvement noted but still slightly red  PAIN:  Are you having pain? No NPRS scale:0/10 Pain location: bilateral breast greatest on the right Pain orientation: Bilateral  PAIN TYPE: sharp Pain description: intermittent  Aggravating factors: nothing just hits Relieving factors: tylenol  PRECAUTIONS: bilateral UE lymphedema risk, COPD, CVA , CAD, hypothyroidism  RED FLAGS: Bowel or bladder incontinence: Yes:      WEIGHT BEARING RESTRICTIONS: No  FALLS:  Has patient fallen in last 6 months? No  LIVING ENVIRONMENT: Lives with: lives with their spouse Lives in: Mobile home Stairs: No;  Has following equipment at home:   OCCUPATION: disabled  LEISURE: read, watch TV, some walking  HAND DOMINANCE: right   PRIOR LEVEL OF FUNCTION: Independent  PATIENT GOALS: Decrease swelling and tenderness   OBJECTIVE: Note: Objective measures were completed at Evaluation unless otherwise noted.  COGNITION: Overall cognitive status: Within functional limits for tasks assessed   PALPATION: Bilateral breasts warm to touch  OBSERVATIONS / OTHER ASSESSMENTS:01/20/2024 Bilateral breast with significant redness but was seen by MD 2 days ago. Left breast with very small  skin tear underneath with redness and irritation. Fibrosis noted medal breast. Nipple very swollen, and retracted, Right breast also with redness, medial fibrosis, nipple swelling 02/29/2024 Decreased redness bilateral breast;fibrosis still noted medially bilaterally with swollen nipples, Very red  and irritated under bilateral breast; appears to be very moist and appears to be fungal. Pt will try anti-fungal cream/powder and will ask MD if she requires something that is prescription  SENSATION: Light touch: Appears intact  POSTURE: forward head, rounded shoulders  UPPER EXTREMITY AROM/PROM:  A/PROM RIGHT   eval   Shoulder extension   Shoulder flexion 120  Shoulder abduction 113  Shoulder internal rotation   Shoulder external rotation     (Blank rows = not tested)  A/PROM LEFT   eval  Shoulder extension   Shoulder flexion 125  Shoulder abduction 115  Shoulder internal rotation   Shoulder external rotation     (Blank rows = not tested)  UPPER EXTREMITY STRENGTH:   LYMPHEDEMA ASSESSMENTS:  SURGERY TYPE/DATE: Bilateral lumpectomies 03/09/2019 NUMBER OF LYMPH NODES REMOVED: 0/1 Bilaterally CHEMOTHERAPY: NO RADIATION:YES HORMONE TREATMENT: YES INFECTIONS: YES  GAIT: Distance walked: front office to room 8  Assistive device utilized: Single point cane Level of assistance: Complete Independence  BREAST COMPLAINTS QUESTIONNAIRE(01/20/2024) Pain:6 Heaviness:0 Swollen feeling:8 Tense Skin:8 Redness:9 Bra Print:0 Size of Pores:0 Hard feeling: 10 Total:    41 /80 A Score over 9 indicates lymphedema issues in the breast  BREAST COMPLAINTS QUESTIONNAIRE(02/29/2024) Pain:1 Heaviness:2 Swollen feeling:1 Tense Skin:1 Redness:2 Bra Print:0 Size of Pores:1 Hard feeling: 1 Total:   9  /80 A Score over 9 indicates lymphedema issues in the breast                                                                                                                       TREATMENT  DATE:  03/29/2024 In supine: Short neck, 5 diaphragmatic breaths, L axillary nodes and L inguinal nodes and establishment of axilloinguinal pathway, then L breast lateral moving fluid towards pathway spending extra time in any areas of fibrosis then retracing all steps and then medial breast toward pathway and ending with LN's. then performed right breast MLD per the same. Suggested she try antifungal power in Lt axilla.   03/22/2024 Breasts assessed; still mildly red, but no pain. Still slightly irritated under breasts. Fibrosis not as noticeable in sitting, but firmness noted greatest bilateral medial breast greatest on left Made 2 long chip packs for inferior medial breast fibrosis bilaterally for pt to tuck into her bra In supine: Short neck, 5 diaphragmatic breaths, L axillary nodes and establishment of interaxillary pathway, L inguinal nodes and establishment of axilloinguinal pathway, then L breast lateral moving fluid towards pathway spending extra time in any areas of fibrosis then retracing all steps and then medial breast toward pathway and ending with LN's. Pt practiced briefly  steps of Left breast with intermittent VC's and TC's. She still has great difficulty reaching axillo-inguinal pathway. Will try in her bed at home but also discussed modifications prn to direct to axillary/pectoral LN's prn/ Therapist then performed right breast MLD with verbal review to pt about modifications to prevent shoulder pain. Assisted pt with chip packs. Discussed pump with pt. [facility]) pt is  interested in pursuing and is agreeable with benefits being sent. Sent today.  03/15/2024 Mild redness still present in breasts and slightly under breast, and healed redness noted in armpits as well In supine: Short neck, 5 diaphragmatic breaths, L axillary nodes and establishment of interaxillary pathway, L inguinal nodes and establishment of axilloinguinal pathway, then L breast lateral moving fluid towards pathway  spending extra time in any areas of fibrosis then retracing all steps and then medial breast toward pathway and ending with LN's. Pt practiced all steps of Left breast with intermittent VC's and TC's/ Therapist then performed right breast MLD and had pt perform all steps with VC's and TC's prn. Also had pt try in Right SL, but was no easier for her to reach axillo-inguinal pathway. Gave pt typed instructions. 03/08/2024 2 rectangular foam pads made for lower border of bra and placed in TG soft Initiated Breast MLD In supine: Short neck, 5 diaphragmatic breaths, L axillary nodes and establishment of interaxillary pathway, L inguinal nodes and establishment of axilloinguinal pathway, then L breast lateral moving fluid towards pathway spending extra time in any areas of fibrosis then retracing all steps and then medial breast toward pathway and ending with LN's. Explained sequence, pressure and technique to pt while performing. Repeated on right breast with same technique. Discussed if pt would be comfortable with her sister learning MLD due to O2 and difficulty reaching, and will add chip packs next visit   02/29/2024 Assessed bilateral breasts and had Deborrah Fam, PT also check. In agreement OK to start MLD. Checked areas under breasts and feel it is fungal. Pt advised to try an anti fungal powder, and was given several telfa pads to put under breasts to prevent irritation. Showed pt lymphatics picture and explained superficial nature of technique and basic anatomy of lymphatics. Initiated MLD to left breast;short neck, 5 diaphragmatic breaths, left axillary and left inguinal LN's, left axillo-inguinal pathway, left lateral breast directing to axillo-inguinal pathway and retracing pathway and then left medial breast directing laterally and to axillo-inguinal  pathway retracing steps and then ending with left axillary and left inguinal LN's.  Repeated on the right as per the left. Pt was given a prescription to  get a compression bra and she will get Dr. Britta Candy to sign it so she may go to [employer] to get fit for a compression bra.   01/20/2024 Pts bilateral breasts very red,and warm and despite MD seeing her 2 days ago this therapist was concerned she may still have infection. Had Roslynn Coombes PTA, and Arnette Bias, PT both see pt and they were in agreement that she should not be treated at present. Messaged MD and let him know we were afraid we would be spreading infection, and asked if he would consider putting pt on Cipro or Keflex. She has Eye Sx for cataracts scheduled on Feb 10, and 25th so she would not likely be able to come for PT until March. Made appt for Re-eval on 02/21/2024. Discussed importance of compression bra when infection clears and also asked MD to provide her with a script. Gave pt pictures of several bras and [employer] [facility] [employer]. Discussed how compression works and showed pt the wall picture of lymphatics and described that MLD is very gentle and that we will instruct her when it is safe to do so.   PATIENT EDUCATION:   02/29/2024:   Education details: Concerns of infection being spread if treated now, compression bra when infection cleared, gentle  nature of MLD Person educated: Patient Education method: Explanation Education comprehension: verbalized understanding  HOME EXERCISE PROGRAM:   ASSESSMENT:  CLINICAL IMPRESSION: Pt continues with mild redness at both breasts and medial fibrosis with swelling greatest on the left. Continued bil MLD.   OBJECTIVE IMPAIRMENTS: decreased knowledge of condition, increased edema, postural dysfunction, and pain.   ACTIVITY LIMITATIONS: sleeping and hygiene/grooming  PARTICIPATION LIMITATIONS:  able to do most but with intermittent pain  PERSONAL FACTORS: 1-2 comorbidities: bilateral breast cancer, s/p radiation, Smoker  are also affecting patient's functional outcome.   REHAB POTENTIAL: Good  CLINICAL DECISION MAKING:  Evolving/moderate complexity  EVALUATION COMPLEXITY: Moderate  GOALS: Goals reviewed with patient? Yes  SHORT TERM GOALS: Target date: 04/13/2024  Pt will purchase compression bra to decrease bilateral breast swelling Baseline: Goal status: MET 03/08/2024 2.  Pt will have decreased bilateral breast pain by 50% Baseline:  Goal status: MET 03/22/2024  3.  Pt will be  independent in MLD to bilateral breasts to decrease swelling Baseline:  Goal status: INITIAL  4.  Breast complaints questionairre will be decreased to no greater than 20 to show improvements in swelling Baseline:  Goal status: INITIAL  :   PLAN:  PT FREQUENCY:1- 2x/week  PT DURATION: 12 weeks, however pt will not return to PT until March due to several eye surgeries  PLANNED INTERVENTIONS: 97164- PT Re-evaluation, 97110-Therapeutic exercises, 97530- Therapeutic activity, 97535- Self Care, 16109- Manual therapy, 97760- Orthotic Fit/training, and Patient/Family education  PLAN FOR NEXT SESSION:, make chip pack for medial breasts, continue instructing pt; only comes 1x per week.Check breast redness/swelling fibrosis, continue MLD  stress importance of compression, check under breasts area of redness/moistness. Sent info to [employer].  Encarnacion Harris, PT 03/29/2024, 9:51 AM

## 2024-04-05 ENCOUNTER — Ambulatory Visit

## 2024-04-05 DIAGNOSIS — Z853 Personal history of malignant neoplasm of breast: Secondary | ICD-10-CM

## 2024-04-05 DIAGNOSIS — I890 Lymphedema, not elsewhere classified: Secondary | ICD-10-CM

## 2024-04-05 DIAGNOSIS — N61 Mastitis without abscess: Secondary | ICD-10-CM | POA: Diagnosis not present

## 2024-04-05 NOTE — Therapy (Signed)
 OUTPATIENT PHYSICAL THERAPY  UPPER EXTREMITY ONCOLOGY TREATMENT  Patient Name: Madison Medina MRN: [id number] DOB:[birth year], 63 y.o., female Today's Date: 04/05/2024  END OF SESSION:  PT End of Session - 04/05/24 1108     Visit Number 7    Number of Visits 12    Date for PT Re-Evaluation 04/11/24    Authorization Time Period 3/18-04/19/2024 1 re-eval and 12 visits (Madonna extended date to include last scheduled visit)    Authorization - Visit Number 5    Authorization - Number of Visits 12    PT Start Time 1105    PT Stop Time 1200    PT Time Calculation (min) 55 min    Activity Tolerance Patient tolerated treatment well    Behavior During Therapy [facility] for tasks assessed/performed              Past Medical History:  Diagnosis Date   AKI (acute kidney injury) (HCC) 12/30/2018   Anxiety 10/12/2016   Asthma    Back pain    CAD (coronary artery disease) 01/03/2019   Carotid artery occlusion    COPD (chronic obstructive pulmonary disease) (HCC)    CVA (cerebral vascular accident) (HCC) 12/30/2018   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   HTN (hypertension) 12/30/2018   Hyperlipidemia    Hypertension    Hyponatremia 12/30/2018   Hypothyroid 02/03/2016   Hypothyroidism    Nonobstructive atherosclerosis of coronary artery 08/03/2019   Stroke (HCC)    Tobacco use 07/09/2016   Past Surgical History:  Procedure Laterality Date   BREAST BIOPSY  12/2018   at breast center in [place]   CARDIAC CATHETERIZATION     CAROTID ENDARTERECTOMY     CARPAL TUNNEL RELEASE     CYST EXCISION     ENDARTERECTOMY Left 01/04/2019   Procedure: ENDARTERECTOMY CAROTID LEFT;  Surgeon: Margherita Shell, MD;  Location: [facility] OR;  Service: Vascular;  Laterality: Left;   LEFT HEART CATH AND CORONARY ANGIOGRAPHY N/A 01/03/2019   Procedure: LEFT HEART CATH AND CORONARY ANGIOGRAPHY;  Surgeon: Arty Binning, MD;  Location: [facility] INVASIVE CV LAB;  Service: Cardiovascular;  Laterality: N/A;   PATCH  ANGIOPLASTY Left 01/04/2019   Procedure: PATCH ANGIOPLASTY USING Corinna Dickens BIOLOGIC PATCH;  Surgeon: Margherita Shell, MD;  Location: [facility] OR;  Service: Vascular;  Laterality: Left;   Patient Active Problem List   Diagnosis Date Noted   Stroke [facility])    Hypothyroidism    Hyperlipidemia    Carotid artery occlusion    Back pain    Asthma    Bilateral breast cancer (HCC) 11/22/2019   Nonobstructive atherosclerosis of coronary artery 08/03/2019   CAD (coronary artery disease) 01/03/2019   CVA (cerebral vascular accident) (HCC) 12/30/2018   HTN (hypertension) 12/30/2018   COPD (chronic obstructive pulmonary disease) (HCC) 12/30/2018   AKI (acute kidney injury) (HCC) 12/30/2018   Hyponatremia 12/30/2018   Breast cancer in female [facility]) 12/28/2018    Class: History of   Breast cancer of upper-outer quadrant of left female breast (HCC) 12/28/2018    Class: History of   Acute pharyngitis 10/29/2016   Essential hypertension 10/23/2016   Anxiety 10/12/2016   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   Tobacco use 07/09/2016   Sleep apnea 07/09/2016   Hypothyroid 02/03/2016   COPD exacerbation (HCC) 02/03/2016    PCP: Alden Amato, FNP  REFERRING PROVIDER: Nonda Bays, MD  REFERRING DIAG: Cellulitis of left breast  THERAPY DIAG:  Cellulitis of breast  Lymphedema, not elsewhere classified  History of bilateral breast cancer  ONSET DATE: Jan 2025  Rationale for Evaluation and Treatment: Rehabilitation  SUBJECTIVE:                                                                                                                                                                                           SUBJECTIVE STATEMENT:  The pump company called me and they are working on checking my benefits with my insurance, then they'll make the appt to come to my house. My breasts still get red intermittently and the Lt one is still firm at the bottom of my breast. The redness is worse  when my breast swell worse.   EVAL(01/20/2024):See history Antibiotic was finished and breast redness gone after antibiotics in Nov/Dec but she developed redness and swelling in bilateral breasts after having a mammogram done in January 2025. She still has redness but MD feels she is not infected. Her breasts are tender but not painful. She feels firm areas in her breast. She does not have a compression bra.  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 This was followed by radiation, and Anastozole.Pt had ist episode of bilateral mastitis in 12/2022 which resolved. In November it returned and she was placed on antibiotics and had follow up on 11/26/ and 11/23/23 with mastitis showing improvement. She had improved and was doing well until her mammogram in January and she developed swelling in bilateral breasts the next day. Mammogram  did not show any abnormalities. She was seen on 01/18/2024 with improvement noted but still slightly red  PAIN:  Are you having pain? No   PRECAUTIONS: bilateral UE lymphedema risk, COPD, CVA , CAD, hypothyroidism  RED FLAGS: Bowel or bladder incontinence: Yes:      WEIGHT BEARING RESTRICTIONS: No  FALLS:  Has patient fallen in last 6 months? No  LIVING ENVIRONMENT: Lives with: lives with their spouse Lives in: Mobile home Stairs: No;  Has following equipment at home:   OCCUPATION: disabled  LEISURE: read, watch TV, some walking  HAND DOMINANCE: right   PRIOR LEVEL OF FUNCTION: Independent  PATIENT GOALS: Decrease swelling and tenderness   OBJECTIVE: Note: Objective measures were completed at Evaluation unless otherwise noted.  COGNITION: Overall cognitive status: Within functional limits for tasks assessed   PALPATION: Bilateral breasts warm to touch  OBSERVATIONS / OTHER ASSESSMENTS:01/20/2024 Bilateral breast with significant redness but was seen by MD 2 days ago. Left breast with very small  skin tear underneath with redness and irritation. Fibrosis noted medal breast. Nipple very swollen, and retracted, Right breast also with redness, medial fibrosis,  nipple swelling 02/29/2024 Decreased redness bilateral breast;fibrosis still noted medially bilaterally with swollen nipples, Very red and irritated under bilateral breast; appears to be very moist and appears to be fungal. Pt will try anti-fungal cream/powder and will ask MD if she requires something that is prescription  SENSATION: Light touch: Appears intact  POSTURE: forward head, rounded shoulders  UPPER EXTREMITY AROM/PROM:  A/PROM RIGHT   eval   Shoulder extension   Shoulder flexion 120  Shoulder abduction 113  Shoulder internal rotation   Shoulder external rotation     (Blank rows = not tested)  A/PROM LEFT   eval  Shoulder extension   Shoulder flexion 125  Shoulder abduction 115  Shoulder internal rotation   Shoulder external rotation     (Blank rows = not tested)  UPPER EXTREMITY STRENGTH:   LYMPHEDEMA ASSESSMENTS:  SURGERY TYPE/DATE: Bilateral lumpectomies 03/09/2019 NUMBER OF LYMPH NODES REMOVED: 0/1 Bilaterally CHEMOTHERAPY: NO RADIATION:YES HORMONE TREATMENT: YES INFECTIONS: YES  GAIT: Distance walked: front office to room 8  Assistive device utilized: Single point cane Level of assistance: Complete Independence  BREAST COMPLAINTS QUESTIONNAIRE(01/20/2024) Pain:6 Heaviness:0 Swollen feeling:8 Tense Skin:8 Redness:9 Bra Print:0 Size of Pores:0 Hard feeling: 10 Total:    41 /80 A Score over 9 indicates lymphedema issues in the breast  BREAST COMPLAINTS QUESTIONNAIRE(02/29/2024) Pain:1 Heaviness:2 Swollen feeling:1 Tense Skin:1 Redness:2 Bra Print:0 Size of Pores:1 Hard feeling: 1 Total:   9  /80 A Score over 9 indicates lymphedema issues in the breast                                                                                                                       TREATMENT  DATE:  04/05/24: Manual Therapy In supine: Short neck, 5 diaphragmatic breaths, L inguinal nodes and establishment of Lt axilloinguinal pathway, then L breast lateral moving fluid towards pathway spending extra time in any areas of fibrosis then retracing all steps and then medial breast toward pathway, then into Rt S/L for further focus to lateral breast and ending in supine with inguinal LN's. Then performed right breast MLD per the same and included time spent in Lt S/L and ended in supine at LN's.   03/29/2024 In supine: Short neck, 5 diaphragmatic breaths, L axillary nodes and L inguinal nodes and establishment of axilloinguinal pathway, then L breast lateral moving fluid towards pathway spending extra time in any areas of fibrosis then retracing all steps and then medial breast toward pathway and ending with LN's. then performed right breast MLD per the same. Suggested she try antifungal power in Lt axilla.   03/22/2024 Breasts assessed; still mildly red, but no pain. Still slightly irritated under breasts. Fibrosis not as noticeable in sitting, but firmness noted greatest bilateral medial breast greatest on left Made 2 long chip packs for inferior medial breast fibrosis bilaterally for pt to tuck into her bra In supine: Short neck, 5 diaphragmatic breaths, L axillary nodes and establishment of interaxillary pathway, L inguinal nodes and establishment of  axilloinguinal pathway, then L breast lateral moving fluid towards pathway spending extra time in any areas of fibrosis then retracing all steps and then medial breast toward pathway and ending with LN's. Pt practiced briefly  steps of Left breast with intermittent VC's and TC's. She still has great difficulty reaching axillo-inguinal pathway. Will try in her bed at home but also discussed modifications prn to direct to axillary/pectoral LN's prn/ Therapist then performed right breast MLD with verbal review to pt about modifications to prevent shoulder  pain. Assisted pt with chip packs. Discussed pump with pt. [facility]) pt is interested in pursuing and is agreeable with benefits being sent. Sent today.  03/15/2024 Mild redness still present in breasts and slightly under breast, and healed redness noted in armpits as well In supine: Short neck, 5 diaphragmatic breaths, L axillary nodes and establishment of interaxillary pathway, L inguinal nodes and establishment of axilloinguinal pathway, then L breast lateral moving fluid towards pathway spending extra time in any areas of fibrosis then retracing all steps and then medial breast toward pathway and ending with LN's. Pt practiced all steps of Left breast with intermittent VC's and TC's/ Therapist then performed right breast MLD and had pt perform all steps with VC's and TC's prn. Also had pt try in Right SL, but was no easier for her to reach axillo-inguinal pathway. Gave pt typed instructions. 03/08/2024 2 rectangular foam pads made for lower border of bra and placed in TG soft Initiated Breast MLD In supine: Short neck, 5 diaphragmatic breaths, L axillary nodes and establishment of interaxillary pathway, L inguinal nodes and establishment of axilloinguinal pathway, then L breast lateral moving fluid towards pathway spending extra time in any areas of fibrosis then retracing all steps and then medial breast toward pathway and ending with LN's. Explained sequence, pressure and technique to pt while performing. Repeated on right breast with same technique. Discussed if pt would be comfortable with her sister learning MLD due to O2 and difficulty reaching, and will add chip packs next visit   02/29/2024 Assessed bilateral breasts and had Deborrah Fam, PT also check. In agreement OK to start MLD. Checked areas under breasts and feel it is fungal. Pt advised to try an anti fungal powder, and was given several telfa pads to put under breasts to prevent irritation. Showed pt lymphatics picture and explained  superficial nature of technique and basic anatomy of lymphatics. Initiated MLD to left breast;short neck, 5 diaphragmatic breaths, left axillary and left inguinal LN's, left axillo-inguinal pathway, left lateral breast directing to axillo-inguinal pathway and retracing pathway and then left medial breast directing laterally and to axillo-inguinal  pathway retracing steps and then ending with left axillary and left inguinal LN's.  Repeated on the right as per the left. Pt was given a prescription to get a compression bra and she will get Dr. Britta Candy to sign it so she may go to [employer] to get fit for a compression bra.   01/20/2024 Pts bilateral breasts very red,and warm and despite MD seeing her 2 days ago this therapist was concerned she may still have infection. Had Roslynn Coombes PTA, and Arnette Bias, PT both see pt and they were in agreement that she should not be treated at present. Messaged MD and let him know we were afraid we would be spreading infection, and asked if he would consider putting pt on Cipro or Keflex. She has Eye Sx for cataracts scheduled on Feb 10, and 25th so she would not  likely be able to come for PT until March. Made appt for Re-eval on 02/21/2024. Discussed importance of compression bra when infection clears and also asked MD to provide her with a script. Gave pt pictures of several bras and [employer]. Discussed how compression works and showed pt the wall picture of lymphatics and described that MLD is very gentle and that we will instruct her when it is safe to do so.   PATIENT EDUCATION:   02/29/2024:   Education details: Concerns of infection being spread if treated now, compression bra when infection cleared, gentle nature of MLD Person educated: Patient Education method: Explanation Education comprehension: verbalized understanding  HOME EXERCISE PROGRAM:   ASSESSMENT:  CLINICAL IMPRESSION: Continued with MLD to bil breasts. Had pt in  S/L for further focus to lateral breasts and redirecting towards anastomosis. Pt is wearing her compression foam and reports has heard from the pump company and they are verifying her benefits.   OBJECTIVE IMPAIRMENTS: decreased knowledge of condition, increased edema, postural dysfunction, and pain.   ACTIVITY LIMITATIONS: sleeping and hygiene/grooming  PARTICIPATION LIMITATIONS:  able to do most but with intermittent pain  PERSONAL FACTORS: 1-2 comorbidities: bilateral breast cancer, s/p radiation, Smoker  are also affecting patient's functional outcome.   REHAB POTENTIAL: Good  CLINICAL DECISION MAKING: Evolving/moderate complexity  EVALUATION COMPLEXITY: Moderate  GOALS: Goals reviewed with patient? Yes  SHORT TERM GOALS: Target date: 04/13/2024  Pt will purchase compression bra to decrease bilateral breast swelling Baseline: Goal status: MET 03/08/2024 2.  Pt will have decreased bilateral breast pain by 50% Baseline:  Goal status: MET 03/22/2024  3.  Pt will be  independent in MLD to bilateral breasts to decrease swelling Baseline:  Goal status: INITIAL  4.  Breast complaints questionairre will be decreased to no greater than 20 to show improvements in swelling Baseline:  Goal status: INITIAL  :   PLAN:  PT FREQUENCY:1- 2x/week  PT DURATION: 12 weeks, however pt will not return to PT until March due to several eye surgeries  PLANNED INTERVENTIONS: 97164- PT Re-evaluation, 97110-Therapeutic exercises, 97530- Therapeutic activity, 97535- Self Care, 09811- Manual therapy, 97760- Orthotic Fit/training, and Patient/Family education  PLAN FOR NEXT SESSION:,Continue instructing pt; only comes 1x per week.Check breast redness/swelling fibrosis, continue MLD  stress importance of compression, check under breasts area of redness/moistness. Sent info to [employer].  Denyce Flank, PTA 04/05/2024, 12:05 PM

## 2024-04-12 ENCOUNTER — Ambulatory Visit

## 2024-04-12 DIAGNOSIS — I890 Lymphedema, not elsewhere classified: Secondary | ICD-10-CM

## 2024-04-12 DIAGNOSIS — N61 Mastitis without abscess: Secondary | ICD-10-CM

## 2024-04-12 DIAGNOSIS — Z853 Personal history of malignant neoplasm of breast: Secondary | ICD-10-CM

## 2024-04-12 NOTE — Therapy (Signed)
 OUTPATIENT PHYSICAL THERAPY  UPPER EXTREMITY ONCOLOGY TREATMENT  Patient Name: Madison Medina MRN: [id number] DOB:1961/01/08, 63 y.o., female Today's Date: 04/12/2024  END OF SESSION:  PT End of Session - 04/12/24 1159     Visit Number 7    Number of Visits 12    Date for PT Re-Evaluation 04/19/24    Authorization Type [employer]    Authorization Time Period 3/18-04/19/2024 1 re-eval and 12 visits (Madonna extended date to include last scheduled visit)    Authorization - Visit Number 6    Authorization - Number of Visits 12    PT Start Time 1200    PT Stop Time 1250    PT Time Calculation (min) 50 min    Activity Tolerance Patient tolerated treatment well    Behavior During Therapy [facility] for tasks assessed/performed               Past Medical History:  Diagnosis Date   AKI (acute kidney injury) (HCC) 12/30/2018   Anxiety 10/12/2016   Asthma    Back pain    CAD (coronary artery disease) 01/03/2019   Carotid artery occlusion    COPD (chronic obstructive pulmonary disease) (HCC)    CVA (cerebral vascular accident) (HCC) 12/30/2018   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   HTN (hypertension) 12/30/2018   Hyperlipidemia    Hypertension    Hyponatremia 12/30/2018   Hypothyroid 02/03/2016   Hypothyroidism    Nonobstructive atherosclerosis of coronary artery 08/03/2019   Stroke (HCC)    Tobacco use 07/09/2016   Past Surgical History:  Procedure Laterality Date   BREAST BIOPSY  12/2018   at breast center in [place]   CARDIAC CATHETERIZATION     CAROTID ENDARTERECTOMY     CARPAL TUNNEL RELEASE     CYST EXCISION     ENDARTERECTOMY Left 01/04/2019   Procedure: ENDARTERECTOMY CAROTID LEFT;  Surgeon: Margherita Shell, MD;  Location: [facility] OR;  Service: Vascular;  Laterality: Left;   LEFT HEART CATH AND CORONARY ANGIOGRAPHY N/A 01/03/2019   Procedure: LEFT HEART CATH AND CORONARY ANGIOGRAPHY;  Surgeon: Arty Binning, MD;  Location: [facility] INVASIVE CV LAB;  Service: Cardiovascular;   Laterality: N/A;   PATCH ANGIOPLASTY Left 01/04/2019   Procedure: PATCH ANGIOPLASTY USING Corinna Dickens BIOLOGIC PATCH;  Surgeon: Margherita Shell, MD;  Location: [facility] OR;  Service: Vascular;  Laterality: Left;   Patient Active Problem List   Diagnosis Date Noted   Stroke [facility])    Hypothyroidism    Hyperlipidemia    Carotid artery occlusion    Back pain    Asthma    Bilateral breast cancer (HCC) 11/22/2019   Nonobstructive atherosclerosis of coronary artery 08/03/2019   CAD (coronary artery disease) 01/03/2019   CVA (cerebral vascular accident) (HCC) 12/30/2018   HTN (hypertension) 12/30/2018   COPD (chronic obstructive pulmonary disease) (HCC) 12/30/2018   AKI (acute kidney injury) (HCC) 12/30/2018   Hyponatremia 12/30/2018   Breast cancer in female [facility]) 12/28/2018    Class: History of   Breast cancer of upper-outer quadrant of left female breast (HCC) 12/28/2018    Class: History of   Acute pharyngitis 10/29/2016   Essential hypertension 10/23/2016   Anxiety 10/12/2016   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   Tobacco use 07/09/2016   Sleep apnea 07/09/2016   Hypothyroid 02/03/2016   COPD exacerbation (HCC) 02/03/2016    PCP: Alden Amato, FNP  REFERRING PROVIDER: Nonda Bays, MD  REFERRING DIAG: Cellulitis of left breast  THERAPY DIAG:  Cellulitis of  breast  Lymphedema, not elsewhere classified  History of bilateral breast cancer  ONSET DATE: Jan 2025  Rationale for Evaluation and Treatment: Rehabilitation  SUBJECTIVE:                                                                                                                                                                                           SUBJECTIVE STATEMENT:   Breasts are doing better, but they are still getting hard at the medial side. I haven't had any pain. I still get some of the redness, but the swelling is probably 70% better than when I first started. I have tried doing the MLD but  it is really hard to reach..  EVAL(01/20/2024):See history Antibiotic was finished and breast redness gone after antibiotics in Nov/Dec but she developed redness and swelling in bilateral breasts after having a mammogram done in January 2025. She still has redness but MD feels she is not infected. Her breasts are tender but not painful. She feels firm areas in her breast. She does not have a compression bra.  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 This was followed by radiation, and Anastozole.Pt had ist episode of bilateral mastitis in 12/2022 which resolved. In November it returned and she was placed on antibiotics and had follow up on 11/26/ and 11/23/23 with mastitis showing improvement. She had improved and was doing well until her mammogram in January and she developed swelling in bilateral breasts the next day. Mammogram  did not show any abnormalities. She was seen on 01/18/2024 with improvement noted but still slightly red  PAIN:  Are you having pain? No   PRECAUTIONS: bilateral UE lymphedema risk, COPD, CVA , CAD, hypothyroidism  RED FLAGS: Bowel or bladder incontinence: Yes:      WEIGHT BEARING RESTRICTIONS: No  FALLS:  Has patient fallen in last 6 months? No  LIVING ENVIRONMENT: Lives with: lives with their spouse Lives in: Mobile home Stairs: No;  Has following equipment at home:   OCCUPATION: disabled  LEISURE: read, watch TV, some walking  HAND DOMINANCE: right   PRIOR LEVEL OF FUNCTION: Independent  PATIENT GOALS: Decrease swelling and tenderness   OBJECTIVE: Note: Objective measures were completed at Evaluation unless otherwise noted.  COGNITION: Overall cognitive status: Within functional limits for tasks assessed   PALPATION: Bilateral breasts warm to touch  OBSERVATIONS / OTHER ASSESSMENTS:01/20/2024 Bilateral breast with significant redness but was seen by MD 2 days ago. Left breast with very small  skin tear underneath with redness and irritation. Fibrosis noted medal breast. Nipple very swollen, and retracted, Right breast also  with redness, medial fibrosis, nipple swelling 02/29/2024 Decreased redness bilateral breast;fibrosis still noted medially bilaterally with swollen nipples, Very red and irritated under bilateral breast; appears to be very moist and appears to be fungal. Pt will try anti-fungal cream/powder and will ask MD if she requires something that is prescription  SENSATION: Light touch: Appears intact  POSTURE: forward head, rounded shoulders  UPPER EXTREMITY AROM/PROM:  A/PROM RIGHT   eval   Shoulder extension   Shoulder flexion 120  Shoulder abduction 113  Shoulder internal rotation   Shoulder external rotation     (Blank rows = not tested)  A/PROM LEFT   eval  Shoulder extension   Shoulder flexion 125  Shoulder abduction 115  Shoulder internal rotation   Shoulder external rotation     (Blank rows = not tested)  UPPER EXTREMITY STRENGTH:   LYMPHEDEMA ASSESSMENTS:  SURGERY TYPE/DATE: Bilateral lumpectomies 03/09/2019 NUMBER OF LYMPH NODES REMOVED: 0/1 Bilaterally CHEMOTHERAPY: NO RADIATION:YES HORMONE TREATMENT: YES INFECTIONS: YES  GAIT: Distance walked: front office to room 8  Assistive device utilized: Single point cane Level of assistance: Complete Independence  BREAST COMPLAINTS QUESTIONNAIRE(01/20/2024) Pain:6 Heaviness:0 Swollen feeling:8 Tense Skin:8 Redness:9 Bra Print:0 Size of Pores:0 Hard feeling: 10 Total:    41 /80 A Score over 9 indicates lymphedema issues in the breast  BREAST COMPLAINTS QUESTIONNAIRE(02/29/2024) Pain:1 Heaviness:2 Swollen feeling:1 Tense Skin:1 Redness:2 Bra Print:0 Size of Pores:1 Hard feeling: 1 Total:   9  /80 A Score over 9 indicates lymphedema issues in the breast                                                                                                                       TREATMENT  DATE:  04/12/2024 Manual Therapy Pt continues with medial fibrosis, left greater than right, and redness under left breast greater than right and left axillary region. Pt treating with anti fungal powder In supine: Short neck, 5 diaphragmatic breaths, L axillary nodes and establishment of interaxillary pathway, L inguinal nodes and establishment of axilloinguinal pathway, then L breast lateral moving fluid towards pathway spending extra time in any areas of fibrosis then retracing all steps and then medial breast toward pathway and ending with LN's. Repeated on right breast with same technique. Briefly explained to pt how to do MLD to axillary/pectoral sternal nodes as needed due to being unable to reach her opposite side. 04/05/24: Manual Therapy In supine: Short neck, 5 diaphragmatic breaths, L inguinal nodes and establishment of Lt axilloinguinal pathway, then L breast lateral moving fluid towards pathway spending extra time in any areas of fibrosis then retracing all steps and then medial breast toward pathway, then into Rt S/L for further focus to lateral breast and ending in supine with inguinal LN's. Then performed right breast MLD per the same and included time spent in Lt S/L and ended in supine at LN's.   03/29/2024 In supine: Short neck, 5 diaphragmatic breaths, L axillary nodes and L inguinal nodes and establishment of axilloinguinal pathway,  then L breast lateral moving fluid towards pathway spending extra time in any areas of fibrosis then retracing all steps and then medial breast toward pathway and ending with LN's. then performed right breast MLD per the same. Suggested she try antifungal power in Lt axilla.   03/22/2024 Breasts assessed; still mildly red, but no pain. Still slightly irritated under breasts. Fibrosis not as noticeable in sitting, but firmness noted greatest bilateral medial breast greatest on left Made 2 long chip packs for inferior medial breast fibrosis bilaterally for  pt to tuck into her bra In supine: Short neck, 5 diaphragmatic breaths, L axillary nodes and establishment of interaxillary pathway, L inguinal nodes and establishment of axilloinguinal pathway, then L breast lateral moving fluid towards pathway spending extra time in any areas of fibrosis then retracing all steps and then medial breast toward pathway and ending with LN's. Pt practiced briefly  steps of Left breast with intermittent VC's and TC's. She still has great difficulty reaching axillo-inguinal pathway. Will try in her bed at home but also discussed modifications prn to direct to axillary/pectoral LN's prn/ Therapist then performed right breast MLD with verbal review to pt about modifications to prevent shoulder pain. Assisted pt with chip packs. Discussed pump with pt. [facility]) pt is interested in pursuing and is agreeable with benefits being sent. Sent today.  03/15/2024 Mild redness still present in breasts and slightly under breast, and healed redness noted in armpits as well In supine: Short neck, 5 diaphragmatic breaths, L axillary nodes and establishment of interaxillary pathway, L inguinal nodes and establishment of axilloinguinal pathway, then L breast lateral moving fluid towards pathway spending extra time in any areas of fibrosis then retracing all steps and then medial breast toward pathway and ending with LN's. Pt practiced all steps of Left breast with intermittent VC's and TC's/ Therapist then performed right breast MLD and had pt perform all steps with VC's and TC's prn. Also had pt try in Right SL, but was no easier for her to reach axillo-inguinal pathway. Gave pt typed instructions. 03/08/2024 2 rectangular foam pads made for lower border of bra and placed in TG soft Initiated Breast MLD In supine: Short neck, 5 diaphragmatic breaths, L axillary nodes and establishment of interaxillary pathway, L inguinal nodes and establishment of axilloinguinal pathway, then L breast  lateral moving fluid towards pathway spending extra time in any areas of fibrosis then retracing all steps and then medial breast toward pathway and ending with LN's. Explained sequence, pressure and technique to pt while performing. Repeated on right breast with same technique. Discussed if pt would be comfortable with her sister learning MLD due to O2 and difficulty reaching, and will add chip packs next visit   02/29/2024 Assessed bilateral breasts and had Deborrah Fam, PT also check. In agreement OK to start MLD. Checked areas under breasts and feel it is fungal. Pt advised to try an anti fungal powder, and was given several telfa pads to put under breasts to prevent irritation. Showed pt lymphatics picture and explained superficial nature of technique and basic anatomy of lymphatics. Initiated MLD to left breast;short neck, 5 diaphragmatic breaths, left axillary and left inguinal LN's, left axillo-inguinal pathway, left lateral breast directing to axillo-inguinal pathway and retracing pathway and then left medial breast directing laterally and to axillo-inguinal  pathway retracing steps and then ending with left axillary and left inguinal LN's.  Repeated on the right as per the left. Pt was given a prescription to get a compression  bra and she will get Dr. Britta Candy to sign it so she may go to [employer] to get fit for a compression bra.   01/20/2024 Pts bilateral breasts very red,and warm and despite MD seeing her 2 days ago this therapist was concerned she may still have infection. Had Roslynn Coombes PTA, and Arnette Bias, PT both see pt and they were in agreement that she should not be treated at present. Messaged MD and let him know we were afraid we would be spreading infection, and asked if he would consider putting pt on Cipro or Keflex. She has Eye Sx for cataracts scheduled on Feb 10, and 25th so she would not likely be able to come for PT until March. Made appt for Re-eval on 02/21/2024.  Discussed importance of compression bra when infection clears and also asked MD to provide her with a script. Gave pt pictures of several bras and [employer] [facility] [employer]. Discussed how compression works and showed pt the wall picture of lymphatics and described that MLD is very gentle and that we will instruct her when it is safe to do so.   PATIENT EDUCATION:   02/29/2024:   Education details: Concerns of infection being spread if treated now, compression bra when infection cleared, gentle nature of MLD Person educated: Patient Education method: Explanation Education comprehension: verbalized understanding  HOME EXERCISE PROGRAM:   ASSESSMENT:  CLINICAL IMPRESSION: Medial breast  fibrosis noted left greater than right, and skin redness under breast left greater than right . Fibrosis does soften with manual techniques. Pt is really unable to comfortably reach to be independent in self MLD.Pt has had 6 weeks of conservative therapy including compression, elevation, exercise. She continues with bilateral breast lymphedema with skin changes including fibrosis and hyperpigmentation. She has significant difficulty reaching across body to perform self MLD and will benefit greatly from a Sequential compression pump to address swelling and to help decrease her continued risk of infection. DIAGNOSIS ASSESSMENT [x]  Lymphedema, not elsewhere classified [I89.0]  Secondary to:  []  Hereditary/Congenital lymphedema [Q82.0]  []  Praecox []  Tarda [x]  Other: secondary to bilateral Breast Cancer  LOCATION OF LYMPHEDEMA []   Right Leg  []  Left Leg  []  Pelvis/Genitals/Hips/Buttocks  []  Abdomen/Trunk  [x]  Other: Bilateral Breast Date of onset, or duration: Jan 2025  SEVERITY Symptoms and observations from physical exam Stage of Lymphedema:  [] Stage II  []  Stage III  [] Other: []  Hyperkeratosis [x]  Hyperpigmentation []  Lymphorrhea (weeping) []  Papillomatosis (warts, nodules, papules)  []  Elephantiasis  [x]   Fibrosis [x]  Pitting edema []  Recurrent episode(s) of infection/cellulitis []  Pain []  Venous leg ulcers and/or wounds []  Functional impairments to include limited range of motion or impaired mobility []  Other, explain:   CONSERVATIVE TREATMENT (CT) INITATION Start date (if different from this visit date):  Conservative treatment plan includes the following activities (check all that apply): []  Regular exercise  [x]  Elevation of the limb(s) [x]  Compression garment(s) or compression bandage system (Compression bra) []  Compression garments with a minimum of             mmHg distally  []  Compression bandage system  Type: []  Multi-layer short stretch []  Velcro compression wrap(s)    []  One-time use pre-packaged bandaging (i.e., Profore, Unna boot, Coban)  []  Other:  [x]  Complete decongestive therapy (CDT) or lymphedema therapy    :   :    OBJECTIVE IMPAIRMENTS: decreased knowledge of condition, increased edema, postural dysfunction, and pain.   ACTIVITY LIMITATIONS: sleeping and hygiene/grooming  PARTICIPATION  LIMITATIONS:  able to do most but with intermittent pain  PERSONAL FACTORS: 1-2 comorbidities: bilateral breast cancer, s/p radiation, Smoker  are also affecting patient's functional outcome.   REHAB POTENTIAL: Good  CLINICAL DECISION MAKING: Evolving/moderate complexity  EVALUATION COMPLEXITY: Moderate  GOALS: Goals reviewed with patient? Yes  SHORT TERM GOALS: Target date: 04/13/2024  Pt will purchase compression bra to decrease bilateral breast swelling Baseline: Goal status: MET 03/08/2024 2.  Pt will have decreased bilateral breast pain by 50% Baseline:  Goal status: MET 03/22/2024  3.  Pt will be  independent in MLD to bilateral breasts to decrease swelling Baseline:  Goal status: In progress;can not reach well to perform  4.  Breast complaints questionairre will be decreased to no greater than 20 to show improvements in swelling Baseline:  Goal status: MET  02/29/2024  5. Pt will receive compression pump to assist with bilateral breast swelling : GOAL STATUS;NEW  PLAN:  PT FREQUENCY:1- 2x/week  PT DURATION: 12 weeks,  PLANNED INTERVENTIONS: 97164- PT Re-evaluation, 97110-Therapeutic exercises, 97530- Therapeutic activity, 97535- Self Care, 82956- Manual therapy, 97760- Orthotic Fit/training, and Patient/Family education  PLAN FOR NEXT SESSION:,Continue instructing pt; only comes 1x per week.Check breast redness/swelling fibrosis, continue MLD  stress importance of compression, check under breasts area of redness/moistness. Sent info to [employer].  Latisha Poland, PT 04/12/2024, 12:51 PM [facility] [address] [place], [place], [address] Phone: [phone]   Fax:  [phone]  Patient Details  Name: GLEN JOW MRN: [id number] Date of Birth: [birth year] Referring Provider:  Elzie Handler, MD  Encounter Date: 04/12/2024   Latisha Poland, PT 04/12/2024, 12:51 PM  [place] [address] [place], [place], [address] Phone: [phone]   Fax:  [phone]

## 2024-04-16 NOTE — Progress Notes (Unsigned)
 Cardiology Office Note:  .   Date:  04/18/2024  ID:  Madison Medina, DOB February 03, 1961, MRN [id number] PCP: Fredi January, NP   [facility] Providers Cardiologist:  Zoe Hinds, MD    History of Present Illness: .   Madison Medina is a 63 y.o. female with a past medical history of carotid artery disease s/p left CEA 2020, nonobstructive CAD, history of breast cancer s/p radiation, palpitations, hypertension, history of stroke, history of tobacco abuse, OSA on CPAP--followed by Dr. Corita Diego.  07/29/2021 carotid duplex total occlusion of the right ICA, left ICA mild stenosis 05/10/2019 echo Grossly normal LV size and systolic function, valves were not well-visualized, technically difficult study 01/04/2019 left CEA 01/03/2019 cardiac catheterization mild nonobstructive CAD 01/01/2019 coronary CTA calcium  score 719, 99th percentile, FFR possible stenosis in LAD with recommendations for left heart cath  She established with [facility] in 2020 following a syncopal event, this occurred while she was at the cancer center being examined, ultimately felt to be orthostatic in nature.  She had also had a stroke in January 2020, felt to be secondary to severe carotid artery stenosis, prior to her being able to undergo CEA she had a coronary CTA which was elevated and possible stenosis in the LAD so she underwent a left heart cath which ultimately revealed nonobstructive CAD.  She was most recently evaluated by Dr. Sandee Crook on 08/31/2022, Stable from a cardiac perspective, she had been intolerant of Repatha  and was changed to Zetia , advised to follow-up in 1 year.  She presents today for follow-up of her CAD and carotid artery stenosis.  She has a lot of musculoskeletal pain, she is very sedentary.  Her only complaint today is chest pain that occurs a few times a week, typically in the evenings after a particularly stressful day, feels like a " stabbing sensation" that will last for a few minutes, on occasion she  has taken nitroglycerin  and it has relieved her pain.  During another physically active times throughout the day she does not experience this.  She uses CPAP at night with oxygen, she is compliant.  She follows closely with her PCP, seeing her at least once a month for pain management. She denies palpitations, dyspnea, pnd, orthopnea, n, v, dizziness, syncope, edema, weight gain, or early satiety.   ROS: Review of Systems  Cardiovascular:  Positive for chest pain.  Musculoskeletal:  Positive for back pain and joint pain.  All other systems reviewed and are negative.    Studies Reviewed: Madison Medina   EKG Interpretation Date/Time:  Tuesday Apr 18 2024 08:09:10 EDT Ventricular Rate:  72 PR Interval:  166 QRS Duration:  90 QT Interval:  432 QTC Calculation: 473 R Axis:   19  Text Interpretation: Normal sinus rhythm Nonspecific ST abnormality When compared with ECG of 01-Jan-2019 16:47, Nonspecific T wave abnormality, improved in Lateral leads QT has lengthened Confirmed by Pattricia Bores [phone]) on 04/18/2024 8:10:48 AM    Cardiac Studies & Procedures   ______________________________________________________________________________________________ CARDIAC CATHETERIZATION  CARDIAC CATHETERIZATION 01/03/2019  Conclusion  Widely patent left main  LAD is tortuous and contains segmental mid vessel 40 to 50% narrowing.  No focal high-grade obstruction is noted.  Second diagonal is large and also free of significant obstruction.  A large ramus intermedius branch contains mid vessel irregularity but no high-grade obstruction.  The circumflex system is relatively small in distribution.  No significant obstruction.  The right coronary is dominant.  Diffuse irregularities up to 40% from proximal to  distal vessel.  PDA contains eccentric 60% narrowing in the mid third.  LVEF greater than 60% with EDP greater than 25 to 30 mmHg.  RECOMMENDATIONS:   Angiographic images of the heart did not identify a  focal or significant obstructive lesion.  Therefore, therapy should be with medication/risk factor modification.  Proceed with the planned carotid surgical procedure.  Findings Coronary Findings Diagnostic  Dominance: Right  Left Anterior Descending Prox LAD lesion is 40% stenosed.  First Diagonal Branch Vessel is small in size.  First Septal Branch Vessel is small in size.  Ramus Intermedius Ramus lesion is 25% stenosed.  Right Coronary Artery Prox RCA to Mid RCA lesion is 40% stenosed.  Right Posterior Descending Artery RPDA lesion is 60% stenosed.  Intervention  No interventions have been documented.     ECHOCARDIOGRAM  ECHOCARDIOGRAM COMPLETE 01/02/2019  Narrative *[place]* *[facility]* [address] [place], [place] [address] [phone]  ------------------------------------------------------------------- Transthoracic Echocardiography  Patient:    Madison, Medina MR #:       [id number] Study Date: 01/02/2019 Gender:     F Age:        57 Height:     162.6 cm Weight:     121.7 kg BSA:        2.42 m^2 Pt. Status: Room:       3W15C  ORDERING     Everett Hitt REFERRING    Rinehuls, Lyda Samples, Inpatient SONOGRAPHER  [facility] ADMITTING    Ephriam Hashimoto REFERRING    Konnie Perry, Lauren ATTENDING    Reesa Cannon N  cc:  ------------------------------------------------------------------- LV EF: 55%  ------------------------------------------------------------------- Indications:      CVA 436.  ------------------------------------------------------------------- History:   PMH:   Chronic obstructive pulmonary disease.  Risk factors:  Current tobacco use. Hypertension. Morbidly obese. Dyslipidemia.  ------------------------------------------------------------------- Study Conclusions  - Left ventricle: The cavity size was normal. Wall thickness was increased in a pattern of mild LVH. The estimated  ejection fraction was 55%. Although no diagnostic regional wall motion abnormality was identified, this possibility cannot be completely excluded on the basis of this study. Features are consistent with a pseudonormal left ventricular filling pattern, with concomitant abnormal relaxation and increased filling pressure (grade 2 diastolic dysfunction). - Aortic valve: There was no stenosis. - Mitral valve: There was no significant regurgitation. - Right ventricle: Poorly visualized. The cavity size was normal. Systolic function was mildly reduced. - Tricuspid valve: Poorly visualized. - Pulmonary arteries: No complete TR doppler jet so unable to estimate PA systolic pressure. - Systemic veins: IVC measured 2.0 cm with < 50% respirophasic variation, suggesting RA pressure 8 mmHg.  Impressions:  - Technically difficult study with poor acoustic windows. Normal LV size with mild LV hypertrophy. EF 55%. Moderate diastolic dysfunction. Normal RV size with mildly decreased systolic function. No significant valvular abnormalities.  ------------------------------------------------------------------- Study data:  No prior study was available for comparison.  Study status:  Routine.  Procedure:  The patient reported no pain pre or post test. Transthoracic echocardiography. Image quality was suboptimal. The study was technically difficult, as a result of body habitus. Intravenous contrast (Definity ) was administered. Study completion:  There were no complications. Transthoracic echocardiography.  M-mode, complete 2D, spectral Doppler, and color Doppler.  Birthdate:  Patient birthdate: 1961/04/01.  Age:  Patient is 63 yr old.  Sex:  Gender: female. BMI: 46 kg/m^2.  Blood pressure:     137/46  Patient status: Inpatient.  Study date:  Study date: 01/02/2019. Study time: 10:56 AM.  Location:   Bedside.  -------------------------------------------------------------------  ------------------------------------------------------------------- Left ventricle:  The cavity size was normal. Wall thickness was increased in a pattern of mild LVH. The estimated ejection fraction was 55%. Although no diagnostic regional wall motion abnormality was identified, this possibility cannot be completely excluded on the basis of this study. Features are consistent with a pseudonormal left ventricular filling pattern, with concomitant abnormal relaxation and increased filling pressure (grade 2 diastolic dysfunction).  ------------------------------------------------------------------- Aortic valve:   Trileaflet; mildly calcified leaflets.  Doppler: There was no stenosis.   There was no regurgitation.  ------------------------------------------------------------------- Aorta:  Aortic root: The aortic root was normal in size. Ascending aorta: The ascending aorta was normal in size.  ------------------------------------------------------------------- Mitral valve:   Normal thickness leaflets .  Doppler:   There was no evidence for stenosis.   There was no significant regurgitation. Valve area by pressure half-time: 4.31 cm^2. Indexed valve area by pressure half-time: 1.78 cm^2/m^2.  ------------------------------------------------------------------- Left atrium:  The atrium was normal in size.  ------------------------------------------------------------------- Right ventricle:  Poorly visualized. The cavity size was normal. Systolic function was mildly reduced.  ------------------------------------------------------------------- Pulmonic valve:    Structurally normal valve.   Cusp separation was normal.  Doppler:  Transvalvular velocity was within the normal range. There was no regurgitation.  ------------------------------------------------------------------- Tricuspid valve:  Poorly  visualized.  ------------------------------------------------------------------- Pulmonary artery:   No complete TR doppler jet so unable to estimate PA systolic pressure.  ------------------------------------------------------------------- Right atrium:  The atrium was normal in size.  ------------------------------------------------------------------- Pericardium:  There was no pericardial effusion.  ------------------------------------------------------------------- Systemic veins:  IVC measured 2.0 cm with < 50% respirophasic variation, suggesting RA pressure 8 mmHg.  ------------------------------------------------------------------- Measurements  Left ventricle                           Value          Reference LV ID, ED, PLAX chordal                  45.3  mm       43 - 52 LV ID, ES, PLAX chordal                  35.3  mm       23 - 38 LV fx shortening, PLAX chordal   (L)     22    %        >=29 LV PW thickness, ED                      12.6  mm       ---------- Stroke volume, 2D                        60    ml       ---------- Stroke volume/bsa, 2D                    25    ml/m^2   ---------- LV ejection fraction, 1-p A4C            46    %        ---------- LV end-diastolic volume, 2-p             98    ml       ---------- LV end-diastolic volume/bsa, 2-p  40    ml/m^2   ---------- LV e&', lateral                           8.38  cm/s     ---------- LV E/e&', lateral                         8.39           ---------- LV e&', medial                            6.85  cm/s     ---------- LV E/e&', medial                          10.26          ---------- LV e&', average                           7.62  cm/s     ---------- LV E/e&', average                         9.23           ----------  Ventricular septum                       Value          Reference IVS thickness, ED                        16.1  mm       ----------  LVOT                                     Value           Reference LVOT ID, S                               23    mm       ---------- LVOT area                                4.15  cm^2     ---------- LVOT peak velocity, S                    68.7  cm/s     ---------- LVOT mean velocity, S                    45.2  cm/s     ---------- LVOT VTI, S                              14.5  cm       ---------- LVOT peak gradient, S                    2     mm Hg    ----------  Aorta  Value          Reference Aortic root ID, ED                       30    mm       ---------- Ascending aorta ID, A-P, S               32    mm       ----------  Left atrium                              Value          Reference LA ID, A-P, ES                           37    mm       ---------- LA ID/bsa, A-P                           1.53  cm/m^2   <=2.2 LA volume, S                             54.5  ml       ---------- LA volume/bsa, S                         22.6  ml/m^2   ---------- LA volume, ES, 1-p A4C                   50.5  ml       ---------- LA volume/bsa, ES, 1-p A4C               20.9  ml/m^2   ---------- LA volume, ES, 1-p A2C                   59.2  ml       ---------- LA volume/bsa, ES, 1-p A2C               24.5  ml/m^2   ----------  Mitral valve                             Value          Reference Mitral E-wave peak velocity              70.3  cm/s     ---------- Mitral A-wave peak velocity              43.7  cm/s     ---------- Mitral deceleration time                 173   ms       150 - 230 Mitral pressure half-time                51    ms       ---------- Mitral E/A ratio, peak                   1.6            ---------- Mitral valve area, PHT, DP               4.31  cm^2     ---------- Mitral valve area/bsa, PHT, DP           1.78  cm^2/m^2 ----------  Right atrium                             Value          Reference RA ID, S-I, ES, A4C                      44.7  mm       34 - 49 RA area, ES, A4C                          13.3  cm^2     8.3 - 19.5 RA volume, ES, A/L                       30.9  ml       ---------- RA volume/bsa, ES, A/L                   12.8  ml/m^2   ----------  Right ventricle                          Value          Reference TAPSE                                    12.7  mm       ---------- RV s&', lateral, S                        8.81  cm/s     ----------  Legend: (L)  and  (H)  mark values outside specified reference range.  ------------------------------------------------------------------- Prepared and Electronically Authenticated by  Peder Bourdon, M.D. 2020-01-20T14:23:24    MONITORS  CARDIAC EVENT MONITOR 07/05/2019  Narrative And event monitor was performed for 30 days beginning 06/02/2019 to evaluate syncope.  Baseline rhythm was sinus 93 bpm normal conduction.  The minimum average and maximum heart rates 51, 79 and 116 bpm.  There were no pauses or episodes of sinus node or AV block.  There was no ventricular or supraventricular arrhythmia.  Therefore stable events dizziness shortness of breath and fatigue all associated with sinus rhythm.   Conclusion unremarkable 30-day event monitor   CT SCANS  CT CORONARY FRACTIONAL FLOW RESERVE DATA PREP 01/02/2019  Narrative EXAM: CT FFR ANALYSIS  CLINICAL DATA:  63 year old obese patient with carotid disease and DOE.  FINDINGS: FFRct analysis was performed on the original cardiac CT angiogram dataset. Diagrammatic representation of the FFRct analysis is provided in a separate PDF document in PACS. This dictation was created using the PDF document and an interactive 3D model of the results. 3D model is not available in the EMR/PACS. Normal FFR range is >0.80.  1. Left Main:  No significant stenosis.  2. LAD: Proximal: 0.91, mid 0.81, distal: 0.8. 3. LCX: No significant stenosis. 4. RCA: No significant stenosis.  IMPRESSION: 1. CT FFR showed borderline stenosis in the proximal to mid LAD. Given  that is it proximal LAD a cardiac catheterization is recommended.   Electronically Signed By: Christoper Crafts On: 01/03/2019 09:12   CT CORONARY MORPH  W/CTA COR W/SCORE 01/02/2019  Addendum 01/02/2019  5:56 PM ADDENDUM REPORT: 01/02/2019 17:53  CLINICAL DATA:  63 year old female with h/o carotid disease, morbid obesity and DOE.  EXAM: Cardiac/Coronary  CT  TECHNIQUE: The patient was scanned on a [employer].  FINDINGS: A 120 kV prospective scan was triggered in the descending thoracic aorta at 111 HU's. Axial non-contrast 3 mm slices were carried out through the heart. The data set was analyzed on a dedicated work station and scored using the Agatson method. Gantry rotation speed was 250 msecs and collimation was .6 mm. No beta blockade and 0.8 mg of sl NTG was given. The 3D data set was reconstructed in 5% intervals of the 67-82 % of the R-R cycle. Diastolic phases were analyzed on a dedicated work station using MPR, MIP and VRT modes. The patient received 80 cc of contrast.  Aorta: Normal size. Mild diffuse atherosclerotic plaque and calcifications. No dissection.  Aortic Valve:  Trileaflet.  No calcifications.  Coronary Arteries:  Normal coronary origin.  Right dominance.  RCA is a large dominant artery that gives Medina to PDA and PLA. There is moderate diffuse mixed plaque with focal stenoses 50-60% in the mid and distal RCA, proximal PLA and mid PDA.  Left main is a large artery that gives Medina to LAD, a ramus intermedius and LCX arteries. Left main has no obvious plaque.  LAD is a large vessel that gives Medina to two small diagonal arteries. Proximal LAD has long diffuse moderate calcified plaque with stenosis 50-69%. This is followed by a non-calcified plaque in the mid LAD with suspicion for stenosis > 70%. Distal LAD has only mild plaque.  D1,2 are small with mild plaque.  Ramus intermedius is a medium size artery with moderate  calcified plaque in the proximal portion with stenosis 50-69%.  LCX is a small caliber non-dominant artery that has mild diffuse plaque.  Other findings:  Normal pulmonary vein drainage into the left atrium.  Normal let atrial appendage without a thrombus.  Normal size of the pulmonary artery.  IMPRESSION: 1. Coronary calcium  score of 719. This was 57 percentile for age and sex matched control.  2. Normal coronary origin with right dominance.  3. Moderate to severe CAD with moderate stenosis in the mid and distal RCA, proximal PLA, mid PDA, proximal LAD and suspicion for severe stenosis in the mid LAD. Additional analysis with CT FFR will be submitted.   Electronically Signed By: Christoper Crafts On: 01/02/2019 17:53  Narrative EXAM: OVER-READ INTERPRETATION  CT CHEST  The following report is an over-read performed by radiologist Dr. Lore Rode of [facility] on 01/02/2019. This over-read does not include interpretation of cardiac or coronary anatomy or pathology. The coronary CTA interpretation by the cardiologist is attached.  COMPARISON:  Chest radiograph 12/29/2018. Complete chest CT of 06/04/2011.  FINDINGS: Vascular: Aortic atherosclerosis. No central pulmonary embolism, on this non-dedicated study.  Mediastinum/Nodes: No imaged thoracic adenopathy.  Lungs/Pleura: No imaged pleural fluid. Probable scarring at the left lung base. An area of nodularity including image 48/14 is similar (given differences in technique) to the 06/04/2011 exam and can be presumed benign.  Upper Abdomen: Normal imaged portions of the liver, spleen, stomach.  Musculoskeletal: No acute osseous abnormality.  IMPRESSION: 1.  No acute findings in the imaged extracardiac chest. 2.  Aortic Atherosclerosis (ICD10-I70.0).  Electronically Signed: By: Lore Rode M.D. On: 01/02/2019 14:50      ______________________________________________________________________________________________      Risk Assessment/Calculations:  Physical Exam:   VS:  BP (!) 140/80 Comment: appt 8 am and has not taken her antihypertensive meds yet  Pulse 72   Ht 5\' 4"  (1.626 m)   Wt 286 lb (129.7 kg)   SpO2 96%   BMI 49.09 kg/m    Wt Readings from Last 3 Encounters:  04/18/24 286 lb (129.7 kg)  12/25/22 281 lb 3.2 oz (127.6 kg)  08/31/22 282 lb (127.9 kg)    GEN: Well nourished, well developed in no acute distress NECK: No JVD; No carotid bruits CARDIAC: RRR, no murmurs, rubs, gallops RESPIRATORY:  Clear to auscultation without rales, wheezing or rhonchi  ABDOMEN: Soft, non-tender, non-distended EXTREMITIES:  No edema; No deformity   ASSESSMENT AND PLAN: .   CAD-nonobstructive per left heart cath in 2020.  She has having episodes of atypical chest pain however has been relieved with nitroglycerin .  Her EKG is without changes today.  I will increase her Imdur  to 60 mg daily.  Continue Norvasc 5 mg daily, continue aspirin  81 mg daily, continue Plavix 75 mg daily, Crestor  40 mg daily, Continue nitroglycerin  as needed.  Carotid artery stenosis-s/p left CEA in 2020 >> duplex in 2022 revealed occlusion of her right CEA and mild stenosis in left CEA.  Will repeat carotid duplex.  Continue Plavix 75 mg daily, aspirin  81 mg daily, Crestor  40 mg daily.   Hypertension-blood pressure was elevated this morning at 140/80 however appointment at 8 AM she has not taken her lisinopril yet.  Not make any changes at this time.  Continue lisinopril 20 mg daily.  Dyslipidemia-is formally monitored by her PCP and she states it was checked around January of this year, will request lab work from her PCP.  Currently on Crestor  40 mg daily.    Palpitations-quiescent  History of stroke-on Plavix and Crestor .       Dispo: Increase Imdur  to 60 mg daily, request lab work from PCP, follow-up in 3 months to  see if her symptoms are improved after increasing her Imdur .  Signed, Terrance Ferretti, NP

## 2024-04-18 ENCOUNTER — Encounter: Payer: Self-pay | Admitting: Cardiology

## 2024-04-18 ENCOUNTER — Ambulatory Visit: Attending: Cardiology | Admitting: Cardiology

## 2024-04-18 VITALS — BP 140/80 | HR 72 | Ht 64.0 in | Wt 286.0 lb

## 2024-04-18 DIAGNOSIS — I6523 Occlusion and stenosis of bilateral carotid arteries: Secondary | ICD-10-CM | POA: Diagnosis not present

## 2024-04-18 DIAGNOSIS — I2510 Atherosclerotic heart disease of native coronary artery without angina pectoris: Secondary | ICD-10-CM | POA: Diagnosis not present

## 2024-04-18 DIAGNOSIS — R002 Palpitations: Secondary | ICD-10-CM | POA: Diagnosis not present

## 2024-04-18 DIAGNOSIS — I10 Essential (primary) hypertension: Secondary | ICD-10-CM

## 2024-04-18 DIAGNOSIS — I63232 Cerebral infarction due to unspecified occlusion or stenosis of left carotid arteries: Secondary | ICD-10-CM

## 2024-04-18 MED ORDER — ISOSORBIDE MONONITRATE ER 60 MG PO TB24: 60.0000 mg | ORAL_TABLET | Freq: Every day | ORAL | 3 refills | Status: DC

## 2024-04-18 NOTE — Patient Instructions (Signed)
 Medication Instructions:  Your physician has recommended you make the following change in your medication:   START: Imdur  60 mg daily  *If you need a refill on your cardiac medications before your next appointment, please call your pharmacy*  Lab Work: None If you have labs (blood work) drawn today and your tests are completely normal, you will receive your results only by: MyChart Message (if you have MyChart) OR A paper copy in the mail If you have any lab test that is abnormal or we need to change your treatment, we will call you to review the results.  Testing/Procedures: Your physician has requested that you have a carotid duplex. This test is an ultrasound of the carotid arteries in your neck. It looks at blood flow through these arteries that supply the brain with blood. Allow one hour for this exam. There are no restrictions or special instructions.   Follow-Up: At [facility], you and your health needs are our priority.  As part of our continuing mission to provide you with exceptional heart care, our providers are all part of one team.  This team includes your primary Cardiologist (physician) and Advanced Practice Providers or APPs (Physician Assistants and Nurse Practitioners) who all work together to provide you with the care you need, when you need it.  Your next appointment:   3 month(s)  Provider:   Pattricia Bores, NP Georgeana Kindler)    We recommend signing up for the patient portal called "MyChart".  Sign up information is provided on this After Visit Summary.  MyChart is used to connect with patients for Virtual Visits (Telemedicine).  Patients are able to view lab/test results, encounter notes, upcoming appointments, etc.  Non-urgent messages can be sent to your provider as well.   To learn more about what you can do with MyChart, go to [email or web address].   Other Instructions None

## 2024-04-19 ENCOUNTER — Ambulatory Visit: Attending: Surgery

## 2024-04-19 DIAGNOSIS — N61 Mastitis without abscess: Secondary | ICD-10-CM | POA: Diagnosis present

## 2024-04-19 DIAGNOSIS — Z853 Personal history of malignant neoplasm of breast: Secondary | ICD-10-CM | POA: Insufficient documentation

## 2024-04-19 DIAGNOSIS — I890 Lymphedema, not elsewhere classified: Secondary | ICD-10-CM | POA: Insufficient documentation

## 2024-04-19 NOTE — Therapy (Signed)
 OUTPATIENT PHYSICAL THERAPY  UPPER EXTREMITY ONCOLOGY TREATMENT  Patient Name: Madison Medina MRN: [id number] DOB:February 05, 1961, 63 y.o., female Today's Date: 04/19/2024  END OF SESSION:  PT End of Session - 04/19/24 0903     Visit Number 8    Number of Visits 12    Date for PT Re-Evaluation 04/19/24    Authorization Type [employer]    Authorization Time Period 3/18-04/19/2024 1 re-eval and 12 visits (Madonna extended date to include last scheduled visit)    Authorization - Visit Number 7    Authorization - Number of Visits 12    PT Start Time 0903    PT Stop Time 0954    PT Time Calculation (min) 51 min    Activity Tolerance Patient tolerated treatment well    Behavior During Therapy [facility] for tasks assessed/performed               Past Medical History:  Diagnosis Date   AKI (acute kidney injury) (HCC) 12/30/2018   Anxiety 10/12/2016   Asthma    Back pain    CAD (coronary artery disease) 01/03/2019   Carotid artery occlusion    COPD (chronic obstructive pulmonary disease) (HCC)    CVA (cerebral vascular accident) (HCC) 12/30/2018   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   HTN (hypertension) 12/30/2018   Hyperlipidemia    Hypertension    Hyponatremia 12/30/2018   Hypothyroid 02/03/2016   Hypothyroidism    Nonobstructive atherosclerosis of coronary artery 08/03/2019   Stroke (HCC)    Tobacco use 07/09/2016   Past Surgical History:  Procedure Laterality Date   BREAST BIOPSY  12/2018   at breast center in [place]   CARDIAC CATHETERIZATION     CAROTID ENDARTERECTOMY     CARPAL TUNNEL RELEASE     CYST EXCISION     ENDARTERECTOMY Left 01/04/2019   Procedure: ENDARTERECTOMY CAROTID LEFT;  Surgeon: Margherita Shell, MD;  Location: [facility] OR;  Service: Vascular;  Laterality: Left;   LEFT HEART CATH AND CORONARY ANGIOGRAPHY N/A 01/03/2019   Procedure: LEFT HEART CATH AND CORONARY ANGIOGRAPHY;  Surgeon: Arty Binning, MD;  Location: [facility] INVASIVE CV LAB;  Service: Cardiovascular;   Laterality: N/A;   PATCH ANGIOPLASTY Left 01/04/2019   Procedure: PATCH ANGIOPLASTY USING Corinna Dickens BIOLOGIC PATCH;  Surgeon: Margherita Shell, MD;  Location: [facility] OR;  Service: Vascular;  Laterality: Left;   Patient Active Problem List   Diagnosis Date Noted   Stroke [facility])    Hypothyroidism    Hyperlipidemia    Carotid artery occlusion    Back pain    Asthma    Bilateral breast cancer (HCC) 11/22/2019   Morbid obesity with BMI of 45.0-49.9, adult (HCC) 11/22/2019   Nonobstructive atherosclerosis of coronary artery 08/03/2019   CAD (coronary artery disease) 01/03/2019   CVA (cerebral vascular accident) (HCC) 12/30/2018   HTN (hypertension) 12/30/2018   COPD (chronic obstructive pulmonary disease) (HCC) 12/30/2018   AKI (acute kidney injury) (HCC) 12/30/2018   Hyponatremia 12/30/2018   Breast cancer in female [facility]) 12/28/2018    Class: History of   Breast cancer of upper-outer quadrant of left female breast (HCC) 12/28/2018    Class: History of   Acute pharyngitis 10/29/2016   Oral thrush 10/29/2016   Essential hypertension 10/23/2016   Anxiety 10/12/2016   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   Tobacco use 07/09/2016   Sleep apnea 07/09/2016   Abnormal glucose 07/09/2016   Arthritis, degenerative 07/09/2016   Insomnia 07/09/2016   Tremor of right hand  07/09/2016   Hypothyroid 02/03/2016   COPD exacerbation (HCC) 02/03/2016   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 I still have the redness under the left breast. I still get the fibrosis worse on the left side, but overall about 70%. I have not had any pain in quite some time. I feel ready to be discharged  EVAL(01/20/2024):See history Antibiotic was finished and breast redness gone after antibiotics in Nov/Dec but she developed redness and swelling in bilateral breasts after having a mammogram done in January 2025. She still has redness but MD feels she is not infected. Her breasts are tender but not painful. She feels firm areas in her breast. She does not have a compression bra.  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 This was followed by radiation, and Anastozole.Pt had ist episode of bilateral mastitis in 12/2022 which resolved. In November it returned and she was placed on antibiotics and had follow up on 11/26/ and 11/23/23 with mastitis showing improvement. She had improved and was doing well until her mammogram in January and she developed swelling in bilateral breasts the next day. Mammogram  did not show any abnormalities. She was seen on 01/18/2024 with improvement noted but still slightly red  PAIN:  Are you having pain? No   PRECAUTIONS: bilateral UE lymphedema risk, COPD, CVA , CAD, hypothyroidism  RED FLAGS: Bowel or bladder incontinence: Yes:      WEIGHT BEARING RESTRICTIONS: No  FALLS:  Has patient fallen in last 6 months? No  LIVING ENVIRONMENT: Lives with: lives with their spouse Lives in: Mobile home Stairs: No;  Has following equipment at home:   OCCUPATION: disabled  LEISURE: read, watch TV, some walking  HAND DOMINANCE: right   PRIOR LEVEL OF FUNCTION: Independent  PATIENT GOALS: Decrease swelling and tenderness   OBJECTIVE: Note: Objective measures were completed at Evaluation unless otherwise noted.  COGNITION: Overall cognitive status: Within functional  limits for tasks assessed   PALPATION: Bilateral breasts warm to touch  OBSERVATIONS / OTHER ASSESSMENTS:01/20/2024 Bilateral breast with  significant redness but was seen by MD 2 days ago. Left breast with very small skin tear underneath with redness and irritation. Fibrosis noted medal breast. Nipple very swollen, and retracted, Right breast also with redness, medial fibrosis, nipple swelling 02/29/2024 Decreased redness bilateral breast;fibrosis still noted medially bilaterally with swollen nipples, Very red and irritated under bilateral breast; appears to be very moist and appears to be fungal. Pt will try anti-fungal cream/powder and will ask MD if she requires something that is prescription  SENSATION: Light touch: Appears intact  POSTURE: forward head, rounded shoulders  UPPER EXTREMITY AROM/PROM:  A/PROM RIGHT   eval   Shoulder extension   Shoulder flexion 120  Shoulder abduction 113  Shoulder internal rotation   Shoulder external rotation     (Blank rows = not tested)  A/PROM LEFT   eval  Shoulder extension   Shoulder flexion 125  Shoulder abduction 115  Shoulder internal rotation   Shoulder external rotation     (Blank rows = not tested)  UPPER EXTREMITY STRENGTH:   LYMPHEDEMA ASSESSMENTS:  SURGERY TYPE/DATE: Bilateral lumpectomies 03/09/2019 NUMBER OF LYMPH NODES REMOVED: 0/1 Bilaterally CHEMOTHERAPY: NO RADIATION:YES HORMONE TREATMENT: YES INFECTIONS: YES  GAIT: Distance walked: front office to room 8  Assistive device utilized: Single point cane Level of assistance: Complete Independence  BREAST COMPLAINTS QUESTIONNAIRE(01/20/2024) Pain:6 Heaviness:0 Swollen feeling:8 Tense Skin:8 Redness:9 Bra Print:0 Size of Pores:0 Hard feeling: 10 Total:    41 /80 A Score over 9 indicates lymphedema issues in the breast  BREAST COMPLAINTS QUESTIONNAIRE(02/29/2024) Pain:1 Heaviness:2 Swollen feeling:1 Tense Skin:1 Redness:2 Bra Print:0 Size of Pores:1 Hard  feeling: 1 Total:   9  /80 A Score over 9 indicates lymphedema issues in the breast                                                                                                                       TREATMENT DATE:  04/19/2024 Discussed pump; Should hear soon from [facility]. Asked pt to call me if she has questions or concerns Mild redness still present at anterior breasts Redness still very present under left greater than right breast. Suggested she see MD as current treatment is not sufficient Manual Therapy Pt continues with medial fibrosis, left greater than right, and redness under left breast greater than right and left axillary region. Pt treating with anti fungal powder In supine: Short neck, 5 diaphragmatic breaths, L axillary nodes and establishment of interaxillary pathway, L inguinal nodes and establishment of axilloinguinal pathway, then L breast lateral moving fluid towards pathway spending extra time in any areas of fibrosis then retracing all steps and then medial breast toward pathway and ending with LN's. Repeated on right breast with same technique. Pt practiced moving fluid from medial breast fibrosis toward ipsilateral axilla,clavicular nodes on both sides. Using good stretch but with VC's for less pressure  04/12/2024 Manual Therapy Pt continues with medial fibrosis, left greater than right, and redness under left breast greater than right and left axillary  region. Pt treating with anti fungal powder In supine: Short neck, 5 diaphragmatic breaths, L axillary nodes and establishment of interaxillary pathway, L inguinal nodes and establishment of axilloinguinal pathway, then L breast lateral moving fluid towards pathway spending extra time in any areas of fibrosis then retracing all steps and then medial breast toward pathway and ending with LN's. Repeated on right breast with same technique. Briefly explained to pt how to do MLD to axillary/pectoral sternal nodes as needed  due to being unable to reach her opposite side. 04/05/24: Manual Therapy In supine: Short neck, 5 diaphragmatic breaths, L inguinal nodes and establishment of Lt axilloinguinal pathway, then L breast lateral moving fluid towards pathway spending extra time in any areas of fibrosis then retracing all steps and then medial breast toward pathway, then into Rt S/L for further focus to lateral breast and ending in supine with inguinal LN's. Then performed right breast MLD per the same and included time spent in Lt S/L and ended in supine at LN's.   03/29/2024 In supine: Short neck, 5 diaphragmatic breaths, L axillary nodes and L inguinal nodes and establishment of axilloinguinal pathway, then L breast lateral moving fluid towards pathway spending extra time in any areas of fibrosis then retracing all steps and then medial breast toward pathway and ending with LN's. then performed right breast MLD per the same. Suggested she try antifungal power in Lt axilla.   03/22/2024 Breasts assessed; still mildly red, but no pain. Still slightly irritated under breasts. Fibrosis not as noticeable in sitting, but firmness noted greatest bilateral medial breast greatest on left Made 2 long chip packs for inferior medial breast fibrosis bilaterally for pt to tuck into her bra In supine: Short neck, 5 diaphragmatic breaths, L axillary nodes and establishment of interaxillary pathway, L inguinal nodes and establishment of axilloinguinal pathway, then L breast lateral moving fluid towards pathway spending extra time in any areas of fibrosis then retracing all steps and then medial breast toward pathway and ending with LN's. Pt practiced briefly  steps of Left breast with intermittent VC's and TC's. She still has great difficulty reaching axillo-inguinal pathway. Will try in her bed at home but also discussed modifications prn to direct to axillary/pectoral LN's prn/ Therapist then performed right breast MLD with verbal review to pt  about modifications to prevent shoulder pain. Assisted pt with chip packs. Discussed pump with pt. [facility]) pt is interested in pursuing and is agreeable with benefits being sent. Sent today.  03/15/2024 Mild redness still present in breasts and slightly under breast, and healed redness noted in armpits as well In supine: Short neck, 5 diaphragmatic breaths, L axillary nodes and establishment of interaxillary pathway, L inguinal nodes and establishment of axilloinguinal pathway, then L breast lateral moving fluid towards pathway spending extra time in any areas of fibrosis then retracing all steps and then medial breast toward pathway and ending with LN's. Pt practiced all steps of Left breast with intermittent VC's and TC's/ Therapist then performed right breast MLD and had pt perform all steps with VC's and TC's prn. Also had pt try in Right SL, but was no easier for her to reach axillo-inguinal pathway. Gave pt typed instructions. 03/08/2024 2 rectangular foam pads made for lower border of bra and placed in TG soft Initiated Breast MLD In supine: Short neck, 5 diaphragmatic breaths, L axillary nodes and establishment of interaxillary pathway, L inguinal nodes and establishment of axilloinguinal pathway, then L breast lateral moving fluid towards pathway  spending extra time in any areas of fibrosis then retracing all steps and then medial breast toward pathway and ending with LN's. Explained sequence, pressure and technique to pt while performing. Repeated on right breast with same technique. Discussed if pt would be comfortable with her sister learning MLD due to O2 and difficulty reaching, and will add chip packs next visit   02/29/2024 Assessed bilateral breasts and had Deborrah Fam, PT also check. In agreement OK to start MLD. Checked areas under breasts and feel it is fungal. Pt advised to try an anti fungal powder, and was given several telfa pads to put under breasts to prevent irritation. Showed  pt lymphatics picture and explained superficial nature of technique and basic anatomy of lymphatics. Initiated MLD to left breast;short neck, 5 diaphragmatic breaths, left axillary and left inguinal LN's, left axillo-inguinal pathway, left lateral breast directing to axillo-inguinal pathway and retracing pathway and then left medial breast directing laterally and to axillo-inguinal  pathway retracing steps and then ending with left axillary and left inguinal LN's.  Repeated on the right as per the left. Pt was given a prescription to get a compression bra and she will get Dr. Britta Candy to sign it so she may go to [employer] to get fit for a compression bra.   01/20/2024 Pts bilateral breasts very red,and warm and despite MD seeing her 2 days ago this therapist was concerned she may still have infection. Had Roslynn Coombes PTA, and Arnette Bias, PT both see pt and they were in agreement that she should not be treated at present. Messaged MD and let him know we were afraid we would be spreading infection, and asked if he would consider putting pt on Cipro or Keflex. She has Eye Sx for cataracts scheduled on Feb 10, and 25th so she would not likely be able to come for PT until March. Made appt for Re-eval on 02/21/2024. Discussed importance of compression bra when infection clears and also asked MD to provide her with a script. Gave pt pictures of several bras and [employer] [facility] [employer]. Discussed how compression works and showed pt the wall picture of lymphatics and described that MLD is very gentle and that we will instruct her when it is safe to do so.   PATIENT EDUCATION:   02/29/2024:   Education details: Concerns of infection being spread if treated now, compression bra when infection cleared, gentle nature of MLD Person educated: Patient Education method: Explanation Education comprehension: verbalized understanding  HOME EXERCISE PROGRAM:   ASSESSMENT:  CLINICAL IMPRESSION: Medial  breast  fibrosis noted left greater than right, and skin redness under breast left greater than right . Fibrosis does soften with manual techniques. Pt is really unable to comfortably reach to be independent in self MLD, but has been shown how to bring medial fluid toward ipsilateral armpit. She has no pain, and swelling has reduced by 70% per pt. She is awaiting her compression pump to assist with breast lymphedema. She has achieved most goals except we had to modify her self MLD due to difficulty reaching. She is compliant with her compression bra and her breast complaints survey is reduced to 9/80 demonstrating good improvement. Pt feels she is ready to be discharged, but was advised to see MD about redness under her breasts, and was advised to contact me with any questions or concerns.  DIAGNOSIS ASSESSMENT [x]  Lymphedema, not elsewhere classified [I89.0]  Secondary to:  []  Hereditary/Congenital lymphedema [Q82.0]  []  Praecox []  Tarda [x]   Other: secondary to bilateral Breast Cancer  LOCATION OF LYMPHEDEMA []   Right Leg  []  Left Leg  []  Pelvis/Genitals/Hips/Buttocks  []  Abdomen/Trunk  [x]  Other: Bilateral Breast Date of onset, or duration: Jan 2025  SEVERITY Symptoms and observations from physical exam Stage of Lymphedema:  [] Stage II  []  Stage III  [] Other: []  Hyperkeratosis [x]  Hyperpigmentation []  Lymphorrhea (weeping) []  Papillomatosis (warts, nodules, papules)  []  Elephantiasis  [x]  Fibrosis [x]  Pitting edema []  Recurrent episode(s) of infection/cellulitis []  Pain []  Venous leg ulcers and/or wounds []  Functional impairments to include limited range of motion or impaired mobility []  Other, explain:   CONSERVATIVE TREATMENT (CT) INITATION Start date (if different from this visit date):  Conservative treatment plan includes the following activities (check all that apply): []  Regular exercise  [x]  Elevation of the limb(s) [x]  Compression garment(s) or compression bandage system  (Compression bra) []  Compression garments with a minimum of             mmHg distally  []  Compression bandage system  Type: []  Multi-layer short stretch []  Velcro compression wrap(s)    []  One-time use pre-packaged bandaging (i.e., Profore, Unna boot, Coban)  []  Other:  [x]  Complete decongestive therapy (CDT) or lymphedema therapy    :   :    OBJECTIVE IMPAIRMENTS: decreased knowledge of condition, increased edema, postural dysfunction, and pain.   ACTIVITY LIMITATIONS: sleeping and hygiene/grooming  PARTICIPATION LIMITATIONS:  able to do most but with intermittent pain  PERSONAL FACTORS: 1-2 comorbidities: bilateral breast cancer, s/p radiation, Smoker  are also affecting patient's functional outcome.   REHAB POTENTIAL: Good  CLINICAL DECISION MAKING: Evolving/moderate complexity  EVALUATION COMPLEXITY: Moderate  GOALS: Goals reviewed with patient? Yes  SHORT TERM GOALS: Target date: 04/13/2024  Pt will purchase compression bra to decrease bilateral breast swelling Baseline: Goal status: MET 03/08/2024 2.  Pt will have decreased bilateral breast pain by 50% Baseline:  Goal status: MET 03/22/2024  3.  Pt will be  independent in MLD to bilateral breasts to decrease swelling Baseline:  Goal status:Partially MET, modified so pt is able to reach    4.  Breast complaints questionairre will be decreased to no greater than 20 to show improvements in swelling Baseline:  Goal status: MET 02/29/2024  5. Pt will receive compression pump to assist with bilateral breast swelling : GOAL STATUS;Partially MET, not yet received, all paperwork sent and received, checking benefits.  PLAN:  PT FREQUENCY:1- 2x/week  PT DURATION: 12 weeks,  PLANNED INTERVENTIONS: 97164- PT Re-evaluation, 97110-Therapeutic exercises, 97530- Therapeutic activity, 97535- Self Care, 84696- Manual therapy, 97760- Orthotic Fit/training, and Patient/Family education  PLAN FOR NEXT SESSION:,Continue instructing  pt; only comes 1x per week.Check breast redness/swelling fibrosis, continue MLD  stress importance of compression, check under breasts area of redness/moistness. Sent info to [employer]. PHYSICAL THERAPY DISCHARGE SUMMARY  Visits from Start of Care: 8  Current functional level related to goals / functional outcomes: Partially achieved goals   Remaining deficits: Fibrosis still noted medial breasts, redness under breasts and mild redness anterior breast but does not appear infected   Education / Equipment: Education in self MLD, Compression pump   Patient agrees to discharge. Patient goals were partially met. Patient is being discharged due to being pleased with the current functional level. She will remain in contact with me if there are any questions about the pump or concerns.   Latisha Poland, PT 04/19/2024, 9:55 AM  [facility] [address] [place],  [place], [address] Phone: [phone]   Fax:  [phone]  Patient Details  Name: BEEDIE KRANZ MRN: [id number] Date of Birth: 1961-07-10 Referring Provider:  Elzie Handler, MD  Encounter Date: 04/19/2024   Latisha Poland, PT 04/19/2024, 9:55 AM  [facility] [address] [place], [place], [address] Phone: [phone]   Fax:  [phone]

## 2024-04-21 ENCOUNTER — Ambulatory Visit (HOSPITAL_COMMUNITY)
Admission: RE | Admit: 2024-04-21 | Discharge: 2024-04-21 | Disposition: A | Discharge status: Home / Self Care | Source: Ambulatory Visit | Attending: Cardiology | Admitting: Cardiology

## 2024-04-21 DIAGNOSIS — I63232 Cerebral infarction due to unspecified occlusion or stenosis of left carotid arteries: Secondary | ICD-10-CM | POA: Insufficient documentation

## 2024-04-21 DIAGNOSIS — I2510 Atherosclerotic heart disease of native coronary artery without angina pectoris: Secondary | ICD-10-CM | POA: Diagnosis not present

## 2024-04-21 DIAGNOSIS — I6523 Occlusion and stenosis of bilateral carotid arteries: Secondary | ICD-10-CM | POA: Insufficient documentation

## 2024-04-21 DIAGNOSIS — I10 Essential (primary) hypertension: Secondary | ICD-10-CM | POA: Diagnosis present

## 2024-04-25 ENCOUNTER — Ambulatory Visit: Payer: Self-pay | Admitting: Emergency Medicine

## 2024-04-25 NOTE — Telephone Encounter (Signed)
 Called and spoke to patient.   Reviewed results of carotid ultrasound with patient as per [facility] note. She verbalized understanding and had no further questions.

## 2024-04-25 NOTE — Telephone Encounter (Signed)
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

## 2024-07-18 ENCOUNTER — Other Ambulatory Visit: Payer: Self-pay

## 2024-07-18 NOTE — Progress Notes (Unsigned)
 Cardiology Office Note:  .   Date:  07/18/2024  ID:  Madison Medina, DOB 1961/08/18, MRN [id number] PCP: Servando Leeroy Ground, NP  [facility] Providers Cardiologist:  Redell Leiter, MD    History of Present Illness: .   Madison Medina is a 63 y.o. female with a past medical history of carotid artery disease s/p left CEA 2020, nonobstructive CAD, history of breast cancer s/p radiation, palpitations, hypertension, history of stroke, history of tobacco abuse, OSA on CPAP--followed by Dr. Mardee.  07/29/2021 carotid duplex total occlusion of the right ICA, left ICA mild stenosis 05/10/2019 echo Grossly normal LV size and systolic function, valves were not well-visualized, technically difficult study 01/04/2019 left CEA 01/03/2019 cardiac catheterization mild nonobstructive CAD 01/01/2019 coronary CTA calcium  score 719, 99th percentile, FFR possible stenosis in LAD with recommendations for left heart cath  [facility] with [facility] in 2020 following a syncopal event, this occurred while she was at the cancer center being examined, ultimately felt to be orthostatic in nature.  She had also had a stroke in January 2020, felt to be secondary to severe carotid artery stenosis, prior to her being able to undergo CEA she had a coronary CTA which was elevated and possible stenosis in the LAD so she underwent a left heart cath which ultimately revealed nonobstructive CAD.  She was most recently evaluated by Dr. Leiter on 08/31/2022, Stable from a cardiac perspective, she had been intolerant of Repatha  and was changed to Zetia , advised to follow-up in 1 year.  She presents today for follow-up of her CAD and carotid artery stenosis.  She has a lot of musculoskeletal pain, she is very sedentary.  Her only complaint today is chest pain that occurs a few times a week, typically in the evenings after a particularly stressful day, feels like a  stabbing sensation that will last for a few minutes, on occasion she  has taken nitroglycerin  and it has relieved her pain.  During another physically active times throughout the day she does not experience this.  She uses CPAP at night with oxygen, she is compliant.  She follows closely with her PCP, seeing her at least once a month for pain management. She denies palpitations, dyspnea, pnd, orthopnea, n, v, dizziness, syncope, edema, weight gain, or early satiety.   ROS: Review of Systems  Cardiovascular:  Positive for chest pain.  Musculoskeletal:  Positive for back pain and joint pain.  All other systems reviewed and are negative.    Studies Reviewed: .        Cardiac Studies & Procedures   ______________________________________________________________________________________________ CARDIAC CATHETERIZATION  CARDIAC CATHETERIZATION 01/03/2019  Conclusion  Widely patent left main  LAD is tortuous and contains segmental mid vessel 40 to 50% narrowing.  No focal high-grade obstruction is noted.  Second diagonal is large and also free of significant obstruction.  A large ramus intermedius branch contains mid vessel irregularity but no high-grade obstruction.  The circumflex system is relatively small in distribution.  No significant obstruction.  The right coronary is dominant.  Diffuse irregularities up to 40% from proximal to distal vessel.  PDA contains eccentric 60% narrowing in the mid third.  LVEF greater than 60% with EDP greater than 25 to 30 mmHg.  RECOMMENDATIONS:   Angiographic images of the heart did not identify a focal or significant obstructive lesion.  Therefore, therapy should be with medication/risk factor modification.  Proceed with the planned carotid surgical procedure.  Findings Coronary Findings Diagnostic  Dominance: Right  Left Anterior Descending Prox LAD lesion is 40% stenosed.  First Diagonal Branch Vessel is small in size.  First Septal Branch Vessel is small in size.  Ramus Intermedius Ramus lesion is 25%  stenosed.  Right Coronary Artery Prox RCA to Mid RCA lesion is 40% stenosed.  Right Posterior Descending Artery RPDA lesion is 60% stenosed.  Intervention  No interventions have been documented.     ECHOCARDIOGRAM  ECHOCARDIOGRAM COMPLETE 01/02/2019  Narrative *[place]* *[facility]* [address] [place], [place] [address] [phone]  ------------------------------------------------------------------- Transthoracic Echocardiography  Patient:    Madison, Medina MR #:       [id number] Study Date: 01/02/2019 Gender:     F Age:        57 Height:     162.6 cm Weight:     121.7 kg BSA:        2.42 m^2 Pt. Status: Room:       3W15C  ORDERING     Archer Alm CROME REFERRING    Rinehuls, Alm CROME DARON Cris, Inpatient SONOGRAPHER  [facility] ADMITTING    Jonel Lonni SQUIBB REFERRING    Macdonald, Lauren ATTENDING    Shona Laurence N  cc:  ------------------------------------------------------------------- LV EF: 55%  ------------------------------------------------------------------- Indications:      CVA 436.  ------------------------------------------------------------------- History:   PMH:   Chronic obstructive pulmonary disease.  Risk factors:  Current tobacco use. Hypertension. Morbidly obese. Dyslipidemia.  ------------------------------------------------------------------- Study Conclusions  - Left ventricle: The cavity size was normal. Wall thickness was increased in a pattern of mild LVH. The estimated ejection fraction was 55%. Although no diagnostic regional wall motion abnormality was identified, this possibility cannot be completely excluded on the basis of this study. Features are consistent with a pseudonormal left ventricular filling pattern, with concomitant abnormal relaxation and increased filling pressure (grade 2 diastolic dysfunction). - Aortic valve: There was no stenosis. - Mitral valve: There was no  significant regurgitation. - Right ventricle: Poorly visualized. The cavity size was normal. Systolic function was mildly reduced. - Tricuspid valve: Poorly visualized. - Pulmonary arteries: No complete TR doppler jet so unable to estimate PA systolic pressure. - Systemic veins: IVC measured 2.0 cm with < 50% respirophasic variation, suggesting RA pressure 8 mmHg.  Impressions:  - Technically difficult study with poor acoustic windows. Normal LV size with mild LV hypertrophy. EF 55%. Moderate diastolic dysfunction. Normal RV size with mildly decreased systolic function. No significant valvular abnormalities.  ------------------------------------------------------------------- Study data:  No prior study was available for comparison.  Study status:  Routine.  Procedure:  The patient reported no pain pre or post test. Transthoracic echocardiography. Image quality was suboptimal. The study was technically difficult, as a result of body habitus. Intravenous contrast (Definity ) was administered. Study completion:  There were no complications. Transthoracic echocardiography.  M-mode, complete 2D, spectral Doppler, and color Doppler.  Birthdate:  Patient birthdate: 01-Jun-1961.  Age:  Patient is 63 yr old.  Sex:  Gender: female. BMI: 46 kg/m^2.  Blood pressure:     137/46  Patient status: Inpatient.  Study date:  Study date: 01/02/2019. Study time: 10:56 AM.  Location:  Bedside.  -------------------------------------------------------------------  ------------------------------------------------------------------- Left ventricle:  The cavity size was normal. Wall thickness was increased in a pattern of mild LVH. The estimated ejection fraction was 55%. Although no diagnostic regional wall motion abnormality was identified, this possibility cannot be completely excluded on the basis of this study. Features are consistent with a pseudonormal left ventricular  filling pattern, with  concomitant abnormal relaxation and increased filling pressure (grade 2 diastolic dysfunction).  ------------------------------------------------------------------- Aortic valve:   Trileaflet; mildly calcified leaflets.  Doppler: There was no stenosis.   There was no regurgitation.  ------------------------------------------------------------------- Aorta:  Aortic root: The aortic root was normal in size. Ascending aorta: The ascending aorta was normal in size.  ------------------------------------------------------------------- Mitral valve:   Normal thickness leaflets .  Doppler:   There was no evidence for stenosis.   There was no significant regurgitation. Valve area by pressure half-time: 4.31 cm^2. Indexed valve area by pressure half-time: 1.78 cm^2/m^2.  ------------------------------------------------------------------- Left atrium:  The atrium was normal in size.  ------------------------------------------------------------------- Right ventricle:  Poorly visualized. The cavity size was normal. Systolic function was mildly reduced.  ------------------------------------------------------------------- Pulmonic valve:    Structurally normal valve.   Cusp separation was normal.  Doppler:  Transvalvular velocity was within the normal range. There was no regurgitation.  ------------------------------------------------------------------- Tricuspid valve:  Poorly visualized.  ------------------------------------------------------------------- Pulmonary artery:   No complete TR doppler jet so unable to estimate PA systolic pressure.  ------------------------------------------------------------------- Right atrium:  The atrium was normal in size.  ------------------------------------------------------------------- Pericardium:  There was no pericardial effusion.  ------------------------------------------------------------------- Systemic veins:  IVC measured 2.0 cm with <  50% respirophasic variation, suggesting RA pressure 8 mmHg.  ------------------------------------------------------------------- Measurements  Left ventricle                           Value          Reference LV ID, ED, PLAX chordal                  45.3  mm       43 - 52 LV ID, ES, PLAX chordal                  35.3  mm       23 - 38 LV fx shortening, PLAX chordal   (L)     22    %        >=29 LV PW thickness, ED                      12.6  mm       ---------- Stroke volume, 2D                        60    ml       ---------- Stroke volume/bsa, 2D                    25    ml/m^2   ---------- LV ejection fraction, 1-p A4C            46    %        ---------- LV end-diastolic volume, 2-p             98    ml       ---------- LV end-diastolic volume/bsa, 2-p         40    ml/m^2   ---------- LV e&', lateral                           8.38  cm/s     ---------- LV E/e&', lateral  8.39           ---------- LV e&', medial                            6.85  cm/s     ---------- LV E/e&', medial                          10.26          ---------- LV e&', average                           7.62  cm/s     ---------- LV E/e&', average                         9.23           ----------  Ventricular septum                       Value          Reference IVS thickness, ED                        16.1  mm       ----------  LVOT                                     Value          Reference LVOT ID, S                               23    mm       ---------- LVOT area                                4.15  cm^2     ---------- LVOT peak velocity, S                    68.7  cm/s     ---------- LVOT mean velocity, S                    45.2  cm/s     ---------- LVOT VTI, S                              14.5  cm       ---------- LVOT peak gradient, S                    2     mm Hg    ----------  Aorta                                    Value          Reference Aortic root ID, ED                        30    mm       ---------- Ascending aorta ID,  A-P, S               32    mm       ----------  Left atrium                              Value          Reference LA ID, A-P, ES                           37    mm       ---------- LA ID/bsa, A-P                           1.53  cm/m^2   <=2.2 LA volume, S                             54.5  ml       ---------- LA volume/bsa, S                         22.6  ml/m^2   ---------- LA volume, ES, 1-p A4C                   50.5  ml       ---------- LA volume/bsa, ES, 1-p A4C               20.9  ml/m^2   ---------- LA volume, ES, 1-p A2C                   59.2  ml       ---------- LA volume/bsa, ES, 1-p A2C               24.5  ml/m^2   ----------  Mitral valve                             Value          Reference Mitral E-wave peak velocity              70.3  cm/s     ---------- Mitral A-wave peak velocity              43.7  cm/s     ---------- Mitral deceleration time                 173   ms       150 - 230 Mitral pressure half-time                51    ms       ---------- Mitral E/A ratio, peak                   1.6            ---------- Mitral valve area, PHT, DP               4.31  cm^2     ---------- Mitral valve area/bsa, PHT, DP           1.78  cm^2/m^2 ----------  Right atrium  Value          Reference RA ID, S-I, ES, A4C                      44.7  mm       34 - 49 RA area, ES, A4C                         13.3  cm^2     8.3 - 19.5 RA volume, ES, A/L                       30.9  ml       ---------- RA volume/bsa, ES, A/L                   12.8  ml/m^2   ----------  Right ventricle                          Value          Reference TAPSE                                    12.7  mm       ---------- RV s&', lateral, S                        8.81  cm/s     ----------  Legend: (L)  and  (H)  mark values outside specified reference range.  ------------------------------------------------------------------- Prepared  and Electronically Authenticated by  Ezra Shuck, M.D. 2020-01-20T14:23:24    MONITORS  CARDIAC EVENT MONITOR 07/05/2019  Narrative And event monitor was performed for 30 days beginning 06/02/2019 to evaluate syncope.  Baseline rhythm was sinus 93 bpm normal conduction.  The minimum average and maximum heart rates 51, 79 and 116 bpm.  There were no pauses or episodes of sinus node or AV block.  There was no ventricular or supraventricular arrhythmia.  Therefore stable events dizziness shortness of breath and fatigue all associated with sinus rhythm.   Conclusion unremarkable 30-day event monitor   CT SCANS  CT CORONARY FRACTIONAL FLOW RESERVE DATA PREP 01/02/2019  Narrative EXAM: CT FFR ANALYSIS  CLINICAL DATA:  63 year old obese patient with carotid disease and DOE.  FINDINGS: FFRct analysis was performed on the original cardiac CT angiogram dataset. Diagrammatic representation of the FFRct analysis is provided in a separate PDF document in PACS. This dictation was created using the PDF document and an interactive 3D model of the results. 3D model is not available in the EMR/PACS. Normal FFR range is >0.80.  1. Left Main:  No significant stenosis.  2. LAD: Proximal: 0.91, mid 0.81, distal: 0.8. 3. LCX: No significant stenosis. 4. RCA: No significant stenosis.  IMPRESSION: 1. CT FFR showed borderline stenosis in the proximal to mid LAD. Given that is it proximal LAD a cardiac catheterization is recommended.   Electronically Signed By: Leim Moose On: 01/03/2019 09:12   CT CORONARY MORPH W/CTA COR W/SCORE 01/02/2019  Addendum 01/02/2019  5:56 PM ADDENDUM REPORT: 01/02/2019 17:53  CLINICAL DATA:  63 year old female with h/o carotid disease, morbid obesity and DOE.  EXAM: Cardiac/Coronary  CT  TECHNIQUE: The patient was scanned on a [employer].  FINDINGS: A 120 kV prospective scan was triggered in the descending thoracic  aorta  at 111 HU's. Axial non-contrast 3 mm slices were carried out through the heart. The data set was analyzed on a dedicated work station and scored using the Agatson method. Gantry rotation speed was 250 msecs and collimation was .6 mm. No beta blockade and 0.8 mg of sl NTG was given. The 3D data set was reconstructed in 5% intervals of the 67-82 % of the R-R cycle. Diastolic phases were analyzed on a dedicated work station using MPR, MIP and VRT modes. The patient received 80 cc of contrast.  Aorta: Normal size. Mild diffuse atherosclerotic plaque and calcifications. No dissection.  Aortic Valve:  Trileaflet.  No calcifications.  Coronary Arteries:  Normal coronary origin.  Right dominance.  RCA is a large dominant artery that gives rise to PDA and PLA. There is moderate diffuse mixed plaque with focal stenoses 50-60% in the mid and distal RCA, proximal PLA and mid PDA.  Left main is a large artery that gives rise to LAD, a ramus intermedius and LCX arteries. Left main has no obvious plaque.  LAD is a large vessel that gives rise to two small diagonal arteries. Proximal LAD has long diffuse moderate calcified plaque with stenosis 50-69%. This is followed by a non-calcified plaque in the mid LAD with suspicion for stenosis > 70%. Distal LAD has only mild plaque.  D1,2 are small with mild plaque.  Ramus intermedius is a medium size artery with moderate calcified plaque in the proximal portion with stenosis 50-69%.  LCX is a small caliber non-dominant artery that has mild diffuse plaque.  Other findings:  Normal pulmonary vein drainage into the left atrium.  Normal let atrial appendage without a thrombus.  Normal size of the pulmonary artery.  IMPRESSION: 1. Coronary calcium  score of 719. This was 93 percentile for age and sex matched control.  2. Normal coronary origin with right dominance.  3. Moderate to severe CAD with moderate stenosis in the mid and distal RCA,  proximal PLA, mid PDA, proximal LAD and suspicion for severe stenosis in the mid LAD. Additional analysis with CT FFR will be submitted.   Electronically Signed By: Leim Moose On: 01/02/2019 17:53  Narrative EXAM: OVER-READ INTERPRETATION  CT CHEST  The following report is an over-read performed by radiologist Dr. Rockey Kilts of [facility] on 01/02/2019. This over-read does not include interpretation of cardiac or coronary anatomy or pathology. The coronary CTA interpretation by the cardiologist is attached.  COMPARISON:  Chest radiograph 12/29/2018. Complete chest CT of 06/04/2011.  FINDINGS: Vascular: Aortic atherosclerosis. No central pulmonary embolism, on this non-dedicated study.  Mediastinum/Nodes: No imaged thoracic adenopathy.  Lungs/Pleura: No imaged pleural fluid. Probable scarring at the left lung base. An area of nodularity including image 48/14 is similar (given differences in technique) to the 06/04/2011 exam and can be presumed benign.  Upper Abdomen: Normal imaged portions of the liver, spleen, stomach.  Musculoskeletal: No acute osseous abnormality.  IMPRESSION: 1.  No acute findings in the imaged extracardiac chest. 2.  Aortic Atherosclerosis (ICD10-I70.0).  Electronically Signed: By: Rockey Kilts M.D. On: 01/02/2019 14:50     ______________________________________________________________________________________________      Risk Assessment/Calculations:            Physical Exam:   VS:  There were no vitals taken for this visit.   Wt Readings from Last 3 Encounters:  04/18/24 286 lb (129.7 kg)  12/25/22 281 lb 3.2 oz (127.6 kg)  08/31/22 282 lb (127.9 kg)    GEN:  Well nourished, well developed in no acute distress NECK: No JVD; No carotid bruits CARDIAC: RRR, no murmurs, rubs, gallops RESPIRATORY:  Clear to auscultation without rales, wheezing or rhonchi  ABDOMEN: Soft, non-tender, non-distended EXTREMITIES:  No  edema; No deformity   ASSESSMENT AND PLAN: .   CAD-nonobstructive per left heart cath in 2020.  She has having episodes of atypical chest pain however has been relieved with nitroglycerin .  Her EKG is without changes today.  I will increase her Imdur  to 60 mg daily.  Continue Norvasc 5 mg daily, continue aspirin  81 mg daily, continue Plavix 75 mg daily, Crestor  40 mg daily, Continue nitroglycerin  as needed.  Carotid artery stenosis-s/p left CEA in 2020 >> duplex in 2022 revealed occlusion of her right CEA and mild stenosis in left CEA.  Will repeat carotid duplex.  Continue Plavix 75 mg daily, aspirin  81 mg daily, Crestor  40 mg daily.   Hypertension-blood pressure was elevated this morning at 140/80 however appointment at 8 AM she has not taken her lisinopril yet.  Not make any changes at this time.  Continue lisinopril 20 mg daily.  Dyslipidemia-is formally monitored by her PCP and she states it was checked around January of this year, will request lab work from her PCP.  Currently on Crestor  40 mg daily.    Palpitations-quiescent  History of stroke-on Plavix and Crestor .       Dispo: Increase Imdur  to 60 mg daily, request lab work from PCP, follow-up in 3 months to see if her symptoms are improved after increasing her Imdur .  Signed, Delon JAYSON Hoover, NP

## 2024-07-19 ENCOUNTER — Encounter: Payer: Self-pay | Admitting: Cardiology

## 2024-07-19 ENCOUNTER — Ambulatory Visit: Attending: Cardiology | Admitting: Cardiology

## 2024-07-19 VITALS — BP 150/80 | HR 78 | Ht 64.0 in | Wt 287.0 lb

## 2024-07-19 DIAGNOSIS — E782 Mixed hyperlipidemia: Secondary | ICD-10-CM | POA: Diagnosis not present

## 2024-07-19 DIAGNOSIS — I2510 Atherosclerotic heart disease of native coronary artery without angina pectoris: Secondary | ICD-10-CM

## 2024-07-19 DIAGNOSIS — R002 Palpitations: Secondary | ICD-10-CM

## 2024-07-19 DIAGNOSIS — I6523 Occlusion and stenosis of bilateral carotid arteries: Secondary | ICD-10-CM

## 2024-07-19 DIAGNOSIS — G4733 Obstructive sleep apnea (adult) (pediatric): Secondary | ICD-10-CM

## 2024-07-19 DIAGNOSIS — I10 Essential (primary) hypertension: Secondary | ICD-10-CM

## 2024-07-19 DIAGNOSIS — I63232 Cerebral infarction due to unspecified occlusion or stenosis of left carotid arteries: Secondary | ICD-10-CM

## 2024-07-19 NOTE — Patient Instructions (Signed)
 Medication Instructions:   *If you need a refill on your cardiac medications before your next appointment, please call your pharmacy*  Lab Work:  If you have labs (blood work) drawn today and your tests are completely normal, you will receive your results only by: MyChart Message (if you have MyChart) OR A paper copy in the mail If you have any lab test that is abnormal or we need to change your treatment, we will call you to review the results.  Testing/Procedures:   Follow-Up: At [facility], you and your health needs are our priority.  As part of our continuing mission to provide you with exceptional heart care, our providers are all part of one team.  This team includes your primary Cardiologist (physician) and Advanced Practice Providers or APPs (Physician Assistants and Nurse Practitioners) who all work together to provide you with the care you need, when you need it.  Your next appointment:   1 year(s)  Provider:   Zoe Hinds, MD    We recommend signing up for the patient portal called "MyChart".  Sign up information is provided on this After Visit Summary.  MyChart is used to connect with patients for Virtual Visits (Telemedicine).  Patients are able to view lab/test results, encounter notes, upcoming appointments, etc.  Non-urgent messages can be sent to your provider as well.   To learn more about what you can do with MyChart, go to [email or web address].   Other Instructions   Have a great year!!

## 2024-08-25 ENCOUNTER — Encounter: Payer: Self-pay | Admitting: Cardiology

## 2024-08-25 ENCOUNTER — Ambulatory Visit: Attending: Cardiology | Admitting: Cardiology

## 2024-08-25 VITALS — BP 150/82 | HR 72 | Ht 64.0 in | Wt 296.6 lb

## 2024-08-25 DIAGNOSIS — E782 Mixed hyperlipidemia: Secondary | ICD-10-CM | POA: Diagnosis not present

## 2024-08-25 DIAGNOSIS — I2510 Atherosclerotic heart disease of native coronary artery without angina pectoris: Secondary | ICD-10-CM | POA: Diagnosis not present

## 2024-08-25 DIAGNOSIS — I10 Essential (primary) hypertension: Secondary | ICD-10-CM

## 2024-08-25 DIAGNOSIS — R0602 Shortness of breath: Secondary | ICD-10-CM

## 2024-08-25 DIAGNOSIS — I6523 Occlusion and stenosis of bilateral carotid arteries: Secondary | ICD-10-CM | POA: Diagnosis not present

## 2024-08-25 DIAGNOSIS — R0609 Other forms of dyspnea: Secondary | ICD-10-CM

## 2024-08-25 NOTE — Patient Instructions (Signed)
 Medication Instructions:   No changes today   *If you need a refill on your cardiac medications before your next appointment, please call your pharmacy*  Lab Work:  CMET, ProBNP   If you have labs (blood work) drawn today and your tests are completely normal, you will receive your results only by: MyChart Message (if you have MyChart) OR A paper copy in the mail If you have any lab test that is abnormal or we need to change your treatment, we will call you to review the results.  Testing/Procedures: Your physician has requested that you have an echocardiogram. Echocardiography is a painless test that uses sound waves to create images of your heart. It provides your doctor with information about the size and shape of your heart and how well your heart's chambers and valves are working. This procedure takes approximately one hour. There are no restrictions for this procedure. Please do NOT wear cologne, perfume, aftershave, or lotions (deodorant is allowed). Please arrive 15 minutes prior to your appointment time.  Please note: We ask at that you not bring children with you during ultrasound (echo/ vascular) testing. Due to room size and safety concerns, children are not allowed in the ultrasound rooms during exams. Our front office staff cannot provide observation of children in our lobby area while testing is being conducted. An adult accompanying a patient to their appointment will only be allowed in the ultrasound room at the discretion of the ultrasound technician under special circumstances. We apologize for any inconvenience.   Follow-Up: At [facility], you and your health needs are our priority.  As part of our continuing mission to provide you with exceptional heart care, our providers are all part of one team.  This team includes your primary Cardiologist (physician) and Advanced Practice Providers or APPs (Physician Assistants and Nurse Practitioners) who all work  together to provide you with the care you need, when you need it.  Your next appointment:   3 month(s)  Provider:   Redell Leiter, MD    We recommend signing up for the patient portal called MyChart.  Sign up information is provided on this After Visit Summary.  MyChart is used to connect with patients for Virtual Visits (Telemedicine).  Patients are able to view lab/test results, encounter notes, upcoming appointments, etc.  Non-urgent messages can be sent to your provider as well.   To learn more about what you can do with MyChart, go to [email or web address].   Other Instructions

## 2024-08-25 NOTE — Progress Notes (Signed)
 Cardiology Office Note:  .   Date:  08/25/2024  ID:  Madison Medina, DOB [birth year], MRN [id number] PCP: Servando Leeroy Ground, NP  [facility] Providers Cardiologist:  Redell Leiter, MD Cardiology APP:  Carlin Delon BROCKS, NP    History of Present Illness: .   Madison Medina is a 63 y.o. female with a past medical history of carotid artery disease s/p left CEA 2020, nonobstructive CAD, history of breast cancer s/p radiation, palpitations, hypertension, history of stroke, history of tobacco abuse, OSA on CPAP--followed by Dr. Mardee.  04/21/2024 carotid duplex total occlusion of the right ICA, left ICA mild stenosis, no change. 07/29/2021 carotid duplex total occlusion of the right ICA, left ICA mild stenosis 05/10/2019 echo Grossly normal LV size and systolic function, valves were not well-visualized, technically difficult study 01/04/2019 left CEA 01/03/2019 cardiac catheterization mild nonobstructive CAD 01/01/2019 coronary CTA calcium  score 719, 99th percentile, FFR possible stenosis in LAD with recommendations for left heart cath  [facility] following a syncopal event, this occurred while she was at the cancer center being examined, ultimately felt to be orthostatic in nature.  She had also had a stroke in January 2020, felt to be secondary to severe carotid artery stenosis, prior to her being able to undergo CEA she had a coronary CTA which was elevated and possible stenosis in the LAD so she underwent a left heart cath which ultimately revealed nonobstructive CAD.  Evaluated by Dr. Leiter on 08/31/2022, Stable from a cardiac perspective, she had been intolerant of Repatha  and was changed to Zetia , advised to follow-up in 1 year.  Evaluated by myself on 04/18/2024, doing ok at that time but having some chest pain, we increased her Imdur  and made plans to follow up in 3 months for symptoms.  Most recently evaluated by myself on 07/19/2024, she had not had further episodes of  chest pain, she had been successful in tobacco cessation and her breathing was improved, follows closely with pulmonologist as well as her pain management specialist, plans to follow-up in 1 year.  She presents today as after she was last seen in our office a month ago she was evaluated by her pulmonologist, apparently she had had a CT of her chest revealing coronary artery calcifications and he wanted her to undergo repeat echocardiogram and stress evaluation so we brought her in the office today to see if she was having any further symptoms.  She has not had any further episodes of chest pain, she has known coronary artery disease and is not currently having any anginal symptoms since we previously adjusted her Imdur .  Known shortness of breath however this is at baseline and not any worse for her.She denies chest pain, palpitations, pnd, orthopnea, n, v, dizziness, syncope, edema, weight gain, or early satiety.       ROS: Review of Systems  Respiratory:  Positive for shortness of breath (baseline).   Musculoskeletal:  Positive for back pain and joint pain.  All other systems reviewed and are negative.    Studies Reviewed: .        Cardiac Studies & Procedures   ______________________________________________________________________________________________ CARDIAC CATHETERIZATION  CARDIAC CATHETERIZATION 01/03/2019  Conclusion  Widely patent left main  LAD is tortuous and contains segmental mid vessel 40 to 50% narrowing.  No focal high-grade obstruction is noted.  Second diagonal is large and also free of significant obstruction.  A large ramus intermedius branch contains mid vessel irregularity but no high-grade obstruction.  The circumflex system is relatively small in distribution.  No significant obstruction.  The right coronary is dominant.  Diffuse irregularities up to 40% from proximal to distal vessel.  PDA contains eccentric 60% narrowing in the mid third.  LVEF greater  than 60% with EDP greater than 25 to 30 mmHg.  RECOMMENDATIONS:   Angiographic images of the heart did not identify a focal or significant obstructive lesion.  Therefore, therapy should be with medication/risk factor modification.  Proceed with the planned carotid surgical procedure.  Findings Coronary Findings Diagnostic  Dominance: Right  Left Anterior Descending Prox LAD lesion is 40% stenosed.  First Diagonal Branch Vessel is small in size.  First Septal Branch Vessel is small in size.  Ramus Intermedius Ramus lesion is 25% stenosed.  Right Coronary Artery Prox RCA to Mid RCA lesion is 40% stenosed.  Right Posterior Descending Artery RPDA lesion is 60% stenosed.  Intervention  No interventions have been documented.     ECHOCARDIOGRAM  ECHOCARDIOGRAM COMPLETE 01/02/2019  Narrative *[place]* *[facility]* [address] [place], [place] [address] [phone]  ------------------------------------------------------------------- Transthoracic Echocardiography  Patient:    Madison Medina MR #:       [id number] Study Date: 01/02/2019 Gender:     F Age:        57 Height:     162.6 cm Weight:     121.7 kg BSA:        2.42 m^2 Pt. Status: Room:       3W15C  ORDERING     Archer Alm CROME REFERRING    Rinehuls, Alm CROME DARON Cris, Inpatient SONOGRAPHER  [facility] ADMITTING    Jonel Lonni SQUIBB REFERRING    Macdonald, Lauren ATTENDING    Shona Laurence N  cc:  ------------------------------------------------------------------- LV EF: 55%  ------------------------------------------------------------------- Indications:      CVA 436.  ------------------------------------------------------------------- History:   PMH:   Chronic obstructive pulmonary disease.  Risk factors:  Current tobacco use. Hypertension. Morbidly obese. Dyslipidemia.  ------------------------------------------------------------------- Study  Conclusions  - Left ventricle: The cavity size was normal. Wall thickness was increased in a pattern of mild LVH. The estimated ejection fraction was 55%. Although no diagnostic regional wall motion abnormality was identified, this possibility cannot be completely excluded on the basis of this study. Features are consistent with a pseudonormal left ventricular filling pattern, with concomitant abnormal relaxation and increased filling pressure (grade 2 diastolic dysfunction). - Aortic valve: There was no stenosis. - Mitral valve: There was no significant regurgitation. - Right ventricle: Poorly visualized. The cavity size was normal. Systolic function was mildly reduced. - Tricuspid valve: Poorly visualized. - Pulmonary arteries: No complete TR doppler jet so unable to estimate PA systolic pressure. - Systemic veins: IVC measured 2.0 cm with < 50% respirophasic variation, suggesting RA pressure 8 mmHg.  Impressions:  - Technically difficult study with poor acoustic windows. Normal LV size with mild LV hypertrophy. EF 55%. Moderate diastolic dysfunction. Normal RV size with mildly decreased systolic function. No significant valvular abnormalities.  ------------------------------------------------------------------- Study data:  No prior study was available for comparison.  Study status:  Routine.  Procedure:  The patient reported no pain pre or post test. Transthoracic echocardiography. Image quality was suboptimal. The study was technically difficult, as a result of body habitus. Intravenous contrast (Definity ) was administered. Study completion:  There were no complications. Transthoracic echocardiography.  M-mode, complete 2D, spectral Doppler, and color Doppler.  Birthdate:  Patient birthdate: 04-07-1961.  Age:  Patient  is 63 yr old.  Sex:  Gender: female. BMI: 46 kg/m^2.  Blood pressure:     137/46  Patient status: Inpatient.  Study date:  Study date: 01/02/2019. Study time:  10:56 AM.  Location:  Bedside.  -------------------------------------------------------------------  ------------------------------------------------------------------- Left ventricle:  The cavity size was normal. Wall thickness was increased in a pattern of mild LVH. The estimated ejection fraction was 55%. Although no diagnostic regional wall motion abnormality was identified, this possibility cannot be completely excluded on the basis of this study. Features are consistent with a pseudonormal left ventricular filling pattern, with concomitant abnormal relaxation and increased filling pressure (grade 2 diastolic dysfunction).  ------------------------------------------------------------------- Aortic valve:   Trileaflet; mildly calcified leaflets.  Doppler: There was no stenosis.   There was no regurgitation.  ------------------------------------------------------------------- Aorta:  Aortic root: The aortic root was normal in size. Ascending aorta: The ascending aorta was normal in size.  ------------------------------------------------------------------- Mitral valve:   Normal thickness leaflets .  Doppler:   There was no evidence for stenosis.   There was no significant regurgitation. Valve area by pressure half-time: 4.31 cm^2. Indexed valve area by pressure half-time: 1.78 cm^2/m^2.  ------------------------------------------------------------------- Left atrium:  The atrium was normal in size.  ------------------------------------------------------------------- Right ventricle:  Poorly visualized. The cavity size was normal. Systolic function was mildly reduced.  ------------------------------------------------------------------- Pulmonic valve:    Structurally normal valve.   Cusp separation was normal.  Doppler:  Transvalvular velocity was within the normal range. There was no  regurgitation.  ------------------------------------------------------------------- Tricuspid valve:  Poorly visualized.  ------------------------------------------------------------------- Pulmonary artery:   No complete TR doppler jet so unable to estimate PA systolic pressure.  ------------------------------------------------------------------- Right atrium:  The atrium was normal in size.  ------------------------------------------------------------------- Pericardium:  There was no pericardial effusion.  ------------------------------------------------------------------- Systemic veins:  IVC measured 2.0 cm with < 50% respirophasic variation, suggesting RA pressure 8 mmHg.  ------------------------------------------------------------------- Measurements  Left ventricle                           Value          Reference LV ID, ED, PLAX chordal                  45.3  mm       43 - 52 LV ID, ES, PLAX chordal                  35.3  mm       23 - 38 LV fx shortening, PLAX chordal   (L)     22    %        >=29 LV PW thickness, ED                      12.6  mm       ---------- Stroke volume, 2D                        60    ml       ---------- Stroke volume/bsa, 2D                    25    ml/m^2   ---------- LV ejection fraction, 1-p A4C            46    %        ---------- LV end-diastolic volume, 2-p  98    ml       ---------- LV end-diastolic volume/bsa, 2-p         40    ml/m^2   ---------- LV e&', lateral                           8.38  cm/s     ---------- LV E/e&', lateral                         8.39           ---------- LV e&', medial                            6.85  cm/s     ---------- LV E/e&', medial                          10.26          ---------- LV e&', average                           7.62  cm/s     ---------- LV E/e&', average                         9.23           ----------  Ventricular septum                       Value          Reference IVS  thickness, ED                        16.1  mm       ----------  LVOT                                     Value          Reference LVOT ID, S                               23    mm       ---------- LVOT area                                4.15  cm^2     ---------- LVOT peak velocity, S                    68.7  cm/s     ---------- LVOT mean velocity, S                    45.2  cm/s     ---------- LVOT VTI, S                              14.5  cm       ---------- LVOT peak gradient, S                    2  mm Hg    ----------  Aorta                                    Value          Reference Aortic root ID, ED                       30    mm       ---------- Ascending aorta ID, A-P, S               32    mm       ----------  Left atrium                              Value          Reference LA ID, A-P, ES                           37    mm       ---------- LA ID/bsa, A-P                           1.53  cm/m^2   <=2.2 LA volume, S                             54.5  ml       ---------- LA volume/bsa, S                         22.6  ml/m^2   ---------- LA volume, ES, 1-p A4C                   50.5  ml       ---------- LA volume/bsa, ES, 1-p A4C               20.9  ml/m^2   ---------- LA volume, ES, 1-p A2C                   59.2  ml       ---------- LA volume/bsa, ES, 1-p A2C               24.5  ml/m^2   ----------  Mitral valve                             Value          Reference Mitral E-wave peak velocity              70.3  cm/s     ---------- Mitral A-wave peak velocity              43.7  cm/s     ---------- Mitral deceleration time                 173   ms       150 - 230 Mitral pressure half-time                51    ms       ---------- Mitral E/A ratio, peak  1.6            ---------- Mitral valve area, PHT, DP               4.31  cm^2     ---------- Mitral valve area/bsa, PHT, DP           1.78  cm^2/m^2 ----------  Right atrium                             Value           Reference RA ID, S-I, ES, A4C                      44.7  mm       34 - 49 RA area, ES, A4C                         13.3  cm^2     8.3 - 19.5 RA volume, ES, A/L                       30.9  ml       ---------- RA volume/bsa, ES, A/L                   12.8  ml/m^2   ----------  Right ventricle                          Value          Reference TAPSE                                    12.7  mm       ---------- RV s&', lateral, S                        8.81  cm/s     ----------  Legend: (L)  and  (H)  mark values outside specified reference range.  ------------------------------------------------------------------- Prepared and Electronically Authenticated by  Ezra Shuck, M.D. 2020-01-20T14:23:24    MONITORS  CARDIAC EVENT MONITOR 07/05/2019  Narrative And event monitor was performed for 30 days beginning 06/02/2019 to evaluate syncope.  Baseline rhythm was sinus 93 bpm normal conduction.  The minimum average and maximum heart rates 51, 79 and 116 bpm.  There were no pauses or episodes of sinus node or AV block.  There was no ventricular or supraventricular arrhythmia.  Therefore stable events dizziness shortness of breath and fatigue all associated with sinus rhythm.   Conclusion unremarkable 30-day event monitor   CT SCANS  CT CORONARY FRACTIONAL FLOW RESERVE DATA PREP 01/02/2019  Narrative EXAM: CT FFR ANALYSIS  CLINICAL DATA:  63 year old obese patient with carotid disease and DOE.  FINDINGS: FFRct analysis was performed on the original cardiac CT angiogram dataset. Diagrammatic representation of the FFRct analysis is provided in a separate PDF document in PACS. This dictation was created using the PDF document and an interactive 3D model of the results. 3D model is not available in the EMR/PACS. Normal FFR range is >0.80.  1. Left Main:  No significant stenosis.  2. LAD: Proximal: 0.91, mid 0.81, distal: 0.8. 3. LCX: No significant stenosis. 4.  RCA: No significant stenosis.  IMPRESSION: 1. CT FFR showed borderline  stenosis in the proximal to mid LAD. Given that is it proximal LAD a cardiac catheterization is recommended.   Electronically Signed By: Leim Moose On: 01/03/2019 09:12   CT CORONARY MORPH W/CTA COR W/SCORE 01/02/2019  Addendum 01/02/2019  5:56 PM ADDENDUM REPORT: 01/02/2019 17:53  CLINICAL DATA:  63 year old female with h/o carotid disease, morbid obesity and DOE.  EXAM: Cardiac/Coronary  CT  TECHNIQUE: The patient was scanned on a [employer].  FINDINGS: A 120 kV prospective scan was triggered in the descending thoracic aorta at 111 HU's. Axial non-contrast 3 mm slices were carried out through the heart. The data set was analyzed on a dedicated work station and scored using the Agatson method. Gantry rotation speed was 250 msecs and collimation was .6 mm. No beta blockade and 0.8 mg of sl NTG was given. The 3D data set was reconstructed in 5% intervals of the 67-82 % of the R-R cycle. Diastolic phases were analyzed on a dedicated work station using MPR, MIP and VRT modes. The patient received 80 cc of contrast.  Aorta: Normal size. Mild diffuse atherosclerotic plaque and calcifications. No dissection.  Aortic Valve:  Trileaflet.  No calcifications.  Coronary Arteries:  Normal coronary origin.  Right dominance.  RCA is a large dominant artery that gives rise to PDA and PLA. There is moderate diffuse mixed plaque with focal stenoses 50-60% in the mid and distal RCA, proximal PLA and mid PDA.  Left main is a large artery that gives rise to LAD, a ramus intermedius and LCX arteries. Left main has no obvious plaque.  LAD is a large vessel that gives rise to two small diagonal arteries. Proximal LAD has long diffuse moderate calcified plaque with stenosis 50-69%. This is followed by a non-calcified plaque in the mid LAD with suspicion for stenosis > 70%. Distal LAD has only mild  plaque.  D1,2 are small with mild plaque.  Ramus intermedius is a medium size artery with moderate calcified plaque in the proximal portion with stenosis 50-69%.  LCX is a small caliber non-dominant artery that has mild diffuse plaque.  Other findings:  Normal pulmonary vein drainage into the left atrium.  Normal let atrial appendage without a thrombus.  Normal size of the pulmonary artery.  IMPRESSION: 1. Coronary calcium  score of 719. This was 46 percentile for age and sex matched control.  2. Normal coronary origin with right dominance.  3. Moderate to severe CAD with moderate stenosis in the mid and distal RCA, proximal PLA, mid PDA, proximal LAD and suspicion for severe stenosis in the mid LAD. Additional analysis with CT FFR will be submitted.   Electronically Signed By: Leim Moose On: 01/02/2019 17:53  Narrative EXAM: OVER-READ INTERPRETATION  CT CHEST  The following report is an over-read performed by radiologist Dr. Rockey Kilts of [facility] on 01/02/2019. This over-read does not include interpretation of cardiac or coronary anatomy or pathology. The coronary CTA interpretation by the cardiologist is attached.  COMPARISON:  Chest radiograph 12/29/2018. Complete chest CT of 06/04/2011.  FINDINGS: Vascular: Aortic atherosclerosis. No central pulmonary embolism, on this non-dedicated study.  Mediastinum/Nodes: No imaged thoracic adenopathy.  Lungs/Pleura: No imaged pleural fluid. Probable scarring at the left lung base. An area of nodularity including image 48/14 is similar (given differences in technique) to the 06/04/2011 exam and can be presumed benign.  Upper Abdomen: Normal imaged portions of the liver, spleen, stomach.  Musculoskeletal: No acute osseous abnormality.  IMPRESSION: 1.  No acute findings  in the imaged extracardiac chest. 2.  Aortic Atherosclerosis (ICD10-I70.0).  Electronically Signed: By: Rockey Kilts  M.D. On: 01/02/2019 14:50     ______________________________________________________________________________________________      Risk Assessment/Calculations:            Physical Exam:   VS:  There were no vitals taken for this visit.   Wt Readings from Last 3 Encounters:  07/19/24 287 lb (130.2 kg)  04/18/24 286 lb (129.7 kg)  12/25/22 281 lb 3.2 oz (127.6 kg)    GEN: Well nourished, well developed in no acute distress NECK: No JVD; No carotid bruits CARDIAC: RRR, no murmurs, rubs, gallops RESPIRATORY:  Clear to auscultation without rales, wheezing or rhonchi  ABDOMEN: Soft, non-tender, non-distended EXTREMITIES:  No edema; No deformity   ASSESSMENT AND PLAN: .   CAD-nonobstructive per left heart cath in 2020.  Stable with no anginal symptoms. No indication for ischemic evaluation.  Continue Norvasc 10 mg daily, continue aspirin  81 mg daily, continue Plavix 75 mg daily, Crestor  40 mg daily, continue Imdur  30 mg daily, continue nitroglycerin  as needed. Heart healthy diet and regular cardiovascular exercise encouraged.    Shortness of breath-this has been persistent and ongoing for many years, she follows with a pulmonologist however they apparently had some concerns and wanted to do repeat testing including echocardiogram and a stress evaluation.  Overall she appears to be at baseline but we will repeat an echocardiogram, c-Met and proBNP.  Carotid artery stenosis-s/p left CEA in 2020 >> duplex 04/2024 revealed occlusion of her right CEA and mild stenosis in left CEA.  Continue Plavix 75 mg daily, aspirin  81 mg daily, Crestor  40 mg daily.   Hypertension-blood pressure was elevated this morning at 150/82 however appointment at 8 AM she has not taken her lisinopril yet.  Not make any changes at this time.  Continue lisinopril 20 mg daily twice daily, amlodipine 10 mg daily.  Dyslipidemia-is formally monitored by her PCP and she states it was checked around January of this year, will  request lab work from her PCP.  Currently on Crestor  40 mg daily.  Ideally, her LDL should be < 70.   Palpitations-quiescent  History of stroke-on Plavix and Crestor .  Former tobacco abuse - complete cessation ~ 01/2024.   OSA-compliant with CPAP, follows with Dr. Mardee.       Dispo: Echo, c-Met, proBNP. Follow up in 3 months.   Signed, Delon JAYSON Hoover, NP

## 2024-08-26 LAB — COMPREHENSIVE METABOLIC PANEL WITH GFR
ALT: 25 IU/L (ref 0–32)
AST: 41 IU/L — ABNORMAL HIGH (ref 0–40)
Albumin: 3.8 g/dL — ABNORMAL LOW (ref 3.9–4.9)
Alkaline Phosphatase: 53 IU/L (ref 44–121)
BUN/Creatinine Ratio: 18 (ref 12–28)
BUN: 18 mg/dL (ref 8–27)
Bilirubin Total: 0.3 mg/dL (ref 0.0–1.2)
CO2: 23 mmol/L (ref 20–29)
Calcium: 8.5 mg/dL — ABNORMAL LOW (ref 8.7–10.3)
Chloride: 97 mmol/L (ref 96–106)
Creatinine, Ser: 0.99 mg/dL (ref 0.57–1.00)
Globulin, Total: 4 g/dL (ref 1.5–4.5)
Glucose: 90 mg/dL (ref 70–99)
Potassium: 4.7 mmol/L (ref 3.5–5.2)
Sodium: 136 mmol/L (ref 134–144)
Total Protein: 7.8 g/dL (ref 6.0–8.5)
eGFR: 64 mL/min/1.73

## 2024-08-26 LAB — PRO B NATRIURETIC PEPTIDE: NT-Pro BNP: 103 pg/mL (ref 0–287)

## 2024-08-28 ENCOUNTER — Ambulatory Visit: Payer: Self-pay | Admitting: Cardiology

## 2024-09-20 ENCOUNTER — Ambulatory Visit: Attending: Cardiology

## 2024-09-20 DIAGNOSIS — I10 Essential (primary) hypertension: Secondary | ICD-10-CM

## 2024-09-20 DIAGNOSIS — I2510 Atherosclerotic heart disease of native coronary artery without angina pectoris: Secondary | ICD-10-CM | POA: Diagnosis not present

## 2024-09-20 DIAGNOSIS — R0609 Other forms of dyspnea: Secondary | ICD-10-CM

## 2024-09-20 DIAGNOSIS — E782 Mixed hyperlipidemia: Secondary | ICD-10-CM | POA: Diagnosis not present

## 2024-09-20 DIAGNOSIS — I6523 Occlusion and stenosis of bilateral carotid arteries: Secondary | ICD-10-CM | POA: Diagnosis not present

## 2024-09-20 LAB — ECHOCARDIOGRAM COMPLETE: Area-P 1/2: 2.14 cm2

## 2024-09-20 MED ORDER — PERFLUTREN LIPID MICROSPHERE
1.0000 mL | INTRAVENOUS | Status: AC | PRN
  Administered 2024-09-20: 10 mL via INTRAVENOUS

## 2024-10-04 ENCOUNTER — Telehealth: Payer: Self-pay | Admitting: Cardiology

## 2024-10-04 NOTE — Telephone Encounter (Signed)
 Office is calling asking that we send the results to the echo to there office. Fax #[phone]. Please advise

## 2024-10-06 NOTE — Telephone Encounter (Signed)
 Faxed and confirmation received.

## 2024-10-10 ENCOUNTER — Other Ambulatory Visit: Payer: Self-pay | Admitting: Oncology

## 2024-10-10 DIAGNOSIS — Z170 Estrogen receptor positive status [ER+]: Secondary | ICD-10-CM

## 2024-10-10 DIAGNOSIS — C50411 Malignant neoplasm of upper-outer quadrant of right female breast: Secondary | ICD-10-CM

## 2024-10-11 ENCOUNTER — Telehealth: Payer: Self-pay | Admitting: Oncology

## 2024-10-11 NOTE — Telephone Encounter (Signed)
-----   Message from Wanda VEAR Cornish sent at 10/10/2024  6:35 PM EDT ----- Regarding: appt Dr Joesph is her surgeon so he may have sched her mammo this year.  (See other message) Or we could just start with appt, 11/4 or 11/6 and I could talk to her about scheduling the CT chest low dose

## 2024-10-11 NOTE — Telephone Encounter (Signed)
 Contacted pt to schedule an appt. Unable to reach via phone, voicemail was left.

## 2024-10-19 ENCOUNTER — Inpatient Hospital Stay: Attending: Oncology | Admitting: Oncology

## 2024-10-19 ENCOUNTER — Other Ambulatory Visit: Payer: Self-pay | Admitting: Oncology

## 2024-10-19 ENCOUNTER — Telehealth: Payer: Self-pay | Admitting: Oncology

## 2024-10-19 ENCOUNTER — Encounter: Payer: Self-pay | Admitting: Oncology

## 2024-10-19 VITALS — BP 163/71 | HR 70 | Temp 97.7°F | Resp 19 | Ht 64.0 in | Wt 296.9 lb

## 2024-10-19 DIAGNOSIS — C50412 Malignant neoplasm of upper-outer quadrant of left female breast: Secondary | ICD-10-CM

## 2024-10-19 DIAGNOSIS — C50411 Malignant neoplasm of upper-outer quadrant of right female breast: Secondary | ICD-10-CM | POA: Diagnosis present

## 2024-10-19 DIAGNOSIS — B372 Candidiasis of skin and nail: Secondary | ICD-10-CM

## 2024-10-19 DIAGNOSIS — M8580 Other specified disorders of bone density and structure, unspecified site: Secondary | ICD-10-CM | POA: Insufficient documentation

## 2024-10-19 DIAGNOSIS — Z170 Estrogen receptor positive status [ER+]: Secondary | ICD-10-CM | POA: Diagnosis not present

## 2024-10-19 DIAGNOSIS — Z79899 Other long term (current) drug therapy: Secondary | ICD-10-CM | POA: Diagnosis not present

## 2024-10-19 DIAGNOSIS — M85852 Other specified disorders of bone density and structure, left thigh: Secondary | ICD-10-CM | POA: Diagnosis not present

## 2024-10-19 DIAGNOSIS — Z79811 Long term (current) use of aromatase inhibitors: Secondary | ICD-10-CM | POA: Diagnosis not present

## 2024-10-19 DIAGNOSIS — F17210 Nicotine dependence, cigarettes, uncomplicated: Secondary | ICD-10-CM | POA: Insufficient documentation

## 2024-10-19 DIAGNOSIS — M85832 Other specified disorders of bone density and structure, left forearm: Secondary | ICD-10-CM | POA: Diagnosis not present

## 2024-10-19 DIAGNOSIS — Z7982 Long term (current) use of aspirin: Secondary | ICD-10-CM | POA: Diagnosis not present

## 2024-10-19 MED ORDER — NYSTATIN 100000 UNIT/GM EX POWD: 1.0000 | Freq: Three times a day (TID) | CUTANEOUS | 5 refills | Status: AC

## 2024-10-19 NOTE — Telephone Encounter (Signed)
 Patient has been scheduled for follow-up visit per 10/19/24 LOS.  Pt given an appt calendar with date and time.

## 2024-10-19 NOTE — Progress Notes (Signed)
 [facility]  [address] [place],  [place]  [address] [phone]  Clinic Day:  10/19/2024  Referring physician: Neu, Ellen Louisa, NP  CHIEF COMPLAINT:  CC:  Bilateral stage IA hormone receptor positive breast cancers  Current Treatment:   Surveillance   HISTORY OF PRESENT ILLNESS:  Madison Medina is a 63 y.o. 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  We began seeing her in February.  Her disease was discovered on mammography.  There was an 8 mm mass within the right breast,which appeared suspicious, and a 6 mm indeterminate mass within the left breast, as well as an area of distortion within the left breast. 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  The right breast lesion had positive estrogen and progesterone receptors, negative HER 2 and a Ki 67 of 10%. The left breast had positive estrogen and progesterone receptors, negative HER 2 and Ki 67 of 5%.  She developed a stroke after her biopsy and was transferred to [facility], where she had a carotid endarterectomy.  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  She follows with a vascular surgeon. She finally had bilateral lumpectomies performed in March.  Pathology of the right breast revealed a 12 mm, grade 2, invasive ductal carcinoma with negative sentinel node.  Pathology of the left breast revealed a 12 mm, grade 2, invasive ductal carcinoma with negative sentinel node.  Margins were clear.  Adjuvant chemotherapy was not recommended, however, she did receive adjuvant radiation to the bilateral breasts, completed in August.    Due to her personal and family history of malignancy, she underwent testing for hereditary cancer syndromes with the [employer] myRisk hereditary cancer panel test in June.  This did not reveal any clinically significant mutation or variants of uncertain significance.  Due  to her history of stroke, we felt tamoxifen would be contraindicated due to the increased risk of thrombosis seen with it.  She had a bone density scan in September 2020, which revealed normal bone density. She was placed on anastrozole 1 mg daily in December 2020.  She had a palpable a mass in the upper outer quadrant of her right breast in late 2020, and spot tangential view of this region shows a circumscribed lucent mass measuring 1.8 cm which was consistent with benign fat necrosis.  No evidence of malignancy was identified.  She has continued to smoke 1 and half packs of cigarettes daily.  She has smoked since age 34 and at least 1 pack per day for 30 years.  We have given her information on the [place]  Quit Line.  She has COPD and sleep apnea, and uses a C-PAP mask.  Low dose CT for lung cancer screening from September 2021 revealed Lung-RADS 2, benign appearance or behavior.  CT chest in January 2022 did not reveal any evidence of pulmonary embolism or other acute abnormality.  There was a 4 mm subpleural nodule in the left lateral lung base , which was unchanged from previous CT screening. She will not need repeat CT lung cancer screening until January 2023.  She completed her anastrozole in November 2025.  INTERVAL HISTORY:  [name] is here for routine follow up for bilateral stage IA hormone receptor positive breast cancer. Patient states that she feels okay. She complains of left knee and hip pain rated 7/10. She also has a a yeast infection  under both breasts which show scaling, edema, erythema, and some desquamation. I prescribed Nystatin  powder to use 2-3 times daily until clear. She began anastrozole in December 2020, so it has now been 5 years. I instructed her to discontinue anastrozole. She had a diagnostic mammogram on 01/10/2024 which revealed no suspicious masses nor calcifications.  She had a CT lung cancer screen on 01/19/2024 which revealed clustered acinar nodules within dependent  lingula and left lower lobe, likely representing an infectious or inflammatory etiology. She had a chest x-ray on 01/25/2024 which revealed that the heart size is upper normal, central vascularity appears mildly increased, and there is consolidation in the left lingular segment. Dr. Joesph, Dr. Mardee, and her PCP all see her regularly. Her PCP will complete labs routinely. Her next mammogram is scheduled for 01/10/2025 at [facility]. I will see her back in 1 year with physical exam. She will have a bone density scan and I will schedule this. Her last one was in October of 2022, with normal spine and left forearm, and minimal osteopenia of the left femur, with a T-score of -1.2. She is currently taking oral calcium  and vitamin D daily.  She denies fever, chills, night sweats, or other signs of infection. She denies cardiorespiratory and gastrointestinal issues. Her appetite is stable and Her weight has been stable. She is accompanied by a companion.  REVIEW OF SYSTEMS:  Review of Systems  Constitutional: Negative.  Negative for appetite change, chills, diaphoresis, fatigue, fever and unexpected weight change.  HENT:  Negative.  Negative for hearing loss, lump/mass, mouth sores, nosebleeds, sore throat, tinnitus, trouble swallowing and voice change.   Eyes: Negative.  Negative for eye problems and icterus.  Respiratory: Negative.  Negative for chest tightness, cough, hemoptysis, shortness of breath and wheezing.   Cardiovascular: Negative.  Negative for chest pain, leg swelling and palpitations.  Gastrointestinal: Negative.  Negative for abdominal distention, abdominal pain, blood in stool, constipation, diarrhea, nausea, rectal pain and vomiting.  Endocrine: Negative.  Negative for hot flashes.  Genitourinary: Negative.  Negative for bladder incontinence, difficulty urinating, dyspareunia, dysuria, frequency, hematuria, menstrual problem, nocturia, pelvic pain, vaginal bleeding and vaginal discharge.    Musculoskeletal:  Positive for arthralgias (left knee and hip pain 7/10). Negative for back pain (lower back), flank pain, gait problem, myalgias, neck pain and neck stiffness.  Skin:  Positive for itching and rash. Negative for wound.       Itching and rash of both breasts with erythema and swelling.  Neurological: Negative.  Negative for dizziness, extremity weakness, gait problem, headaches, light-headedness, numbness, seizures and speech difficulty.  Hematological: Negative.  Negative for adenopathy. Does not bruise/bleed easily.  Psychiatric/Behavioral: Negative.  Negative for confusion, decreased concentration, depression, sleep disturbance and suicidal ideas. The patient is not nervous/anxious.   All other systems reviewed and are negative.   VITALS:  Blood pressure (!) 163/71, pulse 70, temperature 97.7 F (36.5 C), temperature source Oral, resp. rate 19, height 5' 4 (1.626 m), weight 296 lb 14.4 oz (134.7 kg), SpO2 95%.  Wt Readings from Last 3 Encounters:  10/19/24 296 lb 14.4 oz (134.7 kg)  08/25/24 296 lb 9.6 oz (134.5 kg)  07/19/24 287 lb (130.2 kg)    Body mass index is 50.96 kg/m.  Performance status (ECOG): 1 - Symptomatic but completely ambulatory  PHYSICAL EXAM:  Physical Exam Vitals and nursing note reviewed. Exam conducted with a chaperone present.  Constitutional:      General: She is not in acute distress.  Appearance: Normal appearance. She is normal weight. She is not ill-appearing, toxic-appearing or diaphoretic.  HENT:     Head: Normocephalic and atraumatic.     Right Ear: Tympanic membrane, ear canal and external ear normal. There is no impacted cerumen.     Left Ear: Tympanic membrane, ear canal and external ear normal. There is no impacted cerumen.     Nose: Nose normal. No congestion or rhinorrhea.     Mouth/Throat:     Mouth: Mucous membranes are moist.     Pharynx: Oropharynx is clear. No oropharyngeal exudate or posterior oropharyngeal  erythema.  Eyes:     General: No scleral icterus.       Right eye: No discharge.        Left eye: No discharge.     Extraocular Movements: Extraocular movements intact.     Conjunctiva/sclera: Conjunctivae normal.     Pupils: Pupils are equal, round, and reactive to light.  Neck:     Vascular: No carotid bruit.     Comments: Well healed scar from left endarterectomy on left neck Cardiovascular:     Rate and Rhythm: Normal rate and regular rhythm.     Pulses: Normal pulses.     Heart sounds: Normal heart sounds. No murmur heard.    No friction rub. No gallop.  Pulmonary:     Effort: Pulmonary effort is normal. No respiratory distress.     Breath sounds: Normal breath sounds. No stridor. No wheezing, rhonchi or rales.  Chest:     Chest wall: No tenderness.  Breasts:    Right: Normal.     Comments: Yeast under both breasts. Scaling, edema, and erythema is present, with some desquamation. Lymphedema of the left breast. No masses in either breast. Scar on the superior areolar complex on the right and on the left. Abdominal:     General: Bowel sounds are normal. There is no distension.     Palpations: Abdomen is soft. There is no hepatomegaly, splenomegaly or mass.     Tenderness: There is no abdominal tenderness. There is no right CVA tenderness, left CVA tenderness, guarding or rebound.     Hernia: No hernia is present.  Musculoskeletal:        General: No swelling, tenderness, deformity or signs of injury. Normal range of motion.     Cervical back: Normal range of motion and neck supple. No rigidity or tenderness.     Right lower leg: No edema.     Left lower leg: No edema.  Lymphadenopathy:     Cervical: No cervical adenopathy.  Skin:    General: Skin is warm and dry.     Coloration: Skin is not jaundiced or pale.     Findings: Erythema (of both breasts) and rash present. No bruising or lesion. Rash is scaling (of both breasts).  Neurological:     General: No focal deficit  present.     Mental Status: She is alert and oriented to person, place, and time. Mental status is at baseline.     Cranial Nerves: No cranial nerve deficit.     Sensory: No sensory deficit.     Motor: No weakness.     Coordination: Coordination normal.     Gait: Gait normal.     Deep Tendon Reflexes: Reflexes normal.  Psychiatric:        Mood and Affect: Mood normal.        Behavior: Behavior normal.        Thought Content:  Thought content normal.        Judgment: Judgment normal.    LABS:      Latest Ref Rng & Units 06/24/2022   12:00 AM 07/24/2021   12:00 AM 11/22/2020   12:00 AM  CBC  WBC  8.8     7.9  8.4      Hemoglobin 12.0 - 16.0 13.2     14.4  13.0      Hematocrit 36 - 46 41     44  39      Platelets 150 - 400 K/uL 295     311  340         This result is from an external source.      Latest Ref Rng & Units 08/25/2024    3:23 PM 06/24/2022   12:00 AM 07/24/2021   12:00 AM  CMP  Glucose 70 - 99 mg/dL 90     BUN 8 - 27 mg/dL 18  22     25    Creatinine 0.57 - 1.00 mg/dL 9.00  0.9     1.0   Sodium 134 - 144 mmol/L 136  136     138   Potassium 3.5 - 5.2 mmol/L 4.7  4.7     4.6   Chloride 96 - 106 mmol/L 97  100     100   CO2 20 - 29 mmol/L 23  30     29    Calcium  8.7 - 10.3 mg/dL 8.5  9.4     8.9   Total Protein 6.0 - 8.5 g/dL 7.8     Total Bilirubin 0.0 - 1.2 mg/dL 0.3     Alkaline Phos 44 - 121 IU/L 53  73     72   AST 0 - 40 IU/L 41  68     65   ALT 0 - 32 IU/L 25  52     45      This result is from an external source.    Ref Range & Units  09/14/2022  TSH 0.45 - 5.00 UIU/ML 6.89 High      Ref Range & Units   09/14/2022  FREE T3 2.30 - 4.20 pg/mL 2.75    Ref Range & Units  09/14/2022  FREE T4 0.6 - 1.3 NG/DL 2.8 High     STUDIES:   EXAM: 07/24/2024 CT CHEST W/O CM IMPRESSION: Coronary artery calcification is present. Previously noted tiny nodules in the lingula and left lower lobe not seen but there is persistent fibrotic scarring and a 5 mm  noncalcified nodule in the left lower lobe on axial image 92. Another six-month follow-up is recommended to assess stability. No organized infiltrate or effusion.   EXAM: 01/19/2024 CT LUNG CANCER SCREENING IMPRESSION: Clustered acinar nodules within dependent lingula and left lower lobe, likely representing an infectious or inflammatory response.  EXAM: 01/10/2024 MAM DIGITAL TOMO SCREENING B FINDINGS: No suspicious masses, areas of developing architectural distortion, or suspicious calcifications.   EXAM: 09/17/2021 DUAL X-RAY ABSORPTIOMETRY (DXA) FOR BONE MINERAL DENSITY  IMPRESSION:  Leyah Fong completed a BMD test on 09/17/2021 using the Lunar iDXA  DXA System (analysis version: 13.60) manufactured by [employer].  The following summarizes the results of our evaluation.  Technologist: [facility]  PATIENT BIOGRAPHICAL:  Name: DELENA, CASEBEER  Patient ID: [id number] [facility] Birth Date: 09-20-1961 Height: 64.0 in.  Gender: Female Exam Date: 09/17/2021 Weight: 290.0 lbs.  Indications: Tobacco User (Current Smoker) Fractures: Treatments:   ASSESSMENT:  The BMD measured at Femur Neck Left is 0.867 g/cm2 with a T-score of  -1.2. This patient is considered osteopenic according to World  Health Organization [facility]) criteria.  The scan quality is good.  Site Region Measured Measured WHO Young Adult BMD  Date Age Classification T-score   AP Spine L1-L4 09/17/2021 61.0 Normal 0.1 1.197 g/cm2  AP Spine L1-L4 09/11/2019 59.0 Normal 0.0 1.177 g/cm2   DualFemur Neck Left 09/17/2021 61.0 Osteopenia -1.2 0.867 g/cm2  DualFemur Neck Left 09/11/2019 59.0 Normal -0.7 0.940 g/cm2   DualFemur Total Mean 09/17/2021 61.0 Normal -0.1 0.991 g/cm2  DualFemur Total Mean 09/11/2019 59.0 Normal 0.1 1.020 g/cm2   Left Forearm Radius 33% 09/17/2021 61.0 Normal 0.1 0.886 g/cm2   HISTORY:   Allergies:  Allergies  Allergen Reactions   Repatha  [Evolocumab ] Other (See Comments)    Muscle Aches    Current  Medications: Current Outpatient Medications  Medication Sig Dispense Refill   polyethylene glycol powder (GLYCOLAX/MIRALAX) 17 GM/SCOOP powder SMARTSIG:17 Gram(s) By Mouth Daily PRN     ZEPBOUND 2.5 MG/0.5ML Pen SMARTSIG:0.5 Milliliter(s) SUB-Q Once a Week     albuterol (PROVENTIL) (2.5 MG/3ML) 0.083% nebulizer solution Take 2.5 mg by nebulization 3 (three) times daily as needed for wheezing or shortness of breath.     albuterol (VENTOLIN HFA) 108 (90 Base) MCG/ACT inhaler Inhale 2 puffs into the lungs every 4 (four) hours as needed for wheezing or shortness of breath.     amLODipine (NORVASC) 10 MG tablet Take 10 mg by mouth daily.     aspirin  EC 81 MG tablet Take 1 tablet (81 mg total) by mouth daily. 90 tablet 3   calcipotriene (DOVONOX) 0.005 % cream Apply 1 Application topically 2 (two) times daily.     clopidogrel (PLAVIX) 75 MG tablet Take 75 mg by mouth daily.     Fluticasone-Umeclidin-Vilant (TRELEGY ELLIPTA) 100-62.5-25 MCG/ACT AEPB Inhale 1 puff into the lungs daily.     isosorbide  mononitrate (IMDUR ) 30 MG 24 hr tablet Take 30 mg by mouth daily.     levothyroxine  (SYNTHROID ) 137 MCG tablet Take 2 tablets by mouth daily before breakfast.     lisinopril (PRINIVIL,ZESTRIL) 20 MG tablet Take 20 mg by mouth 2 (two) times daily.      nitroGLYCERIN  (NITROSTAT ) 0.4 MG SL tablet Place 1 tablet (0.4 mg total) under the tongue every 5 (five) minutes as needed for chest pain. 25 tablet 11   nystatin  (MYCOSTATIN /NYSTOP ) powder Apply 1 Application topically 3 (three) times daily. 30 g 5   omeprazole (PRILOSEC) 40 MG capsule Take 40 mg by mouth daily.     oxybutynin (DITROPAN) 5 MG tablet Take 5 mg by mouth daily.     oxyCODONE -acetaminophen  (PERCOCET) 7.5-325 MG tablet Take 1 tablet by mouth every 4 (four) hours as needed for severe pain (pain score 7-10).     rosuvastatin  (CRESTOR ) 40 MG tablet Take 1 tablet (40 mg total) by mouth daily. 90 tablet 3   traZODone (DESYREL) 150 MG tablet Take 150 mg  by mouth at bedtime.     Vitamin D, Ergocalciferol, (DRISDOL) 1.25 MG (50000 UNIT) CAPS capsule Take 50,000 Units by mouth every 7 (seven) days.     No current facility-administered medications for this visit.     ASSESSMENT & PLAN:   Assessment:  1. 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  She remains without evidence of recurrence.  She has  now completed 5 years of anastrozole and can discontinue it.   2. Tobacco abuse. We have discussed smoking cessation on multiple occasions.  Due to her 30+ pack-year history of smoking, she is undergoing annual low-dose CT chest screening for lung cancer. Currently, she is getting CT chest every 6 months to monitor pulmonary nodules through Dr. Mardee.   3.  Severe carotid arteriosclerosis requiring endarterectomy on the left.  4.  Osteopenia, minimal. She continues oral calcium  and vitamin D daily. It has been 3 years so I will schedule a new bone density scan.  Plan:   She complains of left knee and hip pain rated 7/10. She also has a a yeast infection under both breasts which show scaling, edema, erythema, and some desquamation. I prescribed Nystatin  powder to use 2-3 times daily until clear. She began anastrozole in December 2020, so it has now been 5 years. I instructed her to discontinue anastrozole. She had a diagnostic mammogram on 01/10/2024 which revealed no suspicious masses nor calcifications. She had a CT lung cancer screen on 01/19/2024 which revealed clustered acinar nodules within dependent lingula and left lower lobe, likely representing an infectious or inflammatory etiology. She had a chest x-ray on 01/25/2024 which revealed that the heart size is upper normal, central vascularity appears mildly increased, and there is consolidation in the left lingular segment. Dr. Joesph, Dr. Mardee, and her PCP all see her  regularly. Her PCP will complete labs routinely. Her next mammogram is scheduled for 01/10/2025 at [facility]. I will see her back in 1 year with physical exam. She is due for a bone density scan and I will schedule this. Her last one was in October of 2022, with normal spine and left forearm, and minimal osteopenia of the left femur, with a T-score of -1.2. She is currently taking oral calcium  and vitamin D daily. The patient understands the plans discussed today and is in agreement with them.  She knows to contact our office if she develops concerns prior to her next appointment.  I provided 18 minutes of face-to-face time during this this encounter and > 50% was spent counseling as documented under my assessment and plan.   I have reviewed this report as typed by the medical scribe, and it is complete and accurate.

## 2024-10-24 ENCOUNTER — Other Ambulatory Visit: Payer: Self-pay | Admitting: Oncology

## 2024-10-24 DIAGNOSIS — C50411 Malignant neoplasm of upper-outer quadrant of right female breast: Secondary | ICD-10-CM

## 2024-10-24 DIAGNOSIS — Z170 Estrogen receptor positive status [ER+]: Secondary | ICD-10-CM

## 2024-11-02 ENCOUNTER — Ambulatory Visit (HOSPITAL_BASED_OUTPATIENT_CLINIC_OR_DEPARTMENT_OTHER)
Admission: RE | Admit: 2024-11-02 | Discharge: 2024-11-02 | Disposition: A | Source: Ambulatory Visit | Attending: Oncology | Admitting: Oncology

## 2024-11-02 DIAGNOSIS — Z780 Asymptomatic menopausal state: Secondary | ICD-10-CM

## 2024-11-02 DIAGNOSIS — M8580 Other specified disorders of bone density and structure, unspecified site: Secondary | ICD-10-CM

## 2024-11-23 ENCOUNTER — Other Ambulatory Visit: Payer: Self-pay | Admitting: Oncology

## 2024-11-23 DIAGNOSIS — C50411 Malignant neoplasm of upper-outer quadrant of right female breast: Secondary | ICD-10-CM

## 2024-11-23 DIAGNOSIS — Z170 Estrogen receptor positive status [ER+]: Secondary | ICD-10-CM

## 2024-11-23 NOTE — Telephone Encounter (Signed)
 Has completed course of 5 years

## 2024-11-24 ENCOUNTER — Telehealth: Payer: Self-pay

## 2024-11-24 NOTE — Telephone Encounter (Signed)
-----   Message from Madison Cornish, MD sent at 11/23/2024  6:34 PM EST ----- Regarding: call Got another request to refill her anastrozole, I denied Rx since she has completed 5 years, let her know

## 2024-11-29 NOTE — Progress Notes (Unsigned)
 Cardiology Office Note:    Date:  11/30/2024   ID:  Madison Medina, DOB Jan 24, 1961, MRN [id number]  PCP:  Servando Leeroy Ground, NP  Cardiologist:  Redell Leiter, MD    Referring MD: Servando Leeroy Ground, NP    ASSESSMENT:    1. Homozygous familial hypercholesterolemia   2. Coronary artery disease involving native heart, unspecified vessel or lesion type, unspecified whether angina present   3. Essential hypertension   4. Bilateral carotid artery stenosis   5. OSA (obstructive sleep apnea)    PLAN:    In order of problems listed above:  She clearly has familial hyperlipidemia I suspect she is a statin nonresponder with a LDL receptor defect she would benefit from seeing lipid clinic and I think that inclisiran would be appropriate referral made Recheck full labs including panel LP(a) and ApoB Stable CAD having no anginal discomfort very high risk of recurrent cardiac events continue her dual antiplatelet therapy oral nitrate and statin along with as needed nitroglycerin  Followed by vascular surgery Now on Zepbound should be beneficial for multiple problems including her obstructive sleep apnea   Next appointment: 1 year   Medication Adjustments/Labs and Tests Ordered: Current medicines are reviewed at length with the patient today.  Concerns regarding medicines are outlined above.  No orders of the defined types were placed in this encounter.  No orders of the defined types were placed in this encounter.    History of Present Illness:    Madison Medina is a 63 y.o. female with a hx of familial hyperlipidemia carotid artery disease s/p left CEA 2020, nonobstructive CAD, history of breast cancer s/p radiation, palpitations, hypertension, history of stroke, history of tobacco abuse, OSA on CPAP last seen by me 08/21/2022 and multiple visits with Delon Hoover NP most recently 08/25/2024 after lung cancer screening CT showed CAC.Madison Medina  She had an Echo 09/20/2024: 1. Left ventricular  ejection fraction, by estimation, is 60 to 65%. The  left ventricle has normal function. Left ventricular endocardial border  not optimally defined to evaluate regional wall motion. indeterminate left  ventricular hypertrophy. Left  ventricular diastolic parameters were normal.   2. Right ventricular systolic function is normal. The right ventricular  size is normal.   3. Left atrial size was mildly dilated.   4. The mitral valve is normal in structure. No evidence of mitral valve  regurgitation. No evidence of mitral stenosis.   5. The aortic valve was not well visualized. Aortic valve regurgitation  is not visualized. No aortic stenosis is present.   Compliance with diet, lifestyle and medications: Yes  She has been on Zepbound now for 2 months I encouraged her to continue with it She is taking a high intensity statin and her LDL is 236 non-HDL cholesterol 273 total cholesterol 307 consistent with familial hyperlipidemia Very strong family history of CAD mother siblings several died and sons 1 of whom had a recent heart attack She tells me she tried injectable PCSK9 and could not tolerate in the past  Fortunately not having angina edema shortness of breath palpitation or syncope She follows with vascular surgery for her carotid disease Past Medical History:  Diagnosis Date   Abnormal glucose 07/09/2016   Acute pharyngitis 10/29/2016   AKI (acute kidney injury) 12/30/2018   Anxiety 10/12/2016   Arthritis, degenerative 07/09/2016   Asthma    Back pain    Bilateral breast cancer (HCC) 11/22/2019   Breast cancer in female [facility]) 12/28/2018   Breast cancer  of upper-outer quadrant of left female breast (HCC) 12/28/2018   CAD (coronary artery disease) 01/03/2019   Carotid artery occlusion    COPD (chronic obstructive pulmonary disease) (HCC)    COPD exacerbation (HCC) 02/03/2016   CVA (cerebral vascular accident) (HCC) 12/30/2018   Essential hypertension 10/23/2016   Familial  combined hyperlipidemia 07/09/2016   HTN (hypertension) 12/30/2018   Hyperlipidemia    Hyponatremia 12/30/2018   Hypothyroid 02/03/2016   Hypothyroidism    Insomnia 07/09/2016   Morbid obesity with BMI of 45.0-49.9, adult (HCC) 11/22/2019   Nonobstructive atherosclerosis of coronary artery 08/03/2019   Oral thrush 10/29/2016   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  WEARS CPAP EVERYNIGHT     Stroke (HCC)    Tobacco use 07/09/2016   Tremor of right hand 07/09/2016    Current Medications: Active Medications[1]    EKGs/Labs/Other Studies Reviewed:    The following studies were reviewed today:  Cardiac Studies & Procedures   ______________________________________________________________________________________________ CARDIAC CATHETERIZATION  CARDIAC CATHETERIZATION 01/03/2019  Conclusion  Widely patent left main  LAD is tortuous and contains segmental mid vessel 40 to 50% narrowing.  No focal high-grade obstruction is noted.  Second diagonal is large and also free of significant obstruction.  A large ramus intermedius branch contains mid vessel irregularity but no high-grade obstruction.  The circumflex system is relatively small in distribution.  No significant obstruction.  The right coronary is dominant.  Diffuse irregularities up to 40% from proximal to distal vessel.  PDA contains eccentric 60% narrowing in the mid third.  LVEF greater than 60% with EDP greater than 25 to 30 mmHg.  RECOMMENDATIONS:   Angiographic images of the heart did not identify a focal or significant obstructive lesion.  Therefore, therapy should be with medication/risk factor modification.  Proceed with the planned carotid surgical procedure.  Findings Coronary Findings Diagnostic  Dominance: Right  Left Anterior Descending Prox LAD lesion is 40% stenosed.  First Diagonal Branch Vessel is small  in size.  First Septal Branch Vessel is small in size.  Ramus Intermedius Ramus lesion is 25% stenosed.  Right Coronary Artery Prox RCA to Mid RCA lesion is 40% stenosed.  Right Posterior Descending Artery RPDA lesion is 60% stenosed.  Intervention  No interventions have been documented.     ECHOCARDIOGRAM  ECHOCARDIOGRAM COMPLETE 09/20/2024  Narrative ECHOCARDIOGRAM REPORT    Patient Name:   PRISCILLA KIRSTEIN Date of Exam: 09/20/2024 Medical Rec #:  [id number]     Height:       64.0 in Accession #:    [id number]    Weight:       296.6 lb Date of Birth:  1961/12/11     BSA:          2.313 m Patient Age:    63 years      BP:           150/82 mmHg Patient Gender: F             HR:           81 bpm. Exam Location:  [place]  Procedure: 2D Echo, Cardiac Doppler, Color Doppler and Strain Analysis (Both Spectral and Color Flow Doppler were utilized during procedure).  Indications:    Coronary artery disease involving native heart, unspecified vessel or lesion type, unspecified whether angina present [I25.10 (ICD-10-CM)], Bilateral carotid artery stenosis [I65.23 (ICD-10-CM)], Essential hypertension [I10 (ICD-10-CM)], Mixed hyperlipidemia [E78.2 (  ICD-10-CM)], DOE (dyspnea on exertion) [R06.09 (ICD-10-CM)]  History:        Patient has prior history of Echocardiogram examinations, most recent 10/06/2021. CAD, Stroke, Signs/Symptoms:Shortness of Breath; Risk Factors:Hypertension, Former Smoker, Diabetes and Dyslipidemia.  Sonographer:    Charlie Jointer RDCS Referring Phys: [phone] DELON JAYSON HOOVER   Sonographer Comments: Technically difficult study due to poor echo windows, suboptimal parasternal window and suboptimal apical window. Image acquisition challenging due to patient body habitus. IMPRESSIONS   1. Left ventricular ejection fraction, by estimation, is 60 to 65%. The left ventricle has normal function. Left ventricular endocardial border not optimally defined to  evaluate regional wall motion. indeterminate left ventricular hypertrophy. Left ventricular diastolic parameters were normal. 2. Right ventricular systolic function is normal. The right ventricular size is normal. 3. Left atrial size was mildly dilated. 4. The mitral valve is normal in structure. No evidence of mitral valve regurgitation. No evidence of mitral stenosis. 5. The aortic valve was not well visualized. Aortic valve regurgitation is not visualized. No aortic stenosis is present.  FINDINGS Left Ventricle: Left ventricular ejection fraction, by estimation, is 60 to 65%. The left ventricle has normal function. Left ventricular endocardial border not optimally defined to evaluate regional wall motion. Global longitudinal strain performed but not reported based on interpreter judgement due to suboptimal tracking. The left ventricular internal cavity size was normal in size. Indeterminate left ventricular hypertrophy. Left ventricular diastolic parameters were normal. Normal left ventricular filling pressure.  Right Ventricle: The right ventricular size is normal. Right vetricular wall thickness was not assessed. Right ventricular systolic function is normal.  Left Atrium: Left atrial size was mildly dilated.  Right Atrium: Right atrial size was not well visualized.  Pericardium: There is no evidence of pericardial effusion.  Mitral Valve: The mitral valve is normal in structure. No evidence of mitral valve regurgitation. No evidence of mitral valve stenosis.  Tricuspid Valve: The tricuspid valve is not well visualized. Tricuspid valve regurgitation is not demonstrated. No evidence of tricuspid stenosis.  Aortic Valve: The aortic valve was not well visualized. Aortic valve regurgitation is not visualized. No aortic stenosis is present.  Pulmonic Valve: The pulmonic valve was not well visualized. Pulmonic valve regurgitation is not visualized. No evidence of pulmonic stenosis.  Aorta:  The aortic arch was not well visualized and the aortic root and ascending aorta are structurally normal, with no evidence of dilitation.  Venous: The pulmonary veins were not well visualized. The inferior vena cava was not well visualized.  IAS/Shunts: The interatrial septum was not assessed.   IVC IVC diam: 1.90 cm  LEFT ATRIUM           Index LA Vol (A4C): 95.7 ml 41.37 ml/m  AORTA Ao Asc diam:  3.60 cm Ao Desc diam: 2.30 cm  MITRAL VALVE MV Area (PHT): 2.14 cm MV Decel Time: 354 msec MV E velocity: 58.67 cm/s  Redell Leiter MD Electronically signed by Redell Leiter MD Signature Date/Time: 09/20/2024/12:50:56 PM    Final    MONITORS  CARDIAC EVENT MONITOR 07/05/2019  Narrative And event monitor was performed for 30 days beginning 06/02/2019 to evaluate syncope.  Baseline rhythm was sinus 93 bpm normal conduction.  The minimum average and maximum heart rates 51, 79 and 116 bpm.  There were no pauses or episodes of sinus node or AV block.  There was no ventricular or supraventricular arrhythmia.  Therefore stable events dizziness shortness of breath and fatigue all associated with sinus rhythm.   Conclusion unremarkable  30-day event monitor   CT SCANS  CT CORONARY FRACTIONAL FLOW RESERVE DATA PREP 01/02/2019  Narrative EXAM: CT FFR ANALYSIS  CLINICAL DATA:  63 year old obese patient with carotid disease and DOE.  FINDINGS: FFRct analysis was performed on the original cardiac CT angiogram dataset. Diagrammatic representation of the FFRct analysis is provided in a separate PDF document in PACS. This dictation was created using the PDF document and an interactive 3D model of the results. 3D model is not available in the EMR/PACS. Normal FFR range is >0.80.  1. Left Main:  No significant stenosis.  2. LAD: Proximal: 0.91, mid 0.81, distal: 0.8. 3. LCX: No significant stenosis. 4. RCA: No significant stenosis.  IMPRESSION: 1. CT FFR showed  borderline stenosis in the proximal to mid LAD. Given that is it proximal LAD a cardiac catheterization is recommended.   Electronically Signed By: Leim Moose On: 01/03/2019 09:12   CT CORONARY MORPH W/CTA COR W/SCORE 01/02/2019  Addendum 01/02/2019  5:56 PM ADDENDUM REPORT: 01/02/2019 17:53  CLINICAL DATA:  63 year old female with h/o carotid disease, morbid obesity and DOE.  EXAM: Cardiac/Coronary  CT  TECHNIQUE: The patient was scanned on a [employer].  FINDINGS: A 120 kV prospective scan was triggered in the descending thoracic aorta at 111 HU's. Axial non-contrast 3 mm slices were carried out through the heart. The data set was analyzed on a dedicated work station and scored using the Agatson method. Gantry rotation speed was 250 msecs and collimation was .6 mm. No beta blockade and 0.8 mg of sl NTG was given. The 3D data set was reconstructed in 5% intervals of the 67-82 % of the R-R cycle. Diastolic phases were analyzed on a dedicated work station using MPR, MIP and VRT modes. The patient received 80 cc of contrast.  Aorta: Normal size. Mild diffuse atherosclerotic plaque and calcifications. No dissection.  Aortic Valve:  Trileaflet.  No calcifications.  Coronary Arteries:  Normal coronary origin.  Right dominance.  RCA is a large dominant artery that gives rise to PDA and PLA. There is moderate diffuse mixed plaque with focal stenoses 50-60% in the mid and distal RCA, proximal PLA and mid PDA.  Left main is a large artery that gives rise to LAD, a ramus intermedius and LCX arteries. Left main has no obvious plaque.  LAD is a large vessel that gives rise to two small diagonal arteries. Proximal LAD has long diffuse moderate calcified plaque with stenosis 50-69%. This is followed by a non-calcified plaque in the mid LAD with suspicion for stenosis > 70%. Distal LAD has only mild plaque.  D1,2 are small with mild plaque.  Ramus intermedius  is a medium size artery with moderate calcified plaque in the proximal portion with stenosis 50-69%.  LCX is a small caliber non-dominant artery that has mild diffuse plaque.  Other findings:  Normal pulmonary vein drainage into the left atrium.  Normal let atrial appendage without a thrombus.  Normal size of the pulmonary artery.  IMPRESSION: 1. Coronary calcium  score of 719. This was 60 percentile for age and sex matched control.  2. Normal coronary origin with right dominance.  3. Moderate to severe CAD with moderate stenosis in the mid and distal RCA, proximal PLA, mid PDA, proximal LAD and suspicion for severe stenosis in the mid LAD. Additional analysis with CT FFR will be submitted.   Electronically Signed By: Leim Moose On: 01/02/2019 17:53  Narrative EXAM: OVER-READ INTERPRETATION  CT CHEST  The following report  is an over-read performed by radiologist Dr. Rockey Kilts of [facility] on 01/02/2019. This over-read does not include interpretation of cardiac or coronary anatomy or pathology. The coronary CTA interpretation by the cardiologist is attached.  COMPARISON:  Chest radiograph 12/29/2018. Complete chest CT of 06/04/2011.  FINDINGS: Vascular: Aortic atherosclerosis. No central pulmonary embolism, on this non-dedicated study.  Mediastinum/Nodes: No imaged thoracic adenopathy.  Lungs/Pleura: No imaged pleural fluid. Probable scarring at the left lung base. An area of nodularity including image 48/14 is similar (given differences in technique) to the 06/04/2011 exam and can be presumed benign.  Upper Abdomen: Normal imaged portions of the liver, spleen, stomach.  Musculoskeletal: No acute osseous abnormality.  IMPRESSION: 1.  No acute findings in the imaged extracardiac chest. 2.  Aortic Atherosclerosis (ICD10-I70.0).  Electronically Signed: By: Rockey Kilts M.D. On: 01/02/2019 14:50      ______________________________________________________________________________________________          Recent Labs: 08/25/2024: ALT 25; BUN 18; Creatinine, Ser 0.99; NT-Pro BNP 103; Potassium 4.7; Sodium 136   Physical Exam:    VS:  BP 138/68   Pulse 84   Ht 5' 4 (1.626 m)   Wt 293 lb 9.6 oz (133.2 kg)   SpO2 93%   BMI 50.40 kg/m     Wt Readings from Last 3 Encounters:  11/30/24 293 lb 9.6 oz (133.2 kg)  10/19/24 296 lb 14.4 oz (134.7 kg)  08/25/24 296 lb 9.6 oz (134.5 kg)     GEN: Quite obese BMI exceeds 50 well nourished, well developed in no acute distress surprisingly no xanthelasma xanthoma HEENT: Normal NECK: No JVD; No carotid bruits LYMPHATICS: No lymphadenopathy CARDIAC: RRR, no murmurs, rubs, gallops RESPIRATORY:  Clear to auscultation without rales, wheezing or rhonchi  ABDOMEN: Soft, non-tender, non-distended MUSCULOSKELETAL:  No edema; No deformity  SKIN: Warm and dry NEUROLOGIC:  Alert and oriented x 3 PSYCHIATRIC:  Normal affect    Signed, Redell Leiter, MD  11/30/2024 10:22 AM    [facility]      [1]  Current Meds  Medication Sig   albuterol (PROVENTIL) (2.5 MG/3ML) 0.083% nebulizer solution Take 2.5 mg by nebulization 3 (three) times daily as needed for wheezing or shortness of breath.   albuterol (VENTOLIN HFA) 108 (90 Base) MCG/ACT inhaler Inhale 2 puffs into the lungs every 4 (four) hours as needed for wheezing or shortness of breath.   amLODipine (NORVASC) 10 MG tablet Take 10 mg by mouth daily.   aspirin  EC 81 MG tablet Take 1 tablet (81 mg total) by mouth daily.   calcipotriene (DOVONOX) 0.005 % cream Apply 1 Application topically 2 (two) times daily.   clopidogrel (PLAVIX) 75 MG tablet Take 75 mg by mouth daily.   Fluticasone-Umeclidin-Vilant (TRELEGY ELLIPTA) 100-62.5-25 MCG/ACT AEPB Inhale 1 puff into the lungs daily.   isosorbide  mononitrate (IMDUR ) 30 MG 24 hr tablet Take 30 mg by mouth daily.    levothyroxine  (SYNTHROID ) 137 MCG tablet Take 2 tablets by mouth daily before breakfast.   lisinopril (PRINIVIL,ZESTRIL) 20 MG tablet Take 20 mg by mouth 2 (two) times daily.    nitroGLYCERIN  (NITROSTAT ) 0.4 MG SL tablet Place 1 tablet (0.4 mg total) under the tongue every 5 (five) minutes as needed for chest pain.   nystatin  (MYCOSTATIN /NYSTOP ) powder Apply 1 Application topically 3 (three) times daily.   omeprazole (PRILOSEC) 40 MG capsule Take 40 mg by mouth daily.   oxybutynin (DITROPAN) 5 MG tablet Take 5 mg by mouth daily.  oxyCODONE -acetaminophen  (PERCOCET) 7.5-325 MG tablet Take 1 tablet by mouth every 4 (four) hours as needed for severe pain (pain score 7-10).   polyethylene glycol powder (GLYCOLAX/MIRALAX) 17 GM/SCOOP powder SMARTSIG:17 Gram(s) By Mouth Daily PRN   rosuvastatin  (CRESTOR ) 40 MG tablet Take 1 tablet (40 mg total) by mouth daily.   traZODone (DESYREL) 150 MG tablet Take 150 mg by mouth at bedtime.   Vitamin D, Ergocalciferol, (DRISDOL) 1.25 MG (50000 UNIT) CAPS capsule Take 50,000 Units by mouth every 7 (seven) days.   ZEPBOUND 5 MG/0.5ML Pen Inject 5 mg into the skin once a week.

## 2024-11-30 ENCOUNTER — Encounter: Payer: Self-pay | Admitting: Cardiology

## 2024-11-30 ENCOUNTER — Ambulatory Visit: Attending: Cardiology | Admitting: Cardiology

## 2024-11-30 VITALS — BP 138/68 | HR 84 | Ht 64.0 in | Wt 293.6 lb

## 2024-11-30 DIAGNOSIS — G4733 Obstructive sleep apnea (adult) (pediatric): Secondary | ICD-10-CM | POA: Diagnosis not present

## 2024-11-30 DIAGNOSIS — I6523 Occlusion and stenosis of bilateral carotid arteries: Secondary | ICD-10-CM

## 2024-11-30 DIAGNOSIS — I2510 Atherosclerotic heart disease of native coronary artery without angina pectoris: Secondary | ICD-10-CM | POA: Diagnosis not present

## 2024-11-30 DIAGNOSIS — E7801 Homozygous familial hypercholesterolemia (hofh): Secondary | ICD-10-CM | POA: Diagnosis not present

## 2024-11-30 DIAGNOSIS — I10 Essential (primary) hypertension: Secondary | ICD-10-CM

## 2024-11-30 NOTE — Patient Instructions (Signed)
 Medication Instructions:  Your physician recommends that you continue on your current medications as directed. Please refer to the Current Medication list given to you today.  *If you need a refill on your cardiac medications before your next appointment, please call your pharmacy*  Lab Work: Your physician recommends that you return for lab work in:   Labs today: CMP, Lipids, Lpa, Apo B  If you have labs (blood work) drawn today and your tests are completely normal, you will receive your results only by: MyChart Message (if you have MyChart) OR A paper copy in the mail If you have any lab test that is abnormal or we need to change your treatment, we will call you to review the results.  Testing/Procedures: None  Follow-Up: At [facility], you and your health needs are our priority.  As part of our continuing mission to provide you with exceptional heart care, our providers are all part of one team.  This team includes your primary Cardiologist (physician) and Advanced Practice Providers or APPs (Physician Assistants and Nurse Practitioners) who all work together to provide you with the care you need, when you need it.  Your next appointment:   6 month(s)  Provider:   Redell Leiter, MD    We recommend signing up for the patient portal called MyChart.  Sign up information is provided on this After Visit Summary.  MyChart is used to connect with patients for Virtual Visits (Telemedicine).  Patients are able to view lab/test results, encounter notes, upcoming appointments, etc.  Non-urgent messages can be sent to your provider as well.   To learn more about what you can do with MyChart, go to [email or web address].   Other Instructions Please keep a BP log for 2 weeks and send by MyChart or mail.                      Dr. Leiter [address] [place], [place] [address]  Blood Pressure Record Sheet To take your blood pressure, you will need a blood pressure machine. You can  buy a blood pressure machine (blood pressure monitor) at your clinic, drug store, or online. When choosing one, consider: An automatic monitor that has an arm cuff. A cuff that wraps snugly around your upper arm. You should be able to fit only one finger between your arm and the cuff. A device that stores blood pressure reading results. Do not choose a monitor that measures your blood pressure from your wrist or finger. Follow your health care provider's instructions for how to take your blood pressure. To use this form: Get one reading in the morning (a.m.) 1-2 hours after you take any medicines. Get one reading in the evening (p.m.) before supper.   Blood pressure log Date: _______________________  a.m. _____________________(1st reading) HR___________            p.m. _____________________(2nd reading) HR__________  Date: _______________________  a.m. _____________________(1st reading) HR___________            p.m. _____________________(2nd reading) HR__________  Date: _______________________  a.m. _____________________(1st reading) HR___________            p.m. _____________________(2nd reading) HR__________  Date: _______________________  a.m. _____________________(1st reading) HR___________            p.m. _____________________(2nd reading) HR__________  Date: _______________________  a.m. _____________________(1st reading) HR___________            p.m. _____________________(2nd reading) HR__________  Date: _______________________  a.m. _____________________(1st reading) HR___________  p.m. _____________________(2nd reading) HR__________  Date: _______________________  a.m. _____________________(1st reading) HR___________            p.m. _____________________(2nd reading) HR__________   This information is not intended to replace advice given to you by your health care provider. Make sure you discuss any questions you have with your health care  provider. Document Revised: 03/20/2020 Document Reviewed: 03/20/2020 Elsevier Patient Education  2021 [employer].

## 2024-11-30 NOTE — Addendum Note (Signed)
 Addended by: SHERRE ADE I on: 11/30/2024 10:40 AM   Modules accepted: Orders

## 2024-12-01 ENCOUNTER — Ambulatory Visit: Payer: Self-pay | Admitting: Cardiology

## 2024-12-01 LAB — COMPREHENSIVE METABOLIC PANEL WITH GFR
ALT: 43 IU/L — ABNORMAL HIGH (ref 0–32)
AST: 77 IU/L — ABNORMAL HIGH (ref 0–40)
Albumin: 3.8 g/dL — ABNORMAL LOW (ref 3.9–4.9)
Alkaline Phosphatase: 49 IU/L (ref 49–135)
BUN/Creatinine Ratio: 19 (ref 12–28)
BUN: 18 mg/dL (ref 8–27)
Bilirubin Total: 0.4 mg/dL (ref 0.0–1.2)
CO2: 23 mmol/L (ref 20–29)
Calcium: 9.3 mg/dL (ref 8.7–10.3)
Chloride: 98 mmol/L (ref 96–106)
Creatinine, Ser: 0.94 mg/dL (ref 0.57–1.00)
Globulin, Total: 3.1 g/dL (ref 1.5–4.5)
Glucose: 113 mg/dL — ABNORMAL HIGH (ref 70–99)
Potassium: 4.6 mmol/L (ref 3.5–5.2)
Sodium: 138 mmol/L (ref 134–144)
Total Protein: 6.9 g/dL (ref 6.0–8.5)
eGFR: 68 mL/min/1.73

## 2024-12-01 LAB — APOLIPOPROTEIN B: Apolipoprotein B: 148 mg/dL — ABNORMAL HIGH

## 2024-12-01 LAB — LIPID PANEL
Chol/HDL Ratio: 8.1 ratio — ABNORMAL HIGH (ref 0.0–4.4)
Cholesterol, Total: 266 mg/dL — ABNORMAL HIGH (ref 100–199)
HDL: 33 mg/dL — ABNORMAL LOW
LDL Chol Calc (NIH): 201 mg/dL — ABNORMAL HIGH (ref 0–99)
Triglycerides: 168 mg/dL — ABNORMAL HIGH (ref 0–149)
VLDL Cholesterol Cal: 32 mg/dL (ref 5–40)

## 2024-12-01 LAB — LIPOPROTEIN A (LPA): Lipoprotein (a): 120.1 nmol/L — ABNORMAL HIGH

## 2024-12-04 ENCOUNTER — Other Ambulatory Visit: Payer: Self-pay

## 2024-12-04 DIAGNOSIS — K760 Fatty (change of) liver, not elsewhere classified: Secondary | ICD-10-CM

## 2024-12-11 ENCOUNTER — Inpatient Hospital Stay (HOSPITAL_BASED_OUTPATIENT_CLINIC_OR_DEPARTMENT_OTHER): Admission: RE | Admit: 2024-12-11 | Source: Ambulatory Visit | Admitting: Radiology

## 2024-12-22 ENCOUNTER — Ambulatory Visit (INDEPENDENT_AMBULATORY_CARE_PROVIDER_SITE_OTHER)
Admission: RE | Admit: 2024-12-22 | Discharge: 2024-12-22 | Disposition: A | Discharge status: Home / Self Care | Source: Ambulatory Visit | Attending: Cardiology | Admitting: Cardiology

## 2024-12-22 DIAGNOSIS — K760 Fatty (change of) liver, not elsewhere classified: Secondary | ICD-10-CM | POA: Diagnosis not present

## 2024-12-25 ENCOUNTER — Other Ambulatory Visit: Payer: Self-pay | Admitting: Oncology

## 2024-12-25 ENCOUNTER — Ambulatory Visit: Payer: Self-pay

## 2024-12-25 DIAGNOSIS — C50411 Malignant neoplasm of upper-outer quadrant of right female breast: Secondary | ICD-10-CM

## 2024-12-25 DIAGNOSIS — Z170 Estrogen receptor positive status [ER+]: Secondary | ICD-10-CM

## 2024-12-27 ENCOUNTER — Telehealth: Payer: Self-pay

## 2024-12-27 NOTE — Telephone Encounter (Signed)
 Attempting to contact patient. NO answer

## 2024-12-27 NOTE — Telephone Encounter (Signed)
-----   Message from Wanda Cornish, MD sent at 12/26/2024  7:01 AM EST ----- Regarding: NO refill I keep getting requests to refill the anastrozole but she has completed her 5 years, doesn't need to continue

## 2025-01-24 ENCOUNTER — Institutional Professional Consult (permissible substitution) (HOSPITAL_BASED_OUTPATIENT_CLINIC_OR_DEPARTMENT_OTHER): Admitting: Internal Medicine

## 2025-10-19 ENCOUNTER — Inpatient Hospital Stay: Admitting: Oncology
